# Patient Record
Sex: Male | Born: 1945
Health system: Southern US, Community
[De-identification: ages and names within clinical notes are randomized; demographics above are authoritative.]

## PROBLEM LIST (undated history)

## (undated) DIAGNOSIS — I998 Other disorder of circulatory system: Secondary | ICD-10-CM

## (undated) DIAGNOSIS — F419 Anxiety disorder, unspecified: Secondary | ICD-10-CM

## (undated) DIAGNOSIS — D649 Anemia, unspecified: Secondary | ICD-10-CM

## (undated) DIAGNOSIS — G629 Polyneuropathy, unspecified: Secondary | ICD-10-CM

## (undated) DIAGNOSIS — E119 Type 2 diabetes mellitus without complications: Secondary | ICD-10-CM

## (undated) DIAGNOSIS — I96 Gangrene, not elsewhere classified: Secondary | ICD-10-CM

## (undated) DIAGNOSIS — I10 Essential (primary) hypertension: Secondary | ICD-10-CM

## (undated) DIAGNOSIS — E785 Hyperlipidemia, unspecified: Secondary | ICD-10-CM

## (undated) DIAGNOSIS — I70229 Atherosclerosis of native arteries of extremities with rest pain, unspecified extremity: Secondary | ICD-10-CM

## (undated) DIAGNOSIS — C801 Malignant (primary) neoplasm, unspecified: Secondary | ICD-10-CM

## (undated) HISTORY — DX: Other disorder of circulatory system: I99.8

## (undated) HISTORY — DX: Essential (primary) hypertension: I10

## (undated) HISTORY — PX: ORIF HUMERUS FRACTURE: SHX2126

## (undated) HISTORY — PX: CARDIAC ELECTROPHYSIOLOGY STUDY AND ABLATION: SHX1294

## (undated) HISTORY — PX: CHOLECYSTECTOMY: SHX55

## (undated) HISTORY — DX: Gangrene, not elsewhere classified: I96

## (undated) HISTORY — DX: Type 2 diabetes mellitus without complications: E11.9

## (undated) HISTORY — DX: Malignant (primary) neoplasm, unspecified: C80.1

## (undated) HISTORY — DX: Hyperlipidemia, unspecified: E78.5

## (undated) HISTORY — DX: Atherosclerosis of native arteries of extremities with rest pain, unspecified extremity: I70.229

---

## 2011-11-04 DIAGNOSIS — C61 Malignant neoplasm of prostate: Secondary | ICD-10-CM | POA: Insufficient documentation

## 2011-11-04 DIAGNOSIS — N401 Enlarged prostate with lower urinary tract symptoms: Secondary | ICD-10-CM | POA: Insufficient documentation

## 2011-11-04 DIAGNOSIS — R310 Gross hematuria: Secondary | ICD-10-CM | POA: Insufficient documentation

## 2011-11-04 DIAGNOSIS — N281 Cyst of kidney, acquired: Secondary | ICD-10-CM | POA: Insufficient documentation

## 2013-11-09 DIAGNOSIS — I4892 Unspecified atrial flutter: Secondary | ICD-10-CM | POA: Insufficient documentation

## 2013-11-09 DIAGNOSIS — R0602 Shortness of breath: Secondary | ICD-10-CM | POA: Insufficient documentation

## 2014-01-04 ENCOUNTER — Ambulatory Visit: Payer: Self-pay | Admitting: Cardiovascular Disease

## 2014-01-31 ENCOUNTER — Ambulatory Visit: Payer: Self-pay | Admitting: Cardiovascular Disease

## 2014-12-19 DIAGNOSIS — I96 Gangrene, not elsewhere classified: Secondary | ICD-10-CM

## 2014-12-19 HISTORY — DX: Gangrene, not elsewhere classified: I96

## 2015-01-02 ENCOUNTER — Telehealth: Payer: Self-pay | Admitting: Cardiovascular Disease

## 2015-01-03 ENCOUNTER — Ambulatory Visit: Payer: Self-pay | Admitting: Cardiovascular Disease

## 2015-01-08 NOTE — Telephone Encounter (Signed)
Closed encounter °

## 2015-01-09 ENCOUNTER — Encounter: Payer: Self-pay | Admitting: Cardiovascular Disease

## 2015-01-09 ENCOUNTER — Ambulatory Visit (INDEPENDENT_AMBULATORY_CARE_PROVIDER_SITE_OTHER): Payer: Medicare Other | Admitting: Cardiovascular Disease

## 2015-01-09 VITALS — BP 116/60 | HR 92 | Ht 67.0 in | Wt 221.0 lb

## 2015-01-09 DIAGNOSIS — I70229 Atherosclerosis of native arteries of extremities with rest pain, unspecified extremity: Secondary | ICD-10-CM

## 2015-01-09 DIAGNOSIS — Z7982 Long term (current) use of aspirin: Secondary | ICD-10-CM

## 2015-01-09 DIAGNOSIS — I998 Other disorder of circulatory system: Secondary | ICD-10-CM

## 2015-01-09 DIAGNOSIS — Z79899 Other long term (current) drug therapy: Secondary | ICD-10-CM

## 2015-01-09 DIAGNOSIS — I10 Essential (primary) hypertension: Secondary | ICD-10-CM

## 2015-01-09 DIAGNOSIS — E785 Hyperlipidemia, unspecified: Secondary | ICD-10-CM

## 2015-01-09 DIAGNOSIS — D689 Coagulation defect, unspecified: Secondary | ICD-10-CM

## 2015-01-09 NOTE — Patient Instructions (Signed)
Dr. Gwenlyn Found has ordered a peripheral angiogram to be done at [facility] (thursday, 3/24).  This procedure is going to look at the bloodflow in your lower extremities.  If Dr. Gwenlyn Found is able to open up the arteries, you will have to spend one night in the hospital.  If he is not able to open the arteries, you will be able to go home that same day.    After the procedure, you will not be allowed to drive for 3 days or push, pull, or lift anything greater than 10 lbs for one week.    You will be required to have the following tests prior to the procedure:  1. Blood work-the blood work can be done no more than 7 days prior to the procedure.  It can be done at any [facility] lab.  There is one downstairs on the first floor of this building and one in the [place] ([address])  Reps: TBA  lower extremity arterial doppler (to be done tomorrow, March 23)- During this test, ultrasound is used to evaluate arterial blood flow in the legs. Allow approximately one hour for this exam.

## 2015-01-09 NOTE — Assessment & Plan Note (Signed)
History of hyperlipidemia on some statin 40 mg a day followed by his PCP 

## 2015-01-09 NOTE — Assessment & Plan Note (Signed)
History of hypertension blood pressure measured at 116/60. He is on chlorthalidone, losartan and metoprolol. Continue current meds at current dosing

## 2015-01-09 NOTE — Assessment & Plan Note (Signed)
History of critical limb ischemia with ulcer beginning on his left great toe possibly 2 months ago and his right fifth toe. He was referred by Dr. Barkley Bruns from [facility] for peripheral vascular evaluation. I'm going to get arterial Dopplers today and arrange for him to undergo angiography and potential intervention for limb salvage on Thursday

## 2015-01-09 NOTE — Progress Notes (Signed)
01/09/2015 [name] [name]   Sep 15, 1946  [id number]  Primary Physician No primary care provider on file. Primary Cardiologist: Lorretta Harp MD Renae Gloss   HPI:  [name] [name] is an unfortunate 69 year old moderately overweight single Caucasian male with no children who lives alone and is retired Optometrist for [employer]. He was referred by Dr. Barkley Bruns, podiatrist at [facility], for evaluation of critical limb ischemia. He has a history of treated hypertension, type diabetes and hyperlipidemia. He has never had a heart attack or stroke. He developed wounds on his left great toe positive months ago and on his right fifth toe dorsal surface. His toe appears to be gangrenous and infected. It has a foul stench. And potential intervention for limb salvage the following day.   Current Outpatient Prescriptions  Medication Sig Dispense Refill  . alprazolam (XANAX) 2 MG tablet Take 2 mg by mouth at bedtime.    Marland Kitchen aspirin EC 81 MG tablet Take 81 mg by mouth daily.    . cephALEXin (KEFLEX) 500 MG capsule 1,000 mg 2 (two) times daily.    . chlorthalidone (HYGROTON) 25 MG tablet Take 25 mg by mouth daily.    . citalopram (CELEXA) 40 MG tablet Take 40 mg by mouth daily.    . finasteride (PROSCAR) 5 MG tablet Take 5 mg by mouth daily.    . folic acid (FOLVITE) A999333 MCG tablet Take 400 mcg by mouth daily.    Marland Kitchen gabapentin (NEURONTIN) 100 MG capsule Take 100 mg by mouth 3 (three) times daily.    Marland Kitchen HYDROcodone-acetaminophen (NORCO/VICODIN) 5-325 MG per tablet as needed.    Marland Kitchen losartan (COZAAR) 100 MG tablet Take 100 mg by mouth daily.    . metFORMIN (GLUCOPHAGE) 500 MG tablet Take 500 mg by mouth 2 (two) times daily.    . metoprolol (LOPRESSOR) 100 MG tablet Take 100 mg by mouth 2 (two) times daily.    . Multiple Vitamin (MULTIVITAMIN) capsule Take 1 capsule by mouth daily.    . Omega-3 Fatty Acids (FISH OIL) 1000 MG CAPS Take 1 capsule by mouth daily.    . simvastatin (ZOCOR)  40 MG tablet Take 40 mg by mouth daily.    . tamsulosin (FLOMAX) 0.4 MG CAPS capsule Take 0.4 mg by mouth daily.    . traZODone (DESYREL) 50 MG tablet Take 50 mg by mouth at bedtime.    . vitamin E 400 UNIT capsule Take 400 Units by mouth daily.     No current facility-administered medications for this visit.    No Known Allergies  History   Social History  . Marital Status: Single    Spouse Name: N/A  . Number of Children: N/A  . Years of Education: N/A   Occupational History  . Not on file.   Social History Main Topics  . Smoking status: Never Smoker   . Smokeless tobacco: Not on file  . Alcohol Use: Not on file  . Drug Use: Not on file  . Sexual Activity: Not on file   Other Topics Concern  . Not on file   Social History Narrative  . No narrative on file     Review of Systems: General: negative for chills, fever, night sweats or weight changes.  Cardiovascular: negative for chest pain, dyspnea on exertion, edema, orthopnea, palpitations, paroxysmal nocturnal dyspnea or shortness of breath Dermatological: negative for rash Respiratory: negative for cough or wheezing Urologic: negative for hematuria Abdominal: negative for nausea, vomiting,  diarrhea, bright red blood per rectum, melena, or hematemesis Neurologic: negative for visual changes, syncope, or dizziness All other systems reviewed and are otherwise negative except as noted above.    Blood pressure 116/60, pulse 92, height 5\' 7"  (1.702 m), weight 221 lb (100.245 kg).  General appearance: alert and no distress Neck: no adenopathy, no carotid bruit, no JVD, supple, symmetrical, trachea midline and thyroid not enlarged, symmetric, no tenderness/mass/nodules Lungs: clear to auscultation bilaterally Heart: regular rate and rhythm, S1, S2 normal, no murmur, click, rub or gallop Extremities: extremities normal, atraumatic, no cyanosis or edema  EKG not performed today  ASSESSMENT AND PLAN:    Hyperlipidemia History of hyperlipidemia on some statin 40 mg a day followed by his PCP   Essential hypertension History of hypertension blood pressure measured at 116/60. He is on chlorthalidone, losartan and metoprolol. Continue current meds at current dosing   Critical lower limb ischemia History of critical limb ischemia with ulcer beginning on his left great toe possibly 2 months ago and his right fifth toe. He was referred by Dr. Barkley Bruns from [facility] for peripheral vascular evaluation. I'm going to get arterial Dopplers today and arrange for him to undergo angiography and potential intervention for limb salvage on Thursday       Lorretta Harp MD [facility] 01/09/2015 11:59 AM

## 2015-01-10 ENCOUNTER — Ambulatory Visit (HOSPITAL_BASED_OUTPATIENT_CLINIC_OR_DEPARTMENT_OTHER)
Admission: RE | Admit: 2015-01-10 | Discharge: 2015-01-10 | Disposition: A | Discharge status: Home / Self Care | Payer: Medicare Other | Source: Ambulatory Visit | Attending: Cardiovascular Disease | Admitting: Cardiovascular Disease

## 2015-01-10 DIAGNOSIS — A419 Sepsis, unspecified organism: Principal | ICD-10-CM | POA: Diagnosis present

## 2015-01-10 DIAGNOSIS — I998 Other disorder of circulatory system: Secondary | ICD-10-CM | POA: Diagnosis not present

## 2015-01-10 DIAGNOSIS — E1165 Type 2 diabetes mellitus with hyperglycemia: Secondary | ICD-10-CM | POA: Diagnosis present

## 2015-01-10 DIAGNOSIS — E1152 Type 2 diabetes mellitus with diabetic peripheral angiopathy with gangrene: Secondary | ICD-10-CM | POA: Insufficient documentation

## 2015-01-10 DIAGNOSIS — I10 Essential (primary) hypertension: Secondary | ICD-10-CM | POA: Insufficient documentation

## 2015-01-10 DIAGNOSIS — A480 Gas gangrene: Secondary | ICD-10-CM | POA: Diagnosis present

## 2015-01-10 DIAGNOSIS — N183 Chronic kidney disease, stage 3 (moderate): Secondary | ICD-10-CM | POA: Diagnosis present

## 2015-01-10 DIAGNOSIS — I96 Gangrene, not elsewhere classified: Secondary | ICD-10-CM | POA: Diagnosis not present

## 2015-01-10 DIAGNOSIS — N179 Acute kidney failure, unspecified: Secondary | ICD-10-CM | POA: Diagnosis present

## 2015-01-10 DIAGNOSIS — Z79899 Other long term (current) drug therapy: Secondary | ICD-10-CM

## 2015-01-10 DIAGNOSIS — L97529 Non-pressure chronic ulcer of other part of left foot with unspecified severity: Secondary | ICD-10-CM | POA: Insufficient documentation

## 2015-01-10 DIAGNOSIS — E1169 Type 2 diabetes mellitus with other specified complication: Secondary | ICD-10-CM | POA: Diagnosis present

## 2015-01-10 DIAGNOSIS — I129 Hypertensive chronic kidney disease with stage 1 through stage 4 chronic kidney disease, or unspecified chronic kidney disease: Secondary | ICD-10-CM | POA: Diagnosis present

## 2015-01-10 DIAGNOSIS — E785 Hyperlipidemia, unspecified: Secondary | ICD-10-CM | POA: Diagnosis present

## 2015-01-10 DIAGNOSIS — I70229 Atherosclerosis of native arteries of extremities with rest pain, unspecified extremity: Secondary | ICD-10-CM

## 2015-01-10 DIAGNOSIS — Z22322 Carrier or suspected carrier of Methicillin resistant Staphylococcus aureus: Secondary | ICD-10-CM

## 2015-01-10 DIAGNOSIS — M869 Osteomyelitis, unspecified: Secondary | ICD-10-CM | POA: Diagnosis present

## 2015-01-10 DIAGNOSIS — C61 Malignant neoplasm of prostate: Secondary | ICD-10-CM | POA: Diagnosis present

## 2015-01-10 DIAGNOSIS — E876 Hypokalemia: Secondary | ICD-10-CM | POA: Diagnosis present

## 2015-01-10 DIAGNOSIS — E11621 Type 2 diabetes mellitus with foot ulcer: Secondary | ICD-10-CM | POA: Insufficient documentation

## 2015-01-10 DIAGNOSIS — E860 Dehydration: Secondary | ICD-10-CM | POA: Diagnosis present

## 2015-01-10 DIAGNOSIS — I480 Paroxysmal atrial fibrillation: Secondary | ICD-10-CM | POA: Diagnosis present

## 2015-01-10 DIAGNOSIS — L03115 Cellulitis of right lower limb: Secondary | ICD-10-CM | POA: Diagnosis present

## 2015-01-10 DIAGNOSIS — Z7982 Long term (current) use of aspirin: Secondary | ICD-10-CM

## 2015-01-10 DIAGNOSIS — I739 Peripheral vascular disease, unspecified: Secondary | ICD-10-CM | POA: Diagnosis present

## 2015-01-10 LAB — APTT: aPTT: 47 seconds — ABNORMAL HIGH (ref 24–37)

## 2015-01-10 LAB — CBC
HCT: 35.7 % — ABNORMAL LOW (ref 39.0–52.0)
Hemoglobin: 12.1 g/dL — ABNORMAL LOW (ref 13.0–17.0)
MCH: 29.6 pg (ref 26.0–34.0)
MCHC: 33.9 g/dL (ref 30.0–36.0)
MCV: 87.3 fL (ref 78.0–100.0)
MPV: 9.6 fL (ref 8.6–12.4)
Platelets: 292 10*3/uL (ref 150–400)
RBC: 4.09 MIL/uL — ABNORMAL LOW (ref 4.22–5.81)
RDW: 13.4 % (ref 11.5–15.5)
WBC: 19 10*3/uL — ABNORMAL HIGH (ref 4.0–10.5)

## 2015-01-10 LAB — BASIC METABOLIC PANEL
BUN: 29 mg/dL — ABNORMAL HIGH (ref 6–23)
CALCIUM: 8.9 mg/dL (ref 8.4–10.5)
CO2: 24 meq/L (ref 19–32)
Chloride: 93 mEq/L — ABNORMAL LOW (ref 96–112)
Creat: 1.54 mg/dL — ABNORMAL HIGH (ref 0.50–1.35)
GLUCOSE: 263 mg/dL — AB (ref 70–99)
POTASSIUM: 4.1 meq/L (ref 3.5–5.3)
SODIUM: 134 meq/L — AB (ref 135–145)

## 2015-01-10 LAB — TSH: TSH: 4.138 u[IU]/mL (ref 0.350–4.500)

## 2015-01-10 LAB — PROTIME-INR
INR: 1.27 (ref <1.50)
Prothrombin Time: 15.9 seconds — ABNORMAL HIGH (ref 11.6–15.2)

## 2015-01-10 NOTE — Progress Notes (Signed)
Arterial Duplex Lower Ext. Completed. Normal ABIs with no evidence of stenosis noted. Oda Cogan, BS, RDMS, RVT

## 2015-01-11 ENCOUNTER — Encounter: Payer: Self-pay | Admitting: Vascular Surgery

## 2015-01-11 ENCOUNTER — Inpatient Hospital Stay (HOSPITAL_COMMUNITY)
Admission: EM | Admit: 2015-01-11 | Discharge: 2015-01-15 | DRG: 853 | Disposition: A | Discharge status: Skilled Nursing Facility | Payer: Medicare Other | Attending: Internal Medicine | Admitting: Internal Medicine

## 2015-01-11 ENCOUNTER — Other Ambulatory Visit (HOSPITAL_COMMUNITY): Payer: Self-pay

## 2015-01-11 ENCOUNTER — Ambulatory Visit (INDEPENDENT_AMBULATORY_CARE_PROVIDER_SITE_OTHER): Payer: Medicare Other | Admitting: Vascular Surgery

## 2015-01-11 ENCOUNTER — Ambulatory Visit (HOSPITAL_COMMUNITY): Admission: RE | Admit: 2015-01-11 | Payer: Medicare Other | Source: Ambulatory Visit | Admitting: Cardiovascular Disease

## 2015-01-11 ENCOUNTER — Encounter (HOSPITAL_COMMUNITY): Admission: RE | Payer: Self-pay | Source: Ambulatory Visit

## 2015-01-11 ENCOUNTER — Encounter (HOSPITAL_COMMUNITY): Payer: Self-pay | Admitting: Neurology

## 2015-01-11 ENCOUNTER — Emergency Department (HOSPITAL_COMMUNITY): Payer: Medicare Other

## 2015-01-11 VITALS — BP 100/71 | HR 123 | Temp 98.3°F | Resp 18 | Ht 67.0 in | Wt 216.0 lb

## 2015-01-11 DIAGNOSIS — M869 Osteomyelitis, unspecified: Secondary | ICD-10-CM | POA: Diagnosis present

## 2015-01-11 DIAGNOSIS — E1165 Type 2 diabetes mellitus with hyperglycemia: Secondary | ICD-10-CM

## 2015-01-11 DIAGNOSIS — A480 Gas gangrene: Secondary | ICD-10-CM | POA: Diagnosis present

## 2015-01-11 DIAGNOSIS — L97529 Non-pressure chronic ulcer of other part of left foot with unspecified severity: Secondary | ICD-10-CM | POA: Diagnosis present

## 2015-01-11 DIAGNOSIS — I998 Other disorder of circulatory system: Secondary | ICD-10-CM

## 2015-01-11 DIAGNOSIS — Z7982 Long term (current) use of aspirin: Secondary | ICD-10-CM | POA: Diagnosis not present

## 2015-01-11 DIAGNOSIS — I129 Hypertensive chronic kidney disease with stage 1 through stage 4 chronic kidney disease, or unspecified chronic kidney disease: Secondary | ICD-10-CM | POA: Diagnosis present

## 2015-01-11 DIAGNOSIS — E785 Hyperlipidemia, unspecified: Secondary | ICD-10-CM | POA: Diagnosis present

## 2015-01-11 DIAGNOSIS — N179 Acute kidney failure, unspecified: Secondary | ICD-10-CM | POA: Diagnosis present

## 2015-01-11 DIAGNOSIS — I4891 Unspecified atrial fibrillation: Secondary | ICD-10-CM | POA: Diagnosis not present

## 2015-01-11 DIAGNOSIS — I96 Gangrene, not elsewhere classified: Secondary | ICD-10-CM

## 2015-01-11 DIAGNOSIS — N183 Chronic kidney disease, stage 3 (moderate): Secondary | ICD-10-CM | POA: Diagnosis present

## 2015-01-11 DIAGNOSIS — A419 Sepsis, unspecified organism: Secondary | ICD-10-CM | POA: Diagnosis present

## 2015-01-11 DIAGNOSIS — T148XXA Other injury of unspecified body region, initial encounter: Secondary | ICD-10-CM

## 2015-01-11 DIAGNOSIS — E1140 Type 2 diabetes mellitus with diabetic neuropathy, unspecified: Secondary | ICD-10-CM | POA: Diagnosis not present

## 2015-01-11 DIAGNOSIS — E860 Dehydration: Secondary | ICD-10-CM | POA: Diagnosis present

## 2015-01-11 DIAGNOSIS — E1152 Type 2 diabetes mellitus with diabetic peripheral angiopathy with gangrene: Secondary | ICD-10-CM | POA: Diagnosis present

## 2015-01-11 DIAGNOSIS — E1169 Type 2 diabetes mellitus with other specified complication: Secondary | ICD-10-CM | POA: Diagnosis present

## 2015-01-11 DIAGNOSIS — L089 Local infection of the skin and subcutaneous tissue, unspecified: Secondary | ICD-10-CM

## 2015-01-11 DIAGNOSIS — E876 Hypokalemia: Secondary | ICD-10-CM | POA: Diagnosis present

## 2015-01-11 DIAGNOSIS — I70229 Atherosclerosis of native arteries of extremities with rest pain, unspecified extremity: Secondary | ICD-10-CM | POA: Diagnosis present

## 2015-01-11 DIAGNOSIS — Z22322 Carrier or suspected carrier of Methicillin resistant Staphylococcus aureus: Secondary | ICD-10-CM | POA: Diagnosis not present

## 2015-01-11 DIAGNOSIS — I480 Paroxysmal atrial fibrillation: Secondary | ICD-10-CM | POA: Diagnosis present

## 2015-01-11 DIAGNOSIS — L03119 Cellulitis of unspecified part of limb: Secondary | ICD-10-CM

## 2015-01-11 DIAGNOSIS — C61 Malignant neoplasm of prostate: Secondary | ICD-10-CM | POA: Diagnosis present

## 2015-01-11 DIAGNOSIS — L03115 Cellulitis of right lower limb: Secondary | ICD-10-CM | POA: Diagnosis present

## 2015-01-11 DIAGNOSIS — T148 Other injury of unspecified body region: Secondary | ICD-10-CM

## 2015-01-11 DIAGNOSIS — I739 Peripheral vascular disease, unspecified: Secondary | ICD-10-CM | POA: Diagnosis present

## 2015-01-11 DIAGNOSIS — L24A9 Irritant contact dermatitis due friction or contact with other specified body fluids: Secondary | ICD-10-CM

## 2015-01-11 DIAGNOSIS — N189 Chronic kidney disease, unspecified: Secondary | ICD-10-CM

## 2015-01-11 DIAGNOSIS — Z79899 Other long term (current) drug therapy: Secondary | ICD-10-CM | POA: Diagnosis not present

## 2015-01-11 DIAGNOSIS — IMO0002 Reserved for concepts with insufficient information to code with codable children: Secondary | ICD-10-CM | POA: Diagnosis present

## 2015-01-11 DIAGNOSIS — E11628 Type 2 diabetes mellitus with other skin complications: Secondary | ICD-10-CM | POA: Diagnosis present

## 2015-01-11 DIAGNOSIS — I10 Essential (primary) hypertension: Secondary | ICD-10-CM | POA: Diagnosis present

## 2015-01-11 LAB — BASIC METABOLIC PANEL
ANION GAP: 16 — AB (ref 5–15)
BUN: 39 mg/dL — ABNORMAL HIGH (ref 6–23)
CALCIUM: 8.5 mg/dL (ref 8.4–10.5)
CO2: 24 mmol/L (ref 19–32)
Chloride: 93 mmol/L — ABNORMAL LOW (ref 96–112)
Creatinine, Ser: 1.74 mg/dL — ABNORMAL HIGH (ref 0.50–1.35)
GFR calc Af Amer: 44 mL/min — ABNORMAL LOW (ref >90)
GFR calc non Af Amer: 38 mL/min — ABNORMAL LOW (ref >90)
Glucose, Bld: 241 mg/dL — ABNORMAL HIGH (ref 70–99)
POTASSIUM: 3.5 mmol/L (ref 3.5–5.1)
SODIUM: 133 mmol/L — AB (ref 135–145)

## 2015-01-11 LAB — I-STAT TROPONIN, ED: TROPONIN I, POC: 0.02 ng/mL (ref 0.00–0.08)

## 2015-01-11 LAB — CBC
HCT: 35.4 % — ABNORMAL LOW (ref 39.0–52.0)
HEMOGLOBIN: 12.3 g/dL — AB (ref 13.0–17.0)
MCH: 29.7 pg (ref 26.0–34.0)
MCHC: 34.7 g/dL (ref 30.0–36.0)
MCV: 85.5 fL (ref 78.0–100.0)
PLATELETS: 295 10*3/uL (ref 150–400)
RBC: 4.14 MIL/uL — AB (ref 4.22–5.81)
RDW: 13.6 % (ref 11.5–15.5)
WBC: 20.6 10*3/uL — AB (ref 4.0–10.5)

## 2015-01-11 LAB — I-STAT CG4 LACTIC ACID, ED
Lactic Acid, Venous: 3.11 mmol/L (ref 0.5–2.0)
Lactic Acid, Venous: 1.81 mmol/L (ref 0.5–2.0)

## 2015-01-11 LAB — GLUCOSE, CAPILLARY: Glucose-Capillary: 310 mg/dL — ABNORMAL HIGH (ref 70–99)

## 2015-01-11 SURGERY — ANGIOGRAM, LOWER EXTREMITY
Anesthesia: LOCAL

## 2015-01-11 MED ORDER — INSULIN ASPART 100 UNIT/ML ~~LOC~~ SOLN
0.0000 [IU] | Freq: Three times a day (TID) | SUBCUTANEOUS | Status: DC
  Administered 2015-01-12: 1 [IU] via SUBCUTANEOUS
  Administered 2015-01-13: 2 [IU] via SUBCUTANEOUS
  Administered 2015-01-13: 5 [IU] via SUBCUTANEOUS
  Administered 2015-01-13: 1 [IU] via SUBCUTANEOUS
  Administered 2015-01-14: 2 [IU] via SUBCUTANEOUS
  Administered 2015-01-14 – 2015-01-15 (×2): 3 [IU] via SUBCUTANEOUS
  Administered 2015-01-15: 1 [IU] via SUBCUTANEOUS

## 2015-01-11 MED ORDER — HEPARIN BOLUS VIA INFUSION
4000.0000 [IU] | Freq: Once | INTRAVENOUS | Status: AC
Start: 2015-01-11 — End: 2015-01-12
  Administered 2015-01-12: 4000 [IU] via INTRAVENOUS
  Filled 2015-01-11: qty 4000

## 2015-01-11 MED ORDER — ONDANSETRON HCL 4 MG PO TABS
4.0000 mg | ORAL_TABLET | Freq: Four times a day (QID) | ORAL | Status: DC | PRN
  Administered 2015-01-15: 4 mg via ORAL
  Filled 2015-01-11: qty 1

## 2015-01-11 MED ORDER — VANCOMYCIN HCL 10 G IV SOLR
1250.0000 mg | INTRAVENOUS | Status: DC
  Administered 2015-01-12 – 2015-01-14 (×3): 1250 mg via INTRAVENOUS
  Filled 2015-01-11 (×4): qty 1250

## 2015-01-11 MED ORDER — FOLIC ACID 0.5 MG HALF TAB
500.0000 ug | ORAL_TABLET | Freq: Every day | ORAL | Status: DC
  Administered 2015-01-11 – 2015-01-15 (×5): 0.5 mg via ORAL
  Filled 2015-01-11 (×5): qty 1

## 2015-01-11 MED ORDER — ONDANSETRON HCL 4 MG/2ML IJ SOLN: 4.0000 mg | Freq: Four times a day (QID) | INTRAMUSCULAR | Status: DC | PRN

## 2015-01-11 MED ORDER — SODIUM CHLORIDE 0.9 % IV SOLN
INTRAVENOUS | Status: AC
  Administered 2015-01-11: 23:00:00 via INTRAVENOUS

## 2015-01-11 MED ORDER — VANCOMYCIN HCL 10 G IV SOLR
1750.0000 mg | Freq: Once | INTRAVENOUS | Status: AC
  Administered 2015-01-11: 1750 mg via INTRAVENOUS
  Filled 2015-01-11: qty 1750

## 2015-01-11 MED ORDER — HEPARIN (PORCINE) IN NACL 100-0.45 UNIT/ML-% IJ SOLN
1300.0000 [IU]/h | INTRAMUSCULAR | Status: DC
  Administered 2015-01-12: 1300 [IU]/h via INTRAVENOUS
  Filled 2015-01-11 (×4): qty 250

## 2015-01-11 MED ORDER — SODIUM CHLORIDE 0.9 % IV BOLUS (SEPSIS)
1000.0000 mL | INTRAVENOUS | Status: AC
  Administered 2015-01-11: 1000 mL via INTRAVENOUS

## 2015-01-11 MED ORDER — INSULIN ASPART 100 UNIT/ML ~~LOC~~ SOLN
0.0000 [IU] | Freq: Every day | SUBCUTANEOUS | Status: DC
  Administered 2015-01-11: 4 [IU] via SUBCUTANEOUS
  Administered 2015-01-13: 2 [IU] via SUBCUTANEOUS

## 2015-01-11 MED ORDER — CITALOPRAM HYDROBROMIDE 40 MG PO TABS
40.0000 mg | ORAL_TABLET | Freq: Every day | ORAL | Status: DC
  Administered 2015-01-11 – 2015-01-15 (×5): 40 mg via ORAL
  Filled 2015-01-11 (×5): qty 1

## 2015-01-11 MED ORDER — SIMVASTATIN 40 MG PO TABS
40.0000 mg | ORAL_TABLET | Freq: Every day | ORAL | Status: DC
  Administered 2015-01-12 – 2015-01-14 (×3): 40 mg via ORAL
  Filled 2015-01-11 (×5): qty 1

## 2015-01-11 MED ORDER — HEPARIN SODIUM (PORCINE) 5000 UNIT/ML IJ SOLN
5000.0000 [IU] | Freq: Three times a day (TID) | INTRAMUSCULAR | Status: DC
Start: 2015-01-11 — End: 2015-01-11

## 2015-01-11 MED ORDER — PIPERACILLIN-TAZOBACTAM 3.375 G IVPB 30 MIN
3.3750 g | Freq: Once | INTRAVENOUS | Status: AC
  Administered 2015-01-11: 3.375 g via INTRAVENOUS
  Filled 2015-01-11: qty 50

## 2015-01-11 MED ORDER — FOLIC ACID 400 MCG PO TABS: 400.0000 ug | ORAL_TABLET | Freq: Every day | ORAL | Status: DC

## 2015-01-11 MED ORDER — SODIUM CHLORIDE 0.9 % IJ SOLN
3.0000 mL | Freq: Two times a day (BID) | INTRAMUSCULAR | Status: DC
  Administered 2015-01-14: 3 mL via INTRAVENOUS

## 2015-01-11 MED ORDER — TAMSULOSIN HCL 0.4 MG PO CAPS
0.4000 mg | ORAL_CAPSULE | Freq: Every day | ORAL | Status: DC
  Administered 2015-01-11 – 2015-01-15 (×5): 0.4 mg via ORAL
  Filled 2015-01-11 (×5): qty 1

## 2015-01-11 MED ORDER — ALBUTEROL SULFATE (2.5 MG/3ML) 0.083% IN NEBU: 2.5000 mg | INHALATION_SOLUTION | RESPIRATORY_TRACT | Status: DC | PRN

## 2015-01-11 MED ORDER — ACETAMINOPHEN 325 MG PO TABS: 650.0000 mg | ORAL_TABLET | Freq: Four times a day (QID) | ORAL | Status: DC | PRN

## 2015-01-11 MED ORDER — ASPIRIN EC 81 MG PO TBEC
81.0000 mg | DELAYED_RELEASE_TABLET | Freq: Every day | ORAL | Status: DC
  Administered 2015-01-11 – 2015-01-15 (×5): 81 mg via ORAL
  Filled 2015-01-11 (×5): qty 1

## 2015-01-11 MED ORDER — ALPRAZOLAM 0.5 MG PO TABS
2.0000 mg | ORAL_TABLET | Freq: Every day | ORAL | Status: DC
  Administered 2015-01-11 – 2015-01-14 (×4): 2 mg via ORAL
  Filled 2015-01-11 (×4): qty 4

## 2015-01-11 MED ORDER — MULTIVITAMINS PO CAPS: 1.0000 | ORAL_CAPSULE | Freq: Every day | ORAL | Status: DC

## 2015-01-11 MED ORDER — ADULT MULTIVITAMIN W/MINERALS CH
1.0000 | ORAL_TABLET | Freq: Every day | ORAL | Status: DC
  Administered 2015-01-11 – 2015-01-15 (×5): 1 via ORAL
  Filled 2015-01-11 (×5): qty 1

## 2015-01-11 MED ORDER — ACETAMINOPHEN 650 MG RE SUPP: 650.0000 mg | Freq: Four times a day (QID) | RECTAL | Status: DC | PRN

## 2015-01-11 MED ORDER — TRAZODONE HCL 50 MG PO TABS
50.0000 mg | ORAL_TABLET | Freq: Every day | ORAL | Status: DC
  Administered 2015-01-11 – 2015-01-13 (×3): 50 mg via ORAL
  Filled 2015-01-11 (×6): qty 1

## 2015-01-11 MED ORDER — INSULIN GLARGINE 100 UNIT/ML ~~LOC~~ SOLN
15.0000 [IU] | Freq: Every day | SUBCUTANEOUS | Status: DC
  Administered 2015-01-11 – 2015-01-14 (×4): 15 [IU] via SUBCUTANEOUS
  Filled 2015-01-11 (×5): qty 0.15

## 2015-01-11 MED ORDER — METOPROLOL TARTRATE 100 MG PO TABS
100.0000 mg | ORAL_TABLET | Freq: Two times a day (BID) | ORAL | Status: DC
  Administered 2015-01-13 – 2015-01-15 (×5): 100 mg via ORAL
  Filled 2015-01-11 (×8): qty 1

## 2015-01-11 MED ORDER — GABAPENTIN 100 MG PO CAPS
100.0000 mg | ORAL_CAPSULE | Freq: Three times a day (TID) | ORAL | Status: DC
  Administered 2015-01-11 – 2015-01-15 (×10): 100 mg via ORAL
  Filled 2015-01-11 (×13): qty 1

## 2015-01-11 NOTE — H&P (Addendum)
History and Physical  [name] [name] [id number] DOB: 05-May-1946 DOA: 01/11/2015  Referring physician: Montine Circle, ED PA-C PCP: Monico Blitz, MD  Outpatient Specialists:  1. Podiatry  Chief Complaint: Foul-smelling left foot wound & fever.  HPI: [name] [name] is a 69 y.o. male  with history of type II DM with peripheral neuropathy, essential hypertension, hyperlipidemia, stage I prostate cancer, atrial flutter status post ablation a year ago for which she was briefly on Xarelto, sent to the ED from vascular surgery/Dr. Chen's office for further evaluation of left foot osteomyelitis and cellulitis. Patient states that he lacks sensation in both feet for the last 5 years. Sometime a month ago, he sustained an injury to the left foot which she did not realize until he started noticing foul smell, purulent drainage admixed with blood. This progressively got worse over the next few weeks. He denies pain. He eventually saw a podiatrist Dr. Barkley Bruns who referred him to see Dr. Quay Burow whom he saw on 3/22 and plans were to get arterial Dopplers and arrange for him to undergo angiography and potential intervention for limb salvage today. He was then referred to see Dr. Bridgett Larsson home he did today and was sent to the ED for evaluation. Patient requested a second opinion and EDP has consulted orthopedics/Dr. Ninfa Linden. Per EDP, patient was transiently in A. fib with RVR and spontaneously reverted to sinus rhythm. Patient denies chest pain, dyspnea, palpitations, dizziness. He states that he had a fever of 102F 2 days ago. He denies any other complaints. In the ED, blood glucose was 241, creatinine 1.74 WBC 20.6, hemoglobin 12.3, x-ray of left foot shows osteomyelitis of the proximal and distal phalanges of the great toe and proximal and middle phalanges of the second toe and gas gangrene of the forefoot. X-rays of the right foot show no radiographic evidence of osteomyelitis. Hospitalist admission  requested.  Review of Systems: All systems reviewed and apart from history of presenting illness, are negative.  Past Medical History  Diagnosis Date  . Essential hypertension   . Hyperlipidemia   . Type 2 diabetes mellitus   . Critical lower limb ischemia   . Gangrene of foot March 2016    Left foot  . Cancer     Perostate- Stage I   History reviewed. No pertinent past surgical history. Social History:  reports that he has never smoked. He has never used smokeless tobacco. He reports that he does not drink alcohol or use illicit drugs. Single. Lives alone. Independent of activities of daily living.  No Known Allergies  Family History  Problem Relation Age of Onset  . Hypertension Father     and kidney failure  . Heart disease Father   . Heart attack Father   . Kidney disease Father   . Heart attack Maternal Grandfather   . Heart attack Paternal Grandfather     Prior to Admission medications   Medication Sig Start Date End Date Taking? Authorizing Provider  alprazolam Duanne Moron) 2 MG tablet Take 2 mg by mouth at bedtime.   Yes Historical Provider, MD  cephALEXin (KEFLEX) 500 MG capsule 1,000 mg 2 (two) times daily. 12/28/14  Yes Historical Provider, MD  HYDROcodone-acetaminophen (NORCO/VICODIN) 5-325 MG per tablet Take 1 tablet by mouth every 4 (four) hours as needed for moderate pain.  01/02/15  Yes Historical Provider, MD  tamsulosin (FLOMAX) 0.4 MG CAPS capsule Take 0.4 mg by mouth daily.   Yes Historical Provider, MD  traZODone (DESYREL) 50 MG tablet Take  50 mg by mouth at bedtime.   Yes Historical Provider, MD  aspirin EC 81 MG tablet Take 81 mg by mouth daily.    Historical Provider, MD  chlorthalidone (HYGROTON) 25 MG tablet Take 25 mg by mouth daily.    Historical Provider, MD  citalopram (CELEXA) 40 MG tablet Take 40 mg by mouth daily.    Historical Provider, MD  folic acid (FOLVITE) A999333 MCG tablet Take 400 mcg by mouth daily.    Historical Provider, MD  gabapentin  (NEURONTIN) 100 MG capsule Take 100 mg by mouth 3 (three) times daily.    Historical Provider, MD  losartan (COZAAR) 100 MG tablet Take 100 mg by mouth daily.    Historical Provider, MD  metFORMIN (GLUCOPHAGE) 500 MG tablet Take 500 mg by mouth 2 (two) times daily.    Historical Provider, MD  metoprolol (LOPRESSOR) 100 MG tablet Take 100 mg by mouth 2 (two) times daily.    Historical Provider, MD  Multiple Vitamin (MULTIVITAMIN) capsule Take 1 capsule by mouth daily.    Historical Provider, MD  Omega-3 Fatty Acids (FISH OIL) 1000 MG CAPS Take 1 capsule by mouth daily.    Historical Provider, MD  simvastatin (ZOCOR) 40 MG tablet Take 40 mg by mouth daily.    Historical Provider, MD  vitamin E 400 UNIT capsule Take 400 Units by mouth daily.    Historical Provider, MD   Physical Exam: Filed Vitals:   01/11/15 1538 01/11/15 1545 01/11/15 1600 01/11/15 1615  BP: 105/79 116/85 115/75 113/73  Pulse: 112 109 103 107  Temp:      TempSrc:      Resp: 11 15 14 21   SpO2: 99% 99% 98% 98%   temperature: 98.71F.   General exam: Moderately built and nourished pleasant middle-aged male patient, lying comfortably supine on the gurney in no obvious distress.  Head, eyes and ENT: Nontraumatic and normocephalic. Pupils equally reacting to light and accommodation. Oral mucosa moist.  Neck: Supple. No JVD, carotid bruit or thyromegaly.  Lymphatics: No lymphadenopathy.  Respiratory system: Clear to auscultation. No increased work of breathing.  Cardiovascular system: S1 and S2 heard, RRR. No JVD, murmurs, gallops, clicks or pedal edema.  Gastrointestinal system: Abdomen is obese/nondistended, soft and nontender. Normal bowel sounds heard. No organomegaly or masses appreciated.  Central nervous system: Alert and oriented. No focal neurological deficits.  Extremities: Symmetric 5 x 5 power. Peripheral pulses symmetrically felt except difficulty to appreciate left dorsalis pedis pulsations through  significant swelling. Large necrotic wound covering most of left great toe dorsal and plantar aspect with significant edema and erythema of the dorsum of left foot extending up 2/3rd of the foot. No tenderness. Mild warmth. No fluctuation or crepitus appreciated. Amputation of distal phalanx of right great toe. Dry gangrene of the tip of right second and fourth toe.   Left foot   Left foot   Right foot   Right foot   Left foot   Left foot   Left foot   Right foot   Skin: No otherrashes or acute findings.  Musculoskeletal system: Negative exam.  Psychiatry: Pleasant and cooperative.   Labs on Admission:  Basic Metabolic Panel:  Recent Labs Lab 01/09/15 1332 01/11/15 1402  NA 134* 133*  K 4.1 3.5  CL 93* 93*  CO2 24 24  GLUCOSE 263* 241*  BUN 29* 39*  CREATININE 1.54* 1.74*  CALCIUM 8.9 8.5   Liver Function Tests: No results for input(s): AST, ALT, ALKPHOS, BILITOT, PROT,  ALBUMIN in the last 168 hours. No results for input(s): LIPASE, AMYLASE in the last 168 hours. No results for input(s): AMMONIA in the last 168 hours. CBC:  Recent Labs Lab 01/09/15 1332 01/11/15 1402  WBC 19.0* 20.6*  HGB 12.1* 12.3*  HCT 35.7* 35.4*  MCV 87.3 85.5  PLT 292 295   Cardiac Enzymes: No results for input(s): CKTOTAL, CKMB, CKMBINDEX, TROPONINI in the last 168 hours.  BNP (last 3 results) No results for input(s): PROBNP in the last 8760 hours. CBG: No results for input(s): GLUCAP in the last 168 hours.  Radiological Exams on Admission: Dg Foot Complete Left  01/11/2015   CLINICAL DATA:  Cellulitis and open wound on the great toe.  EXAM: LEFT FOOT - COMPLETE 3+ VIEW  COMPARISON:  None.  FINDINGS: There is destruction of the distal aspect of the proximal phalanx of the great toe in the distal phalanx is completely destroyed.  There is also osseous destruction at the PIP joint of the second toe.  There is severe arthritis at the first metatarsal phalangeal joint and  there is gas in the soft tissues overlying the entire first metatarsal suggesting gas gangrene. The soft tissue gas extends onto the plantar aspect of the foot underlying the second and third metatarsals.  There is a prominent Haglund deformity at the Achilles insertion on the calcaneus. There is dorsal spurring at the talonavicular joint.  Prominent soft tissue swelling of forefoot.  IMPRESSION: Osteomyelitis of the proximal and distal phalanges of the great toe. Osteomyelitis of the proximal and middle phalanges of the second toe.  Gas gangrene of the forefoot.   Electronically Signed   By: Lorriane Shire M.D.   On: 01/11/2015 16:48   Dg Foot Complete Right  01/11/2015   CLINICAL DATA:  Cellulitis with open wounds on the first through third toes of the right foot.  EXAM: RIGHT FOOT COMPLETE - 3+ VIEW  COMPARISON:  MRI dated 03/06/2011  FINDINGS: The distal phalanx of the great toe has been amputated. There is severe arthritis of the first metatarsal phalangeal joint. There are no findings of osteomyelitis. There is a Haglund deformity at the insertion of the Achilles tendon on the posterior calcaneus. Small plantar calcaneal spur. Slight dorsal spurring in the midfoot.  IMPRESSION: No radiographic evidence of osteomyelitis.   Electronically Signed   By: Lorriane Shire M.D.   On: 01/11/2015 16:46    EKG: Independently reviewed. A. fib with RVR at 135 bpm and nonspecific ST-T wave changes.  Assessment/Plan Principal Problem:   Osteomyelitis of left foot - EDP has consulted orthopedics-await their input regarding surgical intervention area and will possibly need I&D vs forefoot amputation vs BKA. - Continue IV Zosyn and vancomycin per pharmacy protocol. - Based on available data, patient is at low risk for perioperative CV events and may proceed with indicated orthopedic surgery with close perioperative monitoring. Same was discussed with consulting Orthopedics MD on 3/24.  Active Problems:   Critical  lower limb ischemia - Unable to appreciate clear pulsations and left foot. However as per vascular surgery input, left foot is not salvageable. - Vascular surgeon offered left BKA but patient wanted to have a second opinion and will be seen by orthopedics tonight.  Sepsis - secondary to OM left foot infection - Follow sepsis protocol.  Paroxysmal A. fib with RVR - Patient states that he underwent ablation for atrial flutter in early 2015 by Dr. Georg Ruddle in [place]. Was on Xarelto briefly at that time. -  Monitor on telemetry. - Check TSH and 2-D echo. - Continue metoprolol. - CHA2DS2-VASc Score: 4. Will start IV heparin which can be held prior to surgery. - Eventually will need to go on NOAC    Essential hypertension - Controlled. - Continue metoprolol but hold ARB and diuretic secondary to acute on chronic kidney disease.    Hyperlipidemia - Continue statins.     Diabetic foot infection - Left foot infection management as per above.  - Right foot has some dry gangrene of the tip of second and fourth toe but no overt infection and x-ray negative for osteomyelitis.     DM type 2, uncontrolled, with neuropathy - Chek hemoglobin A1c.  - Place on Lantus and NovoLog sliding scale insulin.     Renal failure, acute on chronic - Secondary to dehydration, ARB complicating underlying chronic kidney disease.  - Baseline creatinine not known but may be in the 1.5 range.     Code Status: Full  Family Communication: None at bedside  Disposition Plan: Home when medically stable   Time spent: 70 minutes.  Vernell Leep, MD, FACP, FHM. [facility] Pager [phone]  If 7PM-7AM, please contact night-coverage [email or web address] Password [facility] 01/11/2015, 6:00 PM

## 2015-01-11 NOTE — Discharge Summary (Deleted)
History and Physical  [name] [name] [id number] DOB: [birth year] DOA: 01/11/2015  Referring physician: Montine Circle, ED PA-C PCP: Monico Blitz, MD  Outpatient Specialists:  1. Podiatry  Chief Complaint: Foul-smelling left foot wound & fever.  HPI: [name] [name] is a 69 y.o. male  with history of type II DM with peripheral neuropathy, essential hypertension, hyperlipidemia, stage I prostate cancer, atrial flutter status post ablation a year ago for which she was briefly on Xarelto, sent to the ED from vascular surgery/Dr. Chen's office for further evaluation of left foot osteomyelitis and cellulitis. Patient states that he lacks sensation in both feet for the last 5 years. Sometime a month ago, he sustained an injury to the left foot which she did not realize until he started noticing foul smell, purulent drainage admixed with blood. This progressively got worse over the next few weeks. He denies pain. He eventually saw a podiatrist Dr. Barkley Bruns who referred him to see Dr. Quay Burow whom he saw on 3/22 and plans were to get arterial Dopplers and arrange for him to undergo angiography and potential intervention for limb salvage today. He was then referred to see Dr. Bridgett Larsson home he did today and was sent to the ED for evaluation. Patient requested a second opinion and EDP has consulted orthopedics/Dr. Ninfa Linden. Per EDP, patient was transiently in A. fib with RVR and spontaneously reverted to sinus rhythm. Patient denies chest pain, dyspnea, palpitations, dizziness. He states that he had a fever of 102F 2 days ago. He denies any other complaints. In the ED, blood glucose was 241, creatinine 1.74 WBC 20.6, hemoglobin 12.3, x-ray of left foot shows osteomyelitis of the proximal and distal phalanges of the great toe and proximal and middle phalanges of the second toe and gas gangrene of the forefoot. X-rays of the right foot show no radiographic evidence of osteomyelitis. Hospitalist admission  requested.  Review of Systems: All systems reviewed and apart from history of presenting illness, are negative.  Past Medical History  Diagnosis Date  . Essential hypertension   . Hyperlipidemia   . Type 2 diabetes mellitus   . Critical lower limb ischemia   . Gangrene of foot March 2016    Left foot  . Cancer     Perostate- Stage I   History reviewed. No pertinent past surgical history. Social History:  reports that he has never smoked. He has never used smokeless tobacco. He reports that he does not drink alcohol or use illicit drugs. Single. Lives alone. Independent of activities of daily living.  No Known Allergies  Family History  Problem Relation Age of Onset  . Hypertension Father     and kidney failure  . Heart disease Father   . Heart attack Father   . Kidney disease Father   . Heart attack Maternal Grandfather   . Heart attack Paternal Grandfather     Prior to Admission medications   Medication Sig Start Date End Date Taking? Authorizing Provider  alprazolam Duanne Moron) 2 MG tablet Take 2 mg by mouth at bedtime.   Yes Historical Provider, MD  cephALEXin (KEFLEX) 500 MG capsule 1,000 mg 2 (two) times daily. 12/28/14  Yes Historical Provider, MD  HYDROcodone-acetaminophen (NORCO/VICODIN) 5-325 MG per tablet Take 1 tablet by mouth every 4 (four) hours as needed for moderate pain.  01/02/15  Yes Historical Provider, MD  tamsulosin (FLOMAX) 0.4 MG CAPS capsule Take 0.4 mg by mouth daily.   Yes Historical Provider, MD  traZODone (DESYREL) 50 MG tablet Take  50 mg by mouth at bedtime.   Yes Historical Provider, MD  aspirin EC 81 MG tablet Take 81 mg by mouth daily.    Historical Provider, MD  chlorthalidone (HYGROTON) 25 MG tablet Take 25 mg by mouth daily.    Historical Provider, MD  citalopram (CELEXA) 40 MG tablet Take 40 mg by mouth daily.    Historical Provider, MD  folic acid (FOLVITE) [id number] MCG tablet Take 400 mcg by mouth daily.    Historical Provider, MD  gabapentin  (NEURONTIN) 100 MG capsule Take 100 mg by mouth 3 (three) times daily.    Historical Provider, MD  losartan (COZAAR) 100 MG tablet Take 100 mg by mouth daily.    Historical Provider, MD  metFORMIN (GLUCOPHAGE) 500 MG tablet Take 500 mg by mouth 2 (two) times daily.    Historical Provider, MD  metoprolol (LOPRESSOR) 100 MG tablet Take 100 mg by mouth 2 (two) times daily.    Historical Provider, MD  Multiple Vitamin (MULTIVITAMIN) capsule Take 1 capsule by mouth daily.    Historical Provider, MD  Omega-3 Fatty Acids (FISH OIL) 1000 MG CAPS Take 1 capsule by mouth daily.    Historical Provider, MD  simvastatin (ZOCOR) 40 MG tablet Take 40 mg by mouth daily.    Historical Provider, MD  vitamin E 400 UNIT capsule Take 400 Units by mouth daily.    Historical Provider, MD   Physical Exam: Filed Vitals:   01/11/15 1538 01/11/15 1545 01/11/15 1600 01/11/15 1615  BP: 105/79 116/85 115/75 113/73  Pulse: 112 109 103 107  Temp:      TempSrc:      Resp: 11 15 14 21   SpO2: 99% 99% 98% 98%   temperature: 98.80F.   General exam: Moderately built and nourished pleasant middle-aged male patient, lying comfortably supine on the gurney in no obvious distress.  Head, eyes and ENT: Nontraumatic and normocephalic. Pupils equally reacting to light and accommodation. Oral mucosa moist.  Neck: Supple. No JVD, carotid bruit or thyromegaly.  Lymphatics: No lymphadenopathy.  Respiratory system: Clear to auscultation. No increased work of breathing.  Cardiovascular system: S1 and S2 heard, RRR. No JVD, murmurs, gallops, clicks or pedal edema.  Gastrointestinal system: Abdomen is obese/nondistended, soft and nontender. Normal bowel sounds heard. No organomegaly or masses appreciated.  Central nervous system: Alert and oriented. No focal neurological deficits.  Extremities: Symmetric 5 x 5 power. Peripheral pulses symmetrically felt except difficulty to appreciate left dorsalis pedis pulsations through  significant swelling. Large necrotic wound covering most of left great toe dorsal and plantar aspect with significant edema and erythema of the dorsum of left foot extending up 2/3rd of the foot. No tenderness. Mild warmth. No fluctuation or crepitus appreciated. Amputation of distal phalanx of right great toe. Dry gangrene of the tip of right second and fourth toe.       Right foot   Right foot   Left foot   Left foot   Left foot   Right foot   Skin: No otherrashes or acute findings.  Musculoskeletal system: Negative exam.  Psychiatry: Pleasant and cooperative.   Labs on Admission:  Basic Metabolic Panel:  Recent Labs Lab 01/09/15 1332 01/11/15 1402  NA 134* 133*  K 4.1 3.5  CL 93* 93*  CO2 24 24  GLUCOSE 263* 241*  BUN 29* 39*  CREATININE 1.54* 1.74*  CALCIUM 8.9 8.5   Liver Function Tests: No results for input(s): AST, ALT, ALKPHOS, BILITOT, PROT, ALBUMIN in the last  168 hours. No results for input(s): LIPASE, AMYLASE in the last 168 hours. No results for input(s): AMMONIA in the last 168 hours. CBC:  Recent Labs Lab 01/09/15 1332 01/11/15 1402  WBC 19.0* 20.6*  HGB 12.1* 12.3*  HCT 35.7* 35.4*  MCV 87.3 85.5  PLT 292 295   Cardiac Enzymes: No results for input(s): CKTOTAL, CKMB, CKMBINDEX, TROPONINI in the last 168 hours.  BNP (last 3 results) No results for input(s): PROBNP in the last 8760 hours. CBG: No results for input(s): GLUCAP in the last 168 hours.  Radiological Exams on Admission: Dg Foot Complete Left  01/11/2015   CLINICAL DATA:  Cellulitis and open wound on the great toe.  EXAM: LEFT FOOT - COMPLETE 3+ VIEW  COMPARISON:  None.  FINDINGS: There is destruction of the distal aspect of the proximal phalanx of the great toe in the distal phalanx is completely destroyed.  There is also osseous destruction at the PIP joint of the second toe.  There is severe arthritis at the first metatarsal phalangeal joint and there is gas in the  soft tissues overlying the entire first metatarsal suggesting gas gangrene. The soft tissue gas extends onto the plantar aspect of the foot underlying the second and third metatarsals.  There is a prominent Haglund deformity at the Achilles insertion on the calcaneus. There is dorsal spurring at the talonavicular joint.  Prominent soft tissue swelling of forefoot.  IMPRESSION: Osteomyelitis of the proximal and distal phalanges of the great toe. Osteomyelitis of the proximal and middle phalanges of the second toe.  Gas gangrene of the forefoot.   Electronically Signed   By: Lorriane Shire M.D.   On: 01/11/2015 16:48   Dg Foot Complete Right  01/11/2015   CLINICAL DATA:  Cellulitis with open wounds on the first through third toes of the right foot.  EXAM: RIGHT FOOT COMPLETE - 3+ VIEW  COMPARISON:  MRI dated 03/06/2011  FINDINGS: The distal phalanx of the great toe has been amputated. There is severe arthritis of the first metatarsal phalangeal joint. There are no findings of osteomyelitis. There is a Haglund deformity at the insertion of the Achilles tendon on the posterior calcaneus. Small plantar calcaneal spur. Slight dorsal spurring in the midfoot.  IMPRESSION: No radiographic evidence of osteomyelitis.   Electronically Signed   By: Lorriane Shire M.D.   On: 01/11/2015 16:46    EKG: Independently reviewed. A. fib with RVR at 135 bpm and nonspecific ST-T wave changes.  Assessment/Plan Principal Problem:   Osteomyelitis of left foot - EDP has consulted orthopedics-await their input regarding surgical intervention area and will possibly need left BKA. - Continue IV Zosyn and vancomycin per pharmacy protocol.  Active Problems:   Critical lower limb ischemia - Unable to appreciate clear pulsations and left foot. However as per vascular surgery input, left foot is not salvageable. - Vascular surgeon offered left BKA but patient wanted to have a second opinion and will be seen by orthopedics  tonight.  Paroxysmal A. fib with RVR - Patient states that he underwent ablation for atrial flutter in early 2015 by Dr. Georg Ruddle in [place]. Was on Xarelto briefly at that time. - Monitor on telemetry. - Check TSH and 2-D echo. - Continue metoprolol. - CHA2DS2-VASc Score: 4. Will start IV heparin which can be held prior to surgery. - Eventually will need to go on NOAC    Essential hypertension - Controlled. - Continue metoprolol but hold ARB and diuretic secondary to acute on chronic  kidney disease.    Hyperlipidemia - Continue statins.     Diabetic foot infection - Left foot infection management as per above.  - Right foot has some dry gangrene of the tip of second and fourth toe but no overt infection and x-ray negative for osteomyelitis.     DM type 2, uncontrolled, with neuropathy - Chek hemoglobin A1c.  - Place on Lantus and NovoLog sliding scale insulin.     Renal failure, acute on chronic - Secondary to dehydration, ARB complicating underlying chronic kidney disease.  - Baseline creatinine not known but may be in the 1.5 range.     Code Status: Full  Family Communication: None at bedside  Disposition Plan: Home when medically stable   Time spent: 70 minutes.  Vernell Leep, MD, FACP, FHM. [facility] Pager [phone]  If 7PM-7AM, please contact night-coverage [email or web address] Password [facility] 01/11/2015, 5:30 PM

## 2015-01-11 NOTE — Progress Notes (Signed)
ANTIBIOTIC CONSULT NOTE - INITIAL  Pharmacy Consult for Vancomycin and Zosyn Indication: rule out sepsis  No Known Allergies  Patient Measurements:   Actual Body Weight: 98 kg  Vital Signs: Temp: 98.6 F (37 C) (03/24 1339) Temp Source: Oral (03/24 1339) BP: 105/79 mmHg (03/24 1538) Pulse Rate: 112 (03/24 1538) Intake/Output from previous day:   Intake/Output from this shift:    Labs:  Recent Labs  01/09/15 1332 01/11/15 1402  WBC 19.0* 20.6*  HGB 12.1* 12.3*  PLT 292 295  CREATININE 1.54* 1.74*   Estimated Creatinine Clearance: 44.7 mL/min (by C-G formula based on Cr of 1.74). No results for input(s): VANCOTROUGH, VANCOPEAK, VANCORANDOM, GENTTROUGH, GENTPEAK, GENTRANDOM, TOBRATROUGH, TOBRAPEAK, TOBRARND, AMIKACINPEAK, AMIKACINTROU, AMIKACIN in the last 72 hours.   Microbiology: No results found for this or any previous visit (from the past 720 hour(s)).  Medical History: Past Medical History  Diagnosis Date  . Essential hypertension   . Hyperlipidemia   . Type 2 diabetes mellitus   . Critical lower limb ischemia   . Gangrene of foot March 2016    Left foot  . Cancer     Perostate- Stage I    Medications:   (Not in a hospital admission) Scheduled:   Infusions:  . piperacillin-tazobactam    . sodium chloride     Assessment: 69yo male with history of DM2, gangrenous L foot, HTN and HLD presents with foot pain. Pharmacy is consulted to dose vancomycin and zosyn for rule out sepsis. Pt is afebrile, WBC 20.6, sCr 1.7, LA 3.1.  Goal of Therapy:  Vancomycin trough level 15-20 mcg/ml  Plan:  Vancomycin 1750mg  IV once followed by 1250mg  IV q24h Zosyn 3.375g IV q8h Measure antibiotic drug levels at steady state Follow up culture results, renal function, and clinical course  Andrey Cota. Diona Foley, PharmD Clinical Pharmacist Pager [phone] 01/11/2015,3:49 PM

## 2015-01-11 NOTE — ED Provider Notes (Signed)
Medical screening examination/treatment/procedure(s) were conducted as a shared visit with non-physician practitioner(s) and myself.  I personally evaluated the patient during the encounter.   EKG Interpretation   Date/Time:  Thursday January 11 2015 13:41:27 EDT Ventricular Rate:  135 PR Interval:    QRS Duration: 78 QT Interval:  334 QTC Calculation: 501 R Axis:   46 Text Interpretation:  Atrial fibrillation with rapid ventricular response  with premature ventricular or aberrantly conducted complexes Nonspecific  ST abnormality Abnormal ECG Confirmed by Liberti Appleton  MD, Claudia Greenley ([id number]) on  01/11/2015 4:12:37 PM     Patient here with left foot gangrene and likely early sepsis. Started on antibiotics. Patient does have it fibrillation with rapid ventricular response which is been treated with IV fluids and his heart rate has decreased. Will consult orthopedics as well as medicine service for admission to stepdown  Lacretia Leigh, MD 01/11/15 [phone]

## 2015-01-11 NOTE — ED Notes (Addendum)
Pt sent here from Dr. Bridgett Larsson for admission for foot ulcers/infection needing amputation. Pt reports told he has gangrene. Pt denies pain. Is ambulatory. Pt reports last night he did have some CP.

## 2015-01-11 NOTE — Consult Note (Signed)
Reason for Consult:  Left foot infection Referring Physician: EDP  [name] [name] is an 69 y.o. male.  HPI:   69 yo diabetic male who was sent to the ED today from the Vascular Surgeon's office due to a severe infection involving his left foot.  He is being admitted to the [facility] due to his diabetes, afib, and due to the fact that he is borderline septic.  Dr. Bridgett Larsson had recommended to the patient that he needs a left below knee amputation and the patient wants a second opinion.  Past Medical History  Diagnosis Date  . Essential hypertension   . Hyperlipidemia   . Type 2 diabetes mellitus   . Critical lower limb ischemia   . Gangrene of foot March 2016    Left foot  . Cancer     Perostate- Stage I    History reviewed. No pertinent past surgical history.  Family History  Problem Relation Age of Onset  . Hypertension Father     and kidney failure  . Heart disease Father   . Heart attack Father   . Kidney disease Father   . Heart attack Maternal Grandfather   . Heart attack Paternal Grandfather     Social History:  reports that he has never smoked. He has never used smokeless tobacco. He reports that he does not drink alcohol or use illicit drugs.  Allergies: No Known Allergies  Medications: I have reviewed the patient's current medications.  Results for orders placed or performed during the hospital encounter of 01/11/15 (from the past 48 hour(s))  CBC     Status: Abnormal   Collection Time: 01/11/15  2:02 PM  Result Value Ref Range   WBC 20.6 (H) 4.0 - 10.5 K/uL   RBC 4.14 (L) 4.22 - 5.81 MIL/uL   Hemoglobin 12.3 (L) 13.0 - 17.0 g/dL   HCT 35.4 (L) 39.0 - 52.0 %   MCV 85.5 78.0 - 100.0 fL   MCH 29.7 26.0 - 34.0 pg   MCHC 34.7 30.0 - 36.0 g/dL   RDW 13.6 11.5 - 15.5 %   Platelets 295 150 - 400 K/uL  Basic metabolic panel     Status: Abnormal   Collection Time: 01/11/15  2:02 PM  Result Value Ref Range   Sodium 133 (L) 135 - 145 mmol/L   Potassium  3.5 3.5 - 5.1 mmol/L   Chloride 93 (L) 96 - 112 mmol/L   CO2 24 19 - 32 mmol/L   Glucose, Bld 241 (H) 70 - 99 mg/dL   BUN 39 (H) 6 - 23 mg/dL   Creatinine, Ser 1.74 (H) 0.50 - 1.35 mg/dL   Calcium 8.5 8.4 - 10.5 mg/dL   GFR calc non Af Amer 38 (L) >90 mL/min   GFR calc Af Amer 44 (L) >90 mL/min    Comment: (NOTE) The eGFR has been calculated using the CKD EPI equation. This calculation has not been validated in all clinical situations. eGFR's persistently <90 mL/min signify possible Chronic Kidney Disease.    Anion gap 16 (H) 5 - 15  I-stat troponin, ED (not at [facility])     Status: None   Collection Time: 01/11/15  2:13 PM  Result Value Ref Range   Troponin i, poc 0.02 0.00 - 0.08 ng/mL   Comment 3            Comment: Due to the release kinetics of cTnI, a negative result within the first hours of the onset of symptoms does  not rule out myocardial infarction with certainty. If myocardial infarction is still suspected, repeat the test at appropriate intervals.   I-Stat CG4 Lactic Acid, ED     Status: Abnormal   Collection Time: 01/11/15  2:15 PM  Result Value Ref Range   Lactic Acid, Venous 3.11 (HH) 0.5 - 2.0 mmol/L   Comment NOTIFIED PHYSICIAN   I-Stat CG4 Lactic Acid, ED     Status: None   Collection Time: 01/11/15  5:09 PM  Result Value Ref Range   Lactic Acid, Venous 1.81 0.5 - 2.0 mmol/L    Dg Foot Complete Left  01/11/2015   CLINICAL DATA:  Cellulitis and open wound on the great toe.  EXAM: LEFT FOOT - COMPLETE 3+ VIEW  COMPARISON:  None.  FINDINGS: There is destruction of the distal aspect of the proximal phalanx of the great toe in the distal phalanx is completely destroyed.  There is also osseous destruction at the PIP joint of the second toe.  There is severe arthritis at the first metatarsal phalangeal joint and there is gas in the soft tissues overlying the entire first metatarsal suggesting gas gangrene. The soft tissue gas extends onto the plantar aspect of the foot  underlying the second and third metatarsals.  There is a prominent Haglund deformity at the Achilles insertion on the calcaneus. There is dorsal spurring at the talonavicular joint.  Prominent soft tissue swelling of forefoot.  IMPRESSION: Osteomyelitis of the proximal and distal phalanges of the great toe. Osteomyelitis of the proximal and middle phalanges of the second toe.  Gas gangrene of the forefoot.   Electronically Signed   By: Lorriane Shire M.D.   On: 01/11/2015 16:48   Dg Foot Complete Right  01/11/2015   CLINICAL DATA:  Cellulitis with open wounds on the first through third toes of the right foot.  EXAM: RIGHT FOOT COMPLETE - 3+ VIEW  COMPARISON:  MRI dated 03/06/2011  FINDINGS: The distal phalanx of the great toe has been amputated. There is severe arthritis of the first metatarsal phalangeal joint. There are no findings of osteomyelitis. There is a Haglund deformity at the insertion of the Achilles tendon on the posterior calcaneus. Small plantar calcaneal spur. Slight dorsal spurring in the midfoot.  IMPRESSION: No radiographic evidence of osteomyelitis.   Electronically Signed   By: Lorriane Shire M.D.   On: 01/11/2015 16:46    ROS Blood pressure 113/73, pulse 107, temperature 98.6 F (37 C), temperature source Oral, resp. rate 21, height _0  (1.702 m), weight 98 kg (216 lb 0.8 oz), SpO2 98 %. Physical Exam  Musculoskeletal:       Feet:  No palpable pulses  Assessment/Plan: Overwhelming necrosis, cellulitis, and deep infection with abscess and osteomyelitis left foot with severe peripheral vascular disease; PVD right foot with small wounds 1)  I spoke to him in length and he understands that he needs a left below knee amputation.  This has also been recommended by Dr. Bridgett Larsson with Vascular Surgery.  His infection is deep and quite severe and he would not heal any surgery at a more distal aspect.  His WBC is quite high and his is on the verge of getting significantly sick/septic.  He  is being admitted by [facility] and being started on IV antibiotics.  I have spoke to the primary team about my plan for surgery tomorrow.  The patient understands this fully as well as well as the urgency of surgery.  He does need minimal debridement  of wounds on his right foot as well, but the left BKA is a more pressing matter.  Mcarthur Rossetti 01/11/2015, 7:18 PM

## 2015-01-11 NOTE — ED Notes (Signed)
Antibiotics started before cultures drawn.  MD aware.

## 2015-01-11 NOTE — ED Notes (Signed)
Lab results reported to Santiago Glad at [facility].

## 2015-01-11 NOTE — Progress Notes (Signed)
ANTICOAGULATION CONSULT NOTE - Initial Consult  Pharmacy Consult for Heparin Indication: atrial fibrillation  No Known Allergies  Patient Measurements: Height: 5\' 7"  (170.2 cm) Weight: 216 lb 0.8 oz (98 kg) IBW/kg (Calculated) : 66.1 Heparin Dosing Weight: 87 kg  Vital Signs: Temp: 98.6 F (37 C) (03/24 1339) Temp Source: Oral (03/24 1339) BP: 113/73 mmHg (03/24 1615) Pulse Rate: 107 (03/24 1615)  Labs:  Recent Labs  01/09/15 1332 01/11/15 1402  HGB 12.1* 12.3*  HCT 35.7* 35.4*  PLT 292 295  APTT 47*  --   LABPROT 15.9*  --   INR 1.27  --   CREATININE 1.54* 1.74*    Estimated Creatinine Clearance: 44.7 mL/min (by C-G formula based on Cr of 1.74).   Medical History: Past Medical History  Diagnosis Date  . Essential hypertension   . Hyperlipidemia   . Type 2 diabetes mellitus   . Critical lower limb ischemia   . Gangrene of foot March 2016    Left foot  . Cancer     Perostate- Stage I    Medications:   (Not in a hospital admission) Scheduled:  . insulin aspart  0-5 Units Subcutaneous QHS  . [START ON 01/12/2015] insulin aspart  0-9 Units Subcutaneous TID WC  . insulin glargine  15 Units Subcutaneous QHS  . metoprolol  100 mg Oral BID   Infusions:  . sodium chloride 1,000 mL (01/11/15 1615)  . vancomycin 1,750 mg (01/11/15 1617)   Followed by  . [START ON 01/12/2015] vancomycin      Assessment: 69yo male with history of Aflutter s/p ablation last year presents with L foot wound and fever. Pharmacy is consulted to dose heparin for atrial fibrillation. Pt was previously on Xarelto following ablation, but completed course last year. Hgb 12.3, Plt 295, Trop neg x1, sCr 1.7.  Goal of Therapy:  Heparin level 0.3-0.7 units/ml Monitor platelets by anticoagulation protocol: Yes   Plan:  Give 4000 units bolus x 1 Start heparin infusion at 1300 units/hr Check anti-Xa level in 6 hours and daily while on heparin Continue to monitor H&H and  platelets  Andrey Cota. Diona Foley, PharmD Clinical Pharmacist Pager [phone] 01/11/2015,6:02 PM

## 2015-01-11 NOTE — Progress Notes (Signed)
Referred by:  Inocencio Homes, DPM [address] [place], [place] [address]  Reason for referral: need for L BKA  History of Present Illness  [name] [name] is a 69 y.o. (1946-06-21) male who presents with chief complaint: painful left foot with foul odor.  Onset of symptom occurred ~one month ago, wounds in right and left foot.  Pain is described as sharp, severity 6-10/10, and associated with rest and manipulation.  Patient has attempted to treat this pain with podiatric care.  The patient was seen by Podiatry and referred to [place] for L BKA.  The podiatrist's foot X-ray were consistent with B osteomyelitis.  The patient is also having sx of pain in his R foot.  He denies any ambulation of extended length.  The patient has no rest pain symptoms.  Atherosclerotic risk factors include: DM, HTN, HLD.  Reportedly this patient was scheduled for an aortogram with bilateral leg runoff with Dr. Gwenlyn Found today.  Past Medical History  Diagnosis Date  . Essential hypertension   . Hyperlipidemia   . Type 2 diabetes mellitus   . Critical lower limb ischemia   . Gangrene of foot March 2016    Left foot  . Cancer     Perostate- Stage I    Past Surgical History: no prior surgery noted  History   Social History  . Marital Status: Single    Spouse Name: N/A  . Number of Children: N/A  . Years of Education: N/A   Occupational History  . Not on file.   Social History Main Topics  . Smoking status: Never Smoker   . Smokeless tobacco: Never Used  . Alcohol Use: No  . Drug Use: No  . Sexual Activity: Not on file   Other Topics Concern  . Not on file   Social History Narrative    Family History  Problem Relation Age of Onset  . Hypertension Father     and kidney failure  . Heart disease Father   . Heart attack Father   . Kidney disease Father   . Heart attack Maternal Grandfather   . Heart attack Paternal Grandfather     Current Outpatient Prescriptions on  File Prior to Visit  Medication Sig Dispense Refill  . alprazolam (XANAX) 2 MG tablet Take 2 mg by mouth at bedtime.    Marland Kitchen aspirin EC 81 MG tablet Take 81 mg by mouth daily.    . cephALEXin (KEFLEX) 500 MG capsule 1,000 mg 2 (two) times daily.    . chlorthalidone (HYGROTON) 25 MG tablet Take 25 mg by mouth daily.    . citalopram (CELEXA) 40 MG tablet Take 40 mg by mouth daily.    . folic acid (FOLVITE) A999333 MCG tablet Take 400 mcg by mouth daily.    Marland Kitchen gabapentin (NEURONTIN) 100 MG capsule Take 100 mg by mouth 3 (three) times daily.    Marland Kitchen HYDROcodone-acetaminophen (NORCO/VICODIN) 5-325 MG per tablet as needed.    Marland Kitchen losartan (COZAAR) 100 MG tablet Take 100 mg by mouth daily.    . metFORMIN (GLUCOPHAGE) 500 MG tablet Take 500 mg by mouth 2 (two) times daily.    . metoprolol (LOPRESSOR) 100 MG tablet Take 100 mg by mouth 2 (two) times daily.    . Multiple Vitamin (MULTIVITAMIN) capsule Take 1 capsule by mouth daily.    . Omega-3 Fatty Acids (FISH OIL) 1000 MG CAPS Take 1 capsule by mouth daily.    Marland Kitchen  simvastatin (ZOCOR) 40 MG tablet Take 40 mg by mouth daily.    . tamsulosin (FLOMAX) 0.4 MG CAPS capsule Take 0.4 mg by mouth daily.    . traZODone (DESYREL) 50 MG tablet Take 50 mg by mouth at bedtime.    . vitamin E 400 UNIT capsule Take 400 Units by mouth daily.     No current facility-administered medications on file prior to visit.    No Known Allergies   REVIEW OF SYSTEMS:  (Positives checked otherwise negative)  CARDIOVASCULAR:  []  chest pain, []  chest pressure, []  palpitations, []  shortness of breath when laying flat, [x]  shortness of breath with exertion,  []  pain in feet when walking, []  pain in feet when laying flat, []  history of blood clot in veins (DVT), []  history of phlebitis, []  swelling in legs, []  varicose veins  PULMONARY:  []  productive cough, []  asthma, []  wheezing  NEUROLOGIC:  [x]  weakness in arms or legs, []  numbness in arms or legs, []  difficulty speaking or slurred  speech, []  temporary loss of vision in one eye, []  dizziness  HEMATOLOGIC:  []  bleeding problems, []  problems with blood clotting too easily  MUSCULOSKEL:  []  joint pain, []  joint swelling  GASTROINTEST:  []  vomiting blood, []  blood in stool     GENITOURINARY:  []  burning with urination, []  blood in urine  PSYCHIATRIC:  []  history of major depression  INTEGUMENTARY:  []  rashes, []  ulcers  CONSTITUTIONAL:  []  fever, []  chills   For VQI Use Only  PRE-ADM LIVING: Home  AMB STATUS: Ambulatory  CAD Sx: None  PRIOR CHF: None  STRESS TEST: [x]  No, [ ]  Normal, [ ]  + ischemia, [ ]  + MI, [ ]  Both   Physical Examination  Filed Vitals:   01/11/15 1206  BP: 100/71  Pulse: 123  Temp: 98.3 F (36.8 C)  TempSrc: Oral  Resp: 18  Height: 5\' 7"  (1.702 m)  Weight: 216 lb (97.977 kg)  SpO2: 99%   Body mass index is 33.82 kg/(m^2).  General: A&O x 3, WD, Obese,   Head: Delta/AT  Ear/Nose/Throat: Hearing grossly intact, nares w/o erythema or drainage, oropharynx w/o Erythema/Exudate, Mallampati score: 3  Eyes: PERRLA, EOMI  Neck: Supple, no nuchal rigidity, no palpable LAD  Pulmonary: Sym exp, good air movt, CTAB, no rales, rhonchi, & wheezing  Cardiac: RRR, Nl S1, S2, no Murmurs, rubs or gallops  Vascular: Vessel Right Left  Radial Palpable Palpable  Ulnar Not Palpable Not Palpable  Brachial Palpable Palpable  Carotid Palpable, without bruit Palpable, without bruit  Aorta Not palpable N/A  Femoral Palpable Palpable  Popliteal Not palpable Not palpable  PT Faintly Palpable Not Palpable  DP Palpable Palpable   Gastrointestinal: soft, NTND, -G/R, - HSM, - masses, - CVAT B  Musculoskeletal: M/S 5/5 throughout , Extremities without ischemic changes , R: tip of 2nd toe dry gangrene, tip of 4 toe dry gangrene, ulcer overlying lateral 5th MT; L foot:  Ulcer on great toe with extensive blistering of entire plantar surface, +odor, edematous L mid-foot with blanch  erythema  Neurologic: CN 2-12 intact , Pain and light touch intact in extremities except both feet, Motor exam as listed above  Psychiatric: Judgment intact, Mood & affect appropriate for pt's clinical situation  Dermatologic: See M/S exam for extremity exam, no rashes otherwise noted  Lymph : No Cervical, Axillary, or Inguinal lymphadenopathy   Outside Studies/Documentation 5 pages of outside documents were reviewed including: outpatient podiatry chart.  Medical Decision Making  [name] [name] is a 69 y.o. male who presents with: BLE critical limb ischemia, B foot osteomyelitis, ascending L foot cellulitis   The left foot is not salvageable despite a palpable pulse in the L foot.    I offered the patient a L BKA.  The patient would like a second opinion.  I suggested Dr. Sharol Given.  I have sent the patient to the hospital for evaluation for admission to hospitalist service for: IV abx, IV pain control, medical optimization including mgmt of any DM related issues.  He should get his second opinion at the hospital, preferrably with Dr. Sharol Given if available.  We are available to provide the L BKA if he elects to proceed.  Thank you for allowing [place] to participate in this patient's care.  Adele Barthel, MD [facility] Office: [phone] Pager: [phone]  01/11/2015, 12:58 PM

## 2015-01-11 NOTE — ED Provider Notes (Signed)
CSN: [id number]     Arrival date & time 01/11/15  1318 History   First MD Initiated Contact with Patient 01/11/15 1513     Chief Complaint  Patient presents with  . Foot Pain     (Consider location/radiation/quality/duration/timing/severity/associated sxs/prior Treatment) HPI Comments: Patient with PMH of DM, HTN, HL, sent to ED by Dr. Bridgett Larsson from vascular surgery for evaluation and treatment of necrotic left foot.  Patient states that he has been ignoring his foot for the past month.  Patient states that he has 6/10 pain with movement and palpation, but no pain at rest.  Patient was advised to have a L BKA by Dr. Bridgett Larsson, but patient wanted a second opinion.  Dr. Bridgett Larsson sent the patient to the ED for evaluation and hopefully to obtain consultation by Dr. Sharol Given.  The history is provided by the patient. No language interpreter was used.    Past Medical History  Diagnosis Date  . Essential hypertension   . Hyperlipidemia   . Type 2 diabetes mellitus   . Critical lower limb ischemia   . Gangrene of foot March 2016    Left foot  . Cancer     Perostate- Stage I   History reviewed. No pertinent past surgical history. Family History  Problem Relation Age of Onset  . Hypertension Father     and kidney failure  . Heart disease Father   . Heart attack Father   . Kidney disease Father   . Heart attack Maternal Grandfather   . Heart attack Paternal Grandfather    History  Substance Use Topics  . Smoking status: Never Smoker   . Smokeless tobacco: Never Used  . Alcohol Use: No    Review of Systems  Constitutional: Negative for fever and chills.  Respiratory: Negative for shortness of breath.   Cardiovascular: Negative for chest pain.  Gastrointestinal: Negative for nausea, vomiting, diarrhea and constipation.  Genitourinary: Negative for dysuria.  Skin: Positive for wound.  All other systems reviewed and are negative.     Allergies  Review of patient's allergies indicates no known  allergies.  Home Medications   Prior to Admission medications   Medication Sig Start Date End Date Taking? Authorizing Provider  ALPRAZolam Duanne Moron) 1 MG tablet  12/21/14   Historical Provider, MD  alprazolam Duanne Moron) 2 MG tablet Take 2 mg by mouth at bedtime.    Historical Provider, MD  aspirin EC 81 MG tablet Take 81 mg by mouth daily.    Historical Provider, MD  cephALEXin (KEFLEX) 500 MG capsule 1,000 mg 2 (two) times daily. 12/28/14   Historical Provider, MD  chlorthalidone (HYGROTON) 25 MG tablet Take 25 mg by mouth daily.    Historical Provider, MD  citalopram (CELEXA) 40 MG tablet Take 40 mg by mouth daily.    Historical Provider, MD  folic acid (FOLVITE) A999333 MCG tablet Take 400 mcg by mouth daily.    Historical Provider, MD  gabapentin (NEURONTIN) 100 MG capsule Take 100 mg by mouth 3 (three) times daily.    Historical Provider, MD  HYDROcodone-acetaminophen (NORCO/VICODIN) 5-325 MG per tablet as needed. 01/02/15   Historical Provider, MD  losartan (COZAAR) 100 MG tablet Take 100 mg by mouth daily.    Historical Provider, MD  metFORMIN (GLUCOPHAGE) 500 MG tablet Take 500 mg by mouth 2 (two) times daily.    Historical Provider, MD  metoprolol (LOPRESSOR) 100 MG tablet Take 100 mg by mouth 2 (two) times daily.    Historical Provider, MD  Multiple Vitamin (MULTIVITAMIN) capsule Take 1 capsule by mouth daily.    Historical Provider, MD  Omega-3 Fatty Acids (FISH OIL) 1000 MG CAPS Take 1 capsule by mouth daily.    Historical Provider, MD  simvastatin (ZOCOR) 40 MG tablet Take 40 mg by mouth daily.    Historical Provider, MD  tamsulosin (FLOMAX) 0.4 MG CAPS capsule Take 0.4 mg by mouth daily.    Historical Provider, MD  traZODone (DESYREL) 50 MG tablet Take 50 mg by mouth at bedtime.    Historical Provider, MD  vitamin E 400 UNIT capsule Take 400 Units by mouth daily.    Historical Provider, MD   BP 113/65 mmHg  Pulse 144  Temp(Src) 98.6 F (37 C) (Oral)  Resp 20  SpO2 96% Physical Exam    Constitutional: He is oriented to person, place, and time. He appears well-developed and well-nourished.  HENT:  Head: Normocephalic and atraumatic.  Eyes: Conjunctivae and EOM are normal. Pupils are equal, round, and reactive to light. Right eye exhibits no discharge. Left eye exhibits no discharge. No scleral icterus.  Neck: Normal range of motion. Neck supple. No JVD present.  Cardiovascular: Normal heart sounds.  Exam reveals no gallop and no friction rub.   No murmur heard. tachycardic  Pulmonary/Chest: Effort normal and breath sounds normal. No respiratory distress. He has no wheezes. He has no rales. He exhibits no tenderness.  Abdominal: Soft. He exhibits no distension and no mass. There is no tenderness. There is no rebound and no guarding.  Musculoskeletal: Normal range of motion. He exhibits no edema or tenderness.  Neurological: He is alert and oriented to person, place, and time.  Skin: Skin is warm and dry.  Left and right foot as pictured below  Psychiatric: He has a normal mood and affect. His behavior is normal. Judgment and thought content normal.  Nursing note and vitals reviewed.   ED Course  Procedures (including critical care time) Labs Review Labs Reviewed  CBC - Abnormal; Notable for the following:    WBC 20.6 (*)    RBC 4.14 (*)    Hemoglobin 12.3 (*)    HCT 35.4 (*)    All other components within normal limits  BASIC METABOLIC PANEL - Abnormal; Notable for the following:    Sodium 133 (*)    Chloride 93 (*)    Glucose, Bld 241 (*)    BUN 39 (*)    Creatinine, Ser 1.74 (*)    GFR calc non Af Amer 38 (*)    GFR calc Af Amer 44 (*)    Anion gap 16 (*)    All other components within normal limits  I-STAT CG4 LACTIC ACID, ED - Abnormal; Notable for the following:    Lactic Acid, Venous 3.11 (*)    All other components within normal limits  I-STAT TROPOININ, ED    Imaging Review No results found.   EKG Interpretation None                 MDM   Final diagnoses:  Critical lower limb ischemia  Gangrene  Cellulitis of foot  Sepsis, due to unspecified organism    Patient with gangrene of left foot.  Sent to ED for second opinion regarding L BKA.  Dr. Bridgett Larsson is available to perform the BKA should the patient decide to proceed.  Patient will be started on broad spectrum abx.  Patient is also found to initially be in afib with RVR.  I discussed this with  Dr. Zenia Resides, who recommends giving fluids given that the patient's BP is ~100.  Hopefully patient with convert and HR will slow with fluids.  Will reassess after 1 L and then add Cardizem if still in afib.  Patient is not anticoagulated.   HR is now 100.  Will hold cardizem.  Patient seen by and discussed with Dr. Zenia Resides.   Patient discussed with Dr. Ninfa Linden of orthopedics who is taking call for Dr. Sharol Given.  Recommends admission to medicine and will consult tonight.    Appreciate Dr. Ninfa Linden for second opinion.  Appreciate Dr. Algis Liming for admission.  Medications  sodium chloride 0.9 % bolus 1,000 mL (1,000 mLs Intravenous New Bag/Given 01/11/15 1615)  piperacillin-tazobactam (ZOSYN) IVPB 3.375 g (3.375 g Intravenous New Bag/Given 01/11/15 1616)  vancomycin (VANCOCIN) 1,750 mg in sodium chloride 0.9 % 500 mL IVPB (1,750 mg Intravenous New Bag/Given 01/11/15 1617)    Followed by  vancomycin (VANCOCIN) 1,250 mg in sodium chloride 0.9 % 250 mL IVPB (not administered)   Critical time warranted for sepsis with critical limb ischemia and initial afib with RVR which ultimately resolved spontaneously.  CRITICAL CARE Performed by: Montine Circle   Total critical care time: 45  Critical care time was exclusive of separately billable procedures and treating other patients.  Critical care was necessary to treat or prevent imminent or life-threatening deterioration.  Critical care was time spent personally by me on the following activities: development of  treatment plan with patient and/or surrogate as well as nursing, discussions with consultants, evaluation of patient's response to treatment, examination of patient, obtaining history from patient or surrogate, ordering and performing treatments and interventions, ordering and review of laboratory studies, ordering and review of radiographic studies, pulse oximetry and re-evaluation of patient's condition.      Montine Circle, PA-C 01/11/15 [phone]

## 2015-01-11 NOTE — ED Notes (Signed)
MD at bedside. 

## 2015-01-12 ENCOUNTER — Inpatient Hospital Stay (HOSPITAL_COMMUNITY): Payer: Medicare Other | Admitting: Anesthesiology

## 2015-01-12 ENCOUNTER — Encounter (HOSPITAL_COMMUNITY)
Admission: EM | Disposition: A | Discharge status: Skilled Nursing Facility | Payer: Self-pay | Source: Home / Self Care | Attending: Internal Medicine

## 2015-01-12 ENCOUNTER — Encounter (HOSPITAL_COMMUNITY): Payer: Self-pay | Admitting: Anesthesiology

## 2015-01-12 DIAGNOSIS — I998 Other disorder of circulatory system: Secondary | ICD-10-CM

## 2015-01-12 DIAGNOSIS — I10 Essential (primary) hypertension: Secondary | ICD-10-CM

## 2015-01-12 DIAGNOSIS — E785 Hyperlipidemia, unspecified: Secondary | ICD-10-CM

## 2015-01-12 DIAGNOSIS — A419 Sepsis, unspecified organism: Principal | ICD-10-CM

## 2015-01-12 DIAGNOSIS — E1169 Type 2 diabetes mellitus with other specified complication: Secondary | ICD-10-CM

## 2015-01-12 DIAGNOSIS — L089 Local infection of the skin and subcutaneous tissue, unspecified: Secondary | ICD-10-CM

## 2015-01-12 HISTORY — PX: APPLICATION OF WOUND VAC: SHX5189

## 2015-01-12 HISTORY — PX: AMPUTATION: SHX166

## 2015-01-12 LAB — CBC
HCT: 32.3 % — ABNORMAL LOW (ref 39.0–52.0)
Hemoglobin: 10.9 g/dL — ABNORMAL LOW (ref 13.0–17.0)
MCH: 28.6 pg (ref 26.0–34.0)
MCHC: 33.7 g/dL (ref 30.0–36.0)
MCV: 84.8 fL (ref 78.0–100.0)
Platelets: 284 10*3/uL (ref 150–400)
RBC: 3.81 MIL/uL — ABNORMAL LOW (ref 4.22–5.81)
RDW: 13.7 % (ref 11.5–15.5)
WBC: 13.8 10*3/uL — ABNORMAL HIGH (ref 4.0–10.5)

## 2015-01-12 LAB — HEPARIN LEVEL (UNFRACTIONATED): Heparin Unfractionated: <0.1 IU/mL — ABNORMAL LOW (ref 0.30–0.70)

## 2015-01-12 LAB — GLUCOSE, CAPILLARY
GLUCOSE-CAPILLARY: 136 mg/dL — AB (ref 70–99)
GLUCOSE-CAPILLARY: 125 mg/dL — AB (ref 70–99)
Glucose-Capillary: 126 mg/dL — ABNORMAL HIGH (ref 70–99)
Glucose-Capillary: 150 mg/dL — ABNORMAL HIGH (ref 70–99)

## 2015-01-12 LAB — BASIC METABOLIC PANEL
ANION GAP: 8 (ref 5–15)
BUN: 37 mg/dL — ABNORMAL HIGH (ref 6–23)
CHLORIDE: 100 mmol/L (ref 96–112)
CO2: 26 mmol/L (ref 19–32)
CREATININE: 1.38 mg/dL — AB (ref 0.50–1.35)
Calcium: 7.8 mg/dL — ABNORMAL LOW (ref 8.4–10.5)
GFR, EST AFRICAN AMERICAN: 59 mL/min — AB (ref >90)
GFR, EST NON AFRICAN AMERICAN: 51 mL/min — AB (ref >90)
Glucose, Bld: 128 mg/dL — ABNORMAL HIGH (ref 70–99)
Potassium: 3 mmol/L — ABNORMAL LOW (ref 3.5–5.1)
Sodium: 134 mmol/L — ABNORMAL LOW (ref 135–145)

## 2015-01-12 LAB — PROTIME-INR
INR: 1.26 (ref 0.00–1.49)
PROTHROMBIN TIME: 15.9 s — AB (ref 11.6–15.2)

## 2015-01-12 LAB — SURGICAL PCR SCREEN
MRSA, PCR: NEGATIVE
Staphylococcus aureus: POSITIVE — AB

## 2015-01-12 LAB — ABO/RH: ABO/RH(D): A POS

## 2015-01-12 LAB — TYPE AND SCREEN
ABO/RH(D): A POS
Antibody Screen: NEGATIVE

## 2015-01-12 LAB — TSH: TSH: 2.469 u[IU]/mL (ref 0.350–4.500)

## 2015-01-12 SURGERY — AMPUTATION BELOW KNEE
Anesthesia: General | Laterality: Left

## 2015-01-12 SURGERY — AMPUTATION BELOW KNEE
Anesthesia: General | Site: Leg Lower | Laterality: Right

## 2015-01-12 MED ORDER — PHENYLEPHRINE 40 MCG/ML (10ML) SYRINGE FOR IV PUSH (FOR BLOOD PRESSURE SUPPORT)
PREFILLED_SYRINGE | INTRAVENOUS | Status: AC
  Filled 2015-01-12: qty 10

## 2015-01-12 MED ORDER — METOPROLOL TARTRATE 50 MG PO TABS
ORAL_TABLET | ORAL | Status: AC
  Filled 2015-01-12: qty 2

## 2015-01-12 MED ORDER — MUPIROCIN 2 % EX OINT
1.0000 "application " | TOPICAL_OINTMENT | Freq: Two times a day (BID) | CUTANEOUS | Status: DC
  Administered 2015-01-12 – 2015-01-15 (×5): 1 via NASAL
  Filled 2015-01-12: qty 22

## 2015-01-12 MED ORDER — ROCURONIUM BROMIDE 50 MG/5ML IV SOLN
INTRAVENOUS | Status: AC
  Filled 2015-01-12: qty 1

## 2015-01-12 MED ORDER — OXYCODONE HCL 5 MG PO TABS: 5.0000 mg | ORAL_TABLET | Freq: Once | ORAL | Status: AC | PRN

## 2015-01-12 MED ORDER — LACTATED RINGERS IV SOLN
INTRAVENOUS | Status: DC | PRN
  Administered 2015-01-12: 11:00:00 via INTRAVENOUS

## 2015-01-12 MED ORDER — HYDROMORPHONE HCL 1 MG/ML IJ SOLN
INTRAMUSCULAR | Status: AC
  Filled 2015-01-12: qty 1

## 2015-01-12 MED ORDER — LIDOCAINE HCL (CARDIAC) 20 MG/ML IV SOLN
INTRAVENOUS | Status: DC | PRN
  Administered 2015-01-12: 80 mg via INTRAVENOUS

## 2015-01-12 MED ORDER — OXYCODONE HCL 5 MG/5ML PO SOLN: 5.0000 mg | Freq: Once | ORAL | Status: AC | PRN

## 2015-01-12 MED ORDER — SUCCINYLCHOLINE CHLORIDE 20 MG/ML IJ SOLN
INTRAMUSCULAR | Status: AC
  Filled 2015-01-12: qty 1

## 2015-01-12 MED ORDER — FENTANYL CITRATE 0.05 MG/ML IJ SOLN
INTRAMUSCULAR | Status: DC | PRN
  Administered 2015-01-12: 50 ug via INTRAVENOUS
  Administered 2015-01-12: 100 ug via INTRAVENOUS
  Administered 2015-01-12 (×2): 50 ug via INTRAVENOUS

## 2015-01-12 MED ORDER — ONDANSETRON HCL 4 MG/2ML IJ SOLN: 4.0000 mg | Freq: Once | INTRAMUSCULAR | Status: AC | PRN

## 2015-01-12 MED ORDER — ARTIFICIAL TEARS OP OINT
TOPICAL_OINTMENT | OPHTHALMIC | Status: DC | PRN
  Administered 2015-01-12: 1 via OPHTHALMIC

## 2015-01-12 MED ORDER — LIDOCAINE HCL (CARDIAC) 20 MG/ML IV SOLN
INTRAVENOUS | Status: AC
  Filled 2015-01-12: qty 5

## 2015-01-12 MED ORDER — PROPOFOL 10 MG/ML IV BOLUS
INTRAVENOUS | Status: AC
  Filled 2015-01-12: qty 20

## 2015-01-12 MED ORDER — ARTIFICIAL TEARS OP OINT
TOPICAL_OINTMENT | OPHTHALMIC | Status: AC
  Filled 2015-01-12: qty 3.5

## 2015-01-12 MED ORDER — FENTANYL CITRATE 0.05 MG/ML IJ SOLN
INTRAMUSCULAR | Status: AC
  Filled 2015-01-12: qty 5

## 2015-01-12 MED ORDER — GLYCOPYRROLATE 0.2 MG/ML IJ SOLN
INTRAMUSCULAR | Status: DC | PRN
  Administered 2015-01-12: 0.2 mg via INTRAVENOUS

## 2015-01-12 MED ORDER — HYDROMORPHONE HCL 1 MG/ML IJ SOLN
0.2500 mg | INTRAMUSCULAR | Status: AC | PRN
  Administered 2015-01-12 – 2015-01-13 (×8): 0.5 mg via INTRAVENOUS
  Filled 2015-01-12 (×4): qty 1

## 2015-01-12 MED ORDER — PIPERACILLIN-TAZOBACTAM 3.375 G IVPB
3.3750 g | Freq: Three times a day (TID) | INTRAVENOUS | Status: DC
  Administered 2015-01-12 – 2015-01-15 (×10): 3.375 g via INTRAVENOUS
  Filled 2015-01-12 (×11): qty 50

## 2015-01-12 MED ORDER — GLYCOPYRROLATE 0.2 MG/ML IJ SOLN
INTRAMUSCULAR | Status: AC
  Filled 2015-01-12: qty 2

## 2015-01-12 MED ORDER — PROPOFOL 10 MG/ML IV BOLUS
INTRAVENOUS | Status: DC | PRN
  Administered 2015-01-12: 180 mg via INTRAVENOUS
  Administered 2015-01-12: 20 mg via INTRAVENOUS

## 2015-01-12 MED ORDER — CHLORHEXIDINE GLUCONATE CLOTH 2 % EX PADS
6.0000 | MEDICATED_PAD | Freq: Every day | CUTANEOUS | Status: DC
  Administered 2015-01-12 – 2015-01-15 (×2): 6 via TOPICAL

## 2015-01-12 MED ORDER — PHENYLEPHRINE HCL 10 MG/ML IJ SOLN
INTRAMUSCULAR | Status: DC | PRN
  Administered 2015-01-12 (×3): 80 ug via INTRAVENOUS

## 2015-01-12 MED ORDER — ROCURONIUM BROMIDE 100 MG/10ML IV SOLN
INTRAVENOUS | Status: DC | PRN
  Administered 2015-01-12: 25 mg via INTRAVENOUS

## 2015-01-12 MED ORDER — 0.9 % SODIUM CHLORIDE (POUR BTL) OPTIME
TOPICAL | Status: DC | PRN
  Administered 2015-01-12: 1000 mL

## 2015-01-12 MED ORDER — ONDANSETRON HCL 4 MG/2ML IJ SOLN
INTRAMUSCULAR | Status: AC
  Filled 2015-01-12: qty 2

## 2015-01-12 MED ORDER — LACTATED RINGERS IV SOLN
INTRAVENOUS | Status: DC
  Administered 2015-01-12: 10:00:00 via INTRAVENOUS

## 2015-01-12 MED ORDER — MIDAZOLAM HCL 2 MG/2ML IJ SOLN
INTRAMUSCULAR | Status: AC
  Filled 2015-01-12: qty 2

## 2015-01-12 MED ORDER — POTASSIUM CHLORIDE 10 MEQ/100ML IV SOLN
10.0000 meq | INTRAVENOUS | Status: AC
  Administered 2015-01-12 – 2015-01-13 (×5): 10 meq via INTRAVENOUS
  Filled 2015-01-12 (×5): qty 100

## 2015-01-12 MED ORDER — NEOSTIGMINE METHYLSULFATE 10 MG/10ML IV SOLN
INTRAVENOUS | Status: DC | PRN
  Administered 2015-01-12: 2 mg via INTRAVENOUS

## 2015-01-12 MED ORDER — SODIUM CHLORIDE 0.9 % IR SOLN
Status: DC | PRN
  Administered 2015-01-12: 3000 mL

## 2015-01-12 MED ORDER — ONDANSETRON HCL 4 MG/2ML IJ SOLN
INTRAMUSCULAR | Status: DC | PRN
  Administered 2015-01-12: 4 mg via INTRAVENOUS

## 2015-01-12 MED ORDER — SUCCINYLCHOLINE CHLORIDE 20 MG/ML IJ SOLN
INTRAMUSCULAR | Status: DC | PRN
  Administered 2015-01-12: 80 mg via INTRAVENOUS

## 2015-01-12 SURGICAL SUPPLY — 41 items
BANDAGE ESMARK 6X9 LF (GAUZE/BANDAGES/DRESSINGS) ×1 IMPLANT
BLADE SAW RECIP 87.9 MT (BLADE) ×2 IMPLANT
BNDG COHESIVE 4X5 TAN STRL (GAUZE/BANDAGES/DRESSINGS) ×2 IMPLANT
BNDG COHESIVE 6X5 TAN STRL LF (GAUZE/BANDAGES/DRESSINGS) ×4 IMPLANT
BNDG ESMARK 6X9 LF (GAUZE/BANDAGES/DRESSINGS) ×2
BNDG GAUZE ELAST 4 BULKY (GAUZE/BANDAGES/DRESSINGS) ×8 IMPLANT
COVER SURGICAL LIGHT HANDLE (MISCELLANEOUS) ×2 IMPLANT
CUFF TOURNIQUET SINGLE 34IN LL (TOURNIQUET CUFF) ×2 IMPLANT
DRAPE EXTREMITY T 121X128X90 (DRAPE) ×2 IMPLANT
DRAPE ORTHO SPLIT 77X108 STRL (DRAPES)
DRAPE PROXIMA HALF (DRAPES) ×2 IMPLANT
DRAPE SURG ORHT 6 SPLT 77X108 (DRAPES) IMPLANT
DRAPE U-SHAPE 47X51 STRL (DRAPES) ×2 IMPLANT
DRSG PAD ABDOMINAL 8X10 ST (GAUZE/BANDAGES/DRESSINGS) ×2 IMPLANT
ELECT CAUTERY BLADE 6.4 (BLADE) ×2 IMPLANT
ELECT REM PT RETURN 9FT ADLT (ELECTROSURGICAL) ×2
ELECTRODE REM PT RTRN 9FT ADLT (ELECTROSURGICAL) ×1 IMPLANT
EVACUATOR 1/8 PVC DRAIN (DRAIN) IMPLANT
FACESHIELD WRAPAROUND (MASK) IMPLANT
GAUZE SPONGE 4X4 12PLY STRL (GAUZE/BANDAGES/DRESSINGS) ×2 IMPLANT
GAUZE XEROFORM 5X9 LF (GAUZE/BANDAGES/DRESSINGS) ×2 IMPLANT
GLOVE NEODERM STRL 7.5 LF PF (GLOVE) ×2 IMPLANT
GLOVE SURG NEODERM 7.5  LF PF (GLOVE) ×2
GOWN STRL REIN XL XLG (GOWN DISPOSABLE) ×2 IMPLANT
KIT BASIN OR (CUSTOM PROCEDURE TRAY) ×2 IMPLANT
NS IRRIG 1000ML POUR BTL (IV SOLUTION) ×2 IMPLANT
PACK GENERAL/GYN (CUSTOM PROCEDURE TRAY) ×2 IMPLANT
PAD ARMBOARD 7.5X6 YLW CONV (MISCELLANEOUS) ×2 IMPLANT
PAD CAST 4YDX4 CTTN HI CHSV (CAST SUPPLIES) ×4 IMPLANT
PADDING CAST COTTON 4X4 STRL (CAST SUPPLIES) ×4
SPONGE LAP 18X18 X RAY DECT (DISPOSABLE) IMPLANT
STAPLER VISISTAT 35W (STAPLE) IMPLANT
STOCKINETTE IMPERVIOUS LG (DRAPES) ×2 IMPLANT
SUT ETHILON 2 0 PSLX (SUTURE) ×4 IMPLANT
SUT MON AB 2-0 CT1 36 (SUTURE) ×4 IMPLANT
SUT PDS AB 0 CT 36 (SUTURE) IMPLANT
SUT SILK 0 TIES 10X30 (SUTURE) ×2 IMPLANT
SUT SILK 2 0 SH CR/8 (SUTURE) IMPLANT
SUT VIC AB 2-0 CT1 27 (SUTURE) ×2
SUT VIC AB 2-0 CT1 TAPERPNT 27 (SUTURE) ×2 IMPLANT
TOWEL OR 17X26 10 PK STRL BLUE (TOWEL DISPOSABLE) ×6 IMPLANT

## 2015-01-12 SURGICAL SUPPLY — 53 items
BANDAGE ESMARK 6X9 LF (GAUZE/BANDAGES/DRESSINGS) ×2 IMPLANT
BLADE SAW RECIP 87.9 MT (BLADE) ×3 IMPLANT
BNDG COHESIVE 4X5 TAN STRL (GAUZE/BANDAGES/DRESSINGS) ×3 IMPLANT
BNDG COHESIVE 6X5 TAN STRL LF (GAUZE/BANDAGES/DRESSINGS) ×3 IMPLANT
BNDG ESMARK 6X9 LF (GAUZE/BANDAGES/DRESSINGS) ×3
BNDG GAUZE ELAST 4 BULKY (GAUZE/BANDAGES/DRESSINGS) ×12 IMPLANT
CANISTER WOUND CARE 500ML ATS (WOUND CARE) ×3 IMPLANT
COVER SURGICAL LIGHT HANDLE (MISCELLANEOUS) ×3 IMPLANT
CUFF TOURNIQUET SINGLE 34IN LL (TOURNIQUET CUFF) ×3 IMPLANT
DRAPE EXTREMITY BILATERAL (DRAPE) ×3 IMPLANT
DRAPE EXTREMITY T 121X128X90 (DRAPE) ×3 IMPLANT
DRAPE ORTHO SPLIT 77X108 STRL (DRAPES)
DRAPE PROXIMA HALF (DRAPES) ×3 IMPLANT
DRAPE SURG 17X23 STRL (DRAPES) ×12 IMPLANT
DRAPE SURG ORHT 6 SPLT 77X108 (DRAPES) IMPLANT
DRAPE U-SHAPE 47X51 STRL (DRAPES) ×3 IMPLANT
DRSG PAD ABDOMINAL 8X10 ST (GAUZE/BANDAGES/DRESSINGS) ×6 IMPLANT
DRSG VAC ATS SM SENSATRAC (GAUZE/BANDAGES/DRESSINGS) ×3 IMPLANT
ELECT CAUTERY BLADE 6.4 (BLADE) ×3 IMPLANT
ELECT REM PT RETURN 9FT ADLT (ELECTROSURGICAL) ×3
ELECTRODE REM PT RTRN 9FT ADLT (ELECTROSURGICAL) ×2 IMPLANT
EVACUATOR 1/8 PVC DRAIN (DRAIN) IMPLANT
FACESHIELD WRAPAROUND (MASK) IMPLANT
GAUZE SPONGE 4X4 12PLY STRL (GAUZE/BANDAGES/DRESSINGS) ×3 IMPLANT
GAUZE XEROFORM 5X9 LF (GAUZE/BANDAGES/DRESSINGS) ×3 IMPLANT
GLOVE BIOGEL PI IND STRL 8 (GLOVE) ×2 IMPLANT
GLOVE BIOGEL PI INDICATOR 8 (GLOVE) ×1
GLOVE NEODERM STRL 7.5 LF PF (GLOVE) ×4 IMPLANT
GLOVE SURG NEODERM 7.5  LF PF (GLOVE) ×2
GLOVE SURG SYN 7.5  E (GLOVE) ×1
GLOVE SURG SYN 7.5 E (GLOVE) ×2 IMPLANT
GOWN STRL REIN XL XLG (GOWN DISPOSABLE) ×3 IMPLANT
KIT BASIN OR (CUSTOM PROCEDURE TRAY) ×3 IMPLANT
NS IRRIG 1000ML POUR BTL (IV SOLUTION) ×3 IMPLANT
PACK GENERAL/GYN (CUSTOM PROCEDURE TRAY) ×3 IMPLANT
PAD ARMBOARD 7.5X6 YLW CONV (MISCELLANEOUS) ×3 IMPLANT
PAD CAST 4YDX4 CTTN HI CHSV (CAST SUPPLIES) ×2 IMPLANT
PADDING CAST COTTON 4X4 STRL (CAST SUPPLIES) ×1
SET IRRIG Y TYPE TUR BLADDER L (SET/KITS/TRAYS/PACK) ×3 IMPLANT
SPONGE GAUZE 4X4 12PLY STER LF (GAUZE/BANDAGES/DRESSINGS) ×3 IMPLANT
SPONGE LAP 18X18 X RAY DECT (DISPOSABLE) IMPLANT
STAPLER VISISTAT 35W (STAPLE) IMPLANT
STOCKINETTE IMPERVIOUS 9X36 MD (GAUZE/BANDAGES/DRESSINGS) ×3 IMPLANT
STOCKINETTE IMPERVIOUS LG (DRAPES) ×3 IMPLANT
SUT ETHILON 2 0 PSLX (SUTURE) ×6 IMPLANT
SUT MON AB 2-0 CT1 36 (SUTURE) ×3 IMPLANT
SUT PDS AB 0 CT 36 (SUTURE) ×6 IMPLANT
SUT SILK 0 TIES 10X30 (SUTURE) ×3 IMPLANT
SUT SILK 2 0 SH CR/8 (SUTURE) ×3 IMPLANT
SUT VIC AB 2-0 CT1 27 (SUTURE) ×2
SUT VIC AB 2-0 CT1 TAPERPNT 27 (SUTURE) ×4 IMPLANT
TOWEL OR 17X26 10 PK STRL BLUE (TOWEL DISPOSABLE) ×9 IMPLANT
TUBE CONNECTING 12X1/4 (SUCTIONS) ×3 IMPLANT

## 2015-01-12 NOTE — Clinical Documentation Improvement (Signed)
A cause and effect relationship may not be assumed and must be documented by a provider. Please clarify the relationship, if any, between Diabetes Mellitus and Osteomyletis.  Are the conditions: ? Due to or associated with each other ? Other (please specify) ? Unrelated to each other ? Unable to determine ? Unknown    Supporting Information: (As per notes) "Overwhelming necrosis, cellulitis, and deep infection with abscess and osteomyelitis left foot with severe peripheral vascular disease; PVD right foot with small wounds "  Thank You, Alessandra Grout, RN, BSN, CCDS,Clinical Documentation Specialist:  [phone]  [phone]=[facility] [place]- Health Information Management

## 2015-01-12 NOTE — Progress Notes (Signed)
Due to low BP, Dr. Al Corpus, Anesthesiologist, ordered to hold PO metoprolol.

## 2015-01-12 NOTE — Anesthesia Preprocedure Evaluation (Addendum)
Anesthesia Evaluation  Patient identified by MRN, date of birth, ID band Patient awake    Reviewed: Allergy & Precautions, NPO status , Patient's Chart, lab work & pertinent test results, reviewed documented beta blocker date and time   Airway Mallampati: I  TM Distance: >3 FB Neck ROM: Full    Dental  (+) Teeth Intact, Dental Advisory Given   Pulmonary  breath sounds clear to auscultation        Cardiovascular hypertension, Pt. on medications and Pt. on home beta blockers Dysrhythmias: Hx of A FIB with RVR. Rhythm:Regular Rate:Normal     Neuro/Psych    GI/Hepatic   Endo/Other  diabetes, Poorly Controlled, Type 2, Oral Hypoglycemic AgentsMorbid obesity  Renal/GU      Musculoskeletal   Abdominal   Peds  Hematology   Anesthesia Other Findings   Reproductive/Obstetrics                            Anesthesia Physical Anesthesia Plan  ASA: III  Anesthesia Plan: General   Post-op Pain Management:    Induction: Intravenous  Airway Management Planned: LMA  Additional Equipment:   Intra-op Plan:   Post-operative Plan: Extubation in OR  Informed Consent: I have reviewed the patients History and Physical, chart, labs and discussed the procedure including the risks, benefits and alternatives for the proposed anesthesia with the patient or authorized representative who has indicated his/her understanding and acceptance.   Dental advisory given  Plan Discussed with: CRNA, Surgeon and Anesthesiologist  Anesthesia Plan Comments:         Anesthesia Quick Evaluation

## 2015-01-12 NOTE — Progress Notes (Signed)
PT Cancellation Note  Patient Details Name: [name] [name] MRN: [id number] DOB: [birth year]   Cancelled Treatment:    Reason Eval/Treat Not Completed: Patient not medically ready Patient to OR for Lt transtibial amputation. Please re-order physical therapy post-op when you feel patient is medically ready.  Ellouise Newer 01/12/2015, 8:19 AM Elayne Snare, [place]

## 2015-01-12 NOTE — Consult Note (Signed)
Pt to go to surgery today for foot wound that WOC was consulted for, therefore we will not see this patient.  Please re-consult [facility] team if needed postoperatively  Lorretta Kerce Liane Comber RN,CWOCN [id number]

## 2015-01-12 NOTE — Progress Notes (Signed)
[facility]                                                                              Patient Demographics  [name] [name], is a 69 y.o. male, DOB - 02/15/1946, [id number]  Admit date - 01/11/2015   Admitting Physician Modena Jansky, MD  Outpatient Primary MD for the patient is [facility], MD  LOS - 1   Chief Complaint  Patient presents with  . Foot Pain      HPI 01/11/2015 by Dr. Vernell Leep [name] [name] is a 69 y.o. male with history of type II DM with peripheral neuropathy, essential hypertension, hyperlipidemia, stage I prostate cancer, atrial flutter status post ablation a year ago for which she was briefly on Xarelto, sent to the ED from vascular surgery/Dr. Chen's office for further evaluation of left foot osteomyelitis and cellulitis. Patient states that he lacks sensation in both feet for the last 5 years. Sometime a month ago, he sustained an injury to the left foot which she did not realize until he started noticing foul smell, purulent drainage admixed with blood. This progressively got worse over the next few weeks. He denies pain. He eventually saw a podiatrist Dr. Barkley Bruns who referred him to see Dr. Quay Burow whom he saw on 3/22 and plans were to get arterial Dopplers and arrange for him to undergo angiography and potential intervention for limb salvage today. He was then referred to see Dr. Bridgett Larsson home he did today and was sent to the ED for evaluation. Patient requested a second opinion and EDP has consulted orthopedics/Dr. Ninfa Linden. Per EDP, patient was transiently in A. fib with RVR and spontaneously reverted to sinus rhythm. Patient denies chest pain, dyspnea, palpitations, dizziness. He states that he had a fever of 102F 2 days ago. He denies any other complaints. In the ED, blood glucose was 241, creatinine 1.74 WBC 20.6, hemoglobin 12.3, x-ray of left foot shows osteomyelitis of the proximal and distal phalanges of the great toe and proximal and middle  phalanges of the second toe and gas gangrene of the forefoot. X-rays of the right foot show no radiographic evidence of osteomyelitis. Hospitalist admission requested.  Assessment & Plan   Sepsis secondary to Osteomyelitis of the left foot/critical limb ischemia -Patient had leukocytosis with tachycardia upon admission, leukocytosis trending downward, tachycardia resolved -Orthopedic surgery consulted and appreciated, patient scheduled for surgery later today -Continue IV vancomycin and Zosyn -Vascular surgery consulted, left foot is not salvageable, vascular surgery did recommend repair however patient wanted second opinion -Given patient's comorbidities, age, he is moderate risk for surgery  Paroxysmal atrial fibrillation with RVR -Status post ablation in 2015 by Dr. Georg Ruddle in [place] -Was on Xarelto at that time -TSH 2.469 -Echocardiogram pending ChadsVASC 4 -Continue heparin (held for surgery) -Continue metoprolol (currently rate controlled)  Essential hypertension  -Continue metoprolol -ARB and diuretic held due to acute on chronic kidney disease  Diabetes mellitus, type II, with neuropathy/diabetic foot infection -Treatment plan as above for infection -Hemoglobin A1c pending -Continue Lantus, and complaints go CBG monitoring -Wound care for right foot infection  Acute on chronic renal failure, ?Stage 3 -Unknown baseline creatinine, last known creatinine of 3/22 1.5 -  Continue to monitor BMP  Hyperlipidemia -Continue statin  Hypokalemia  -Will replace and continue monitor BMP  Code Status: Full  Family Communication: None at bedside  Disposition Plan: Admitted, pending surgery today  Time Spent in minutes   30 minutes  Procedures  LBKA  Consults   Orthopedic Surgery   DVT Prophylaxis  Heparin held for surgery  Lab Results  Component Value Date   PLT 284 01/12/2015    Medications  Scheduled Meds: . alprazolam  2 mg Oral QHS  . aspirin EC  81  mg Oral Daily  . Chlorhexidine Gluconate Cloth  6 each Topical Daily  . citalopram  40 mg Oral Daily  . folic acid  [id number] mcg Oral Daily  . gabapentin  100 mg Oral TID  . insulin aspart  0-5 Units Subcutaneous QHS  . insulin aspart  0-9 Units Subcutaneous TID WC  . insulin glargine  15 Units Subcutaneous QHS  . metoprolol  100 mg Oral BID  . multivitamin with minerals  1 tablet Oral Daily  . mupirocin ointment  1 application Nasal BID  . piperacillin-tazobactam (ZOSYN)  IV  3.375 g Intravenous Q8H  . simvastatin  40 mg Oral q1800  . sodium chloride  3 mL Intravenous Q12H  . tamsulosin  0.4 mg Oral Daily  . traZODone  50 mg Oral QHS  . vancomycin  1,250 mg Intravenous Q24H   Continuous Infusions: . sodium chloride 125 mL/hr at 01/11/15 2315  . heparin Stopped (01/12/15 0750)  . lactated ringers 10 mL/hr at 01/12/15 1010   PRN Meds:.0.9 % irrigation (POUR BTL), acetaminophen **OR** acetaminophen, albuterol, ondansetron **OR** ondansetron (ZOFRAN) IV, sodium chloride irrigation  Antibiotics   Anti-infectives    Start     Dose/Rate Route Frequency Ordered Stop   01/12/15 1630  vancomycin (VANCOCIN) 1,250 mg in sodium chloride 0.9 % 250 mL IVPB     1,250 mg 166.7 mL/hr over 90 Minutes Intravenous Every 24 hours 01/11/15 1556     01/12/15 1200  piperacillin-tazobactam (ZOSYN) IVPB 3.375 g     3.375 g 12.5 mL/hr over 240 Minutes Intravenous Every 8 hours 01/12/15 1101     01/11/15 1630  vancomycin (VANCOCIN) 1,750 mg in sodium chloride 0.9 % 500 mL IVPB     1,750 mg 250 mL/hr over 120 Minutes Intravenous  Once 01/11/15 1556 01/11/15 1817   01/11/15 1600  piperacillin-tazobactam (ZOSYN) IVPB 3.375 g     3.375 g 100 mL/hr over 30 Minutes Intravenous  Once 01/11/15 1548 01/11/15 1725      Subjective:   [name] [name] seen and examined today. Patient states that he is tired. Does not wish to speak at this time. Denies chest pain, shortness of breath.  Objective:   Filed Vitals:    01/11/15 1716 01/11/15 1936 01/12/15 0508 01/12/15 1012  BP:  114/76 97/63 101/72  Pulse:  99 93 85  Temp:  97.6 F (36.4 C) 98.1 F (36.7 C)   TempSrc:  Oral Oral   Resp:  16 18 18   Height: 5\' 7"  (1.702 m) 5\' 7"  (1.702 m)    Weight: 98 kg (216 lb 0.8 oz) 100.5 kg (221 lb 9 oz) 100.5 kg (221 lb 9 oz)   SpO2:  97% 97%     Wt Readings from Last 3 Encounters:  01/12/15 100.5 kg (221 lb 9 oz)  01/11/15 97.977 kg (216 lb)  01/09/15 100.245 kg (221 lb)     Intake/Output Summary (Last 24 hours) at 01/12/15 1202  Last data filed at 01/12/15 [id number]  Gross per 24 hour  Intake      0 ml  Output    875 ml  Net   -875 ml    Exam  General: Well developed, well nourished, NAD  HEENT: NCAT,mucous membranes moist.   Cardiovascular: S1 S2 auscultated, irregular  Respiratory: Clear to auscultation bilaterally with equal chest rise  Abdomen: Soft, nontender, nondistended, + bowel sounds  Extremities: warm dry without cyanosis clubbing or edema. Left foot currently wrapped, right foot:  amputation of right great toe distal phalanx, dry gangrene on the second and fourth toes.  Neuro: AAOx3, nonfocal  Data Review   Micro Results Recent Results (from the past 240 hour(s))  Surgical pcr screen     Status: Abnormal   Collection Time: 01/11/15 11:10 PM  Result Value Ref Range Status   MRSA, PCR NEGATIVE NEGATIVE Final   Staphylococcus aureus POSITIVE (A) NEGATIVE Final    Comment:        The Xpert SA Assay (FDA approved for NASAL specimens in patients over 73 years of age), is one component of a comprehensive surveillance program.  Test performance has been validated by [facility] for patients greater than or equal to 21 year old. It is not intended to diagnose infection nor to guide or monitor treatment.     Radiology Reports Dg Foot Complete Left  01/11/2015   CLINICAL DATA:  Cellulitis and open wound on the great toe.  EXAM: LEFT FOOT - COMPLETE 3+ VIEW  COMPARISON:  None.   FINDINGS: There is destruction of the distal aspect of the proximal phalanx of the great toe in the distal phalanx is completely destroyed.  There is also osseous destruction at the PIP joint of the second toe.  There is severe arthritis at the first metatarsal phalangeal joint and there is gas in the soft tissues overlying the entire first metatarsal suggesting gas gangrene. The soft tissue gas extends onto the plantar aspect of the foot underlying the second and third metatarsals.  There is a prominent Haglund deformity at the Achilles insertion on the calcaneus. There is dorsal spurring at the talonavicular joint.  Prominent soft tissue swelling of forefoot.  IMPRESSION: Osteomyelitis of the proximal and distal phalanges of the great toe. Osteomyelitis of the proximal and middle phalanges of the second toe.  Gas gangrene of the forefoot.   Electronically Signed   By: Lorriane Shire M.D.   On: 01/11/2015 16:48   Dg Foot Complete Right  01/11/2015   CLINICAL DATA:  Cellulitis with open wounds on the first through third toes of the right foot.  EXAM: RIGHT FOOT COMPLETE - 3+ VIEW  COMPARISON:  MRI dated 03/06/2011  FINDINGS: The distal phalanx of the great toe has been amputated. There is severe arthritis of the first metatarsal phalangeal joint. There are no findings of osteomyelitis. There is a Haglund deformity at the insertion of the Achilles tendon on the posterior calcaneus. Small plantar calcaneal spur. Slight dorsal spurring in the midfoot.  IMPRESSION: No radiographic evidence of osteomyelitis.   Electronically Signed   By: Lorriane Shire M.D.   On: 01/11/2015 16:46    CBC  Recent Labs Lab 01/09/15 1332 01/11/15 1402 01/12/15 0335  WBC 19.0* 20.6* 13.8*  HGB 12.1* 12.3* 10.9*  HCT 35.7* 35.4* 32.3*  PLT 292 295 284  MCV 87.3 85.5 84.8  MCH 29.6 29.7 28.6  MCHC 33.9 34.7 33.7  RDW 13.4 13.6 13.7    Chemistries  Recent Labs Lab 01/09/15 1332 01/11/15 1402 01/12/15 0335  NA 134*  133* 134*  K 4.1 3.5 3.0*  CL 93* 93* 100  CO2 24 24 26   GLUCOSE 263* 241* 128*  BUN 29* 39* 37*  CREATININE 1.54* 1.74* 1.38*  CALCIUM 8.9 8.5 7.8*   ------------------------------------------------------------------------------------------------------------------ estimated creatinine clearance is 57.1 mL/min (by C-G formula based on Cr of 1.38). ------------------------------------------------------------------------------------------------------------------ No results for input(s): HGBA1C in the last 72 hours. ------------------------------------------------------------------------------------------------------------------ No results for input(s): CHOL, HDL, LDLCALC, TRIG, CHOLHDL, LDLDIRECT in the last 72 hours. ------------------------------------------------------------------------------------------------------------------  Recent Labs  01/11/15 2036  TSH 2.469   ------------------------------------------------------------------------------------------------------------------ No results for input(s): VITAMINB12, FOLATE, FERRITIN, TIBC, IRON, RETICCTPCT in the last 72 hours.  Coagulation profile  Recent Labs Lab 01/09/15 1332 01/12/15 0010  INR 1.27 1.26    No results for input(s): DDIMER in the last 72 hours.  Cardiac Enzymes No results for input(s): CKMB, TROPONINI, MYOGLOBIN in the last 168 hours.  Invalid input(s): CK ------------------------------------------------------------------------------------------------------------------ Invalid input(s): POCBNP    Faelyn Sigler D.O. on 01/12/2015 at 12:02 PM  Between 7am to 7pm - Pager - [phone]  After 7pm go to [email or web address] - password [email or web address]  And look for the night coverage person covering for me after hours  [facility] Office  [phone]

## 2015-01-12 NOTE — Progress Notes (Signed)
Patient ID: [name] [name], male   DOB: October 05, 1946, 69 y.o.   MRN: [id number] I have spoken to Mr. Starleen Blue and he understands fully the need to proceed to surgery today.  He does need a left below knee amputation due to overwhelming infection and necrosis of his left foot with severe PVD.  His right foot needs minimal debridement for now of small soft tissue areas of skin breakdown.  I have also spoken to my partner Dr. Eduard Roux, who will try to get Mr. Ben to the OR late this am today.

## 2015-01-12 NOTE — Progress Notes (Signed)
OT Cancellation Note  Patient Details Name: [name] [name] MRN: [id number] DOB: 1946/10/13   Cancelled Treatment:    Reason Eval/Treat Not Completed: Medical issues which prohibited therapy - Pt to OR today for transmetarsal amputation.  Please re-order OT/PT when pt medically cleared for therapies.   Darlina Rumpf [place], OTR/L [id number]  01/12/2015, 10:24 AM

## 2015-01-12 NOTE — Progress Notes (Signed)
Pt Heparin drip stopped at 0750 per order of Dr. Ree Kida. Pt made aware of medical treatment changes.   Fulton Mole, [place] 01/12/2015

## 2015-01-12 NOTE — Transfer of Care (Signed)
Immediate Anesthesia Transfer of Care Note  Patient: [name] [name]  Procedure(s) Performed: Procedure(s): AMPUTATION BELOW KNEE (Left) IRRIGATION AND DEBRIDEMENT FOOT (Right) APPLICATION OF WOUND VAC (Right)  Patient Location: PACU  Anesthesia Type:General  Level of Consciousness: awake, alert  and oriented  Airway & Oxygen Therapy: Patient Spontanous Breathing and Patient connected to nasal cannula oxygen  Post-op Assessment: Report given to RN and Post -op Vital signs reviewed and stable  Post vital signs: Reviewed and stable  Last Vitals:  Filed Vitals:   01/12/15 1012  BP: 101/72  Pulse: 85  Temp:   Resp: 18    Complications: No apparent anesthesia complications

## 2015-01-12 NOTE — Consult Note (Signed)
ORTHOPAEDIC CONSULTATION  REQUESTING PHYSICIAN: Cristal Ford, DO  Chief Complaint: Left foot infection, Right foot wounds  HPI: [name] [name] is a 69 y.o. male with wet gangrene of left foot and wagner stage 3 wound of right foot admitted to the hospital for these conditions.  Dr. Bridgett Larsson of vascular recommended left BKA.  Patient wanted second opinion.  I was asked to take over care for patient.  Past Medical History  Diagnosis Date  . Essential hypertension   . Hyperlipidemia   . Type 2 diabetes mellitus   . Critical lower limb ischemia   . Gangrene of foot March 2016    Left foot  . Cancer     Perostate- Stage I   History reviewed. No pertinent past surgical history. History   Social History  . Marital Status: Single    Spouse Name: N/A  . Number of Children: N/A  . Years of Education: N/A   Social History Main Topics  . Smoking status: Never Smoker   . Smokeless tobacco: Never Used  . Alcohol Use: No  . Drug Use: No  . Sexual Activity: Not on file   Other Topics Concern  . None   Social History Narrative   Family History  Problem Relation Age of Onset  . Hypertension Father     and kidney failure  . Heart disease Father   . Heart attack Father   . Kidney disease Father   . Heart attack Maternal Grandfather   . Heart attack Paternal Grandfather    No Known Allergies Prior to Admission medications   Medication Sig Start Date End Date Taking? Authorizing Provider  alprazolam Duanne Moron) 2 MG tablet Take 2 mg by mouth at bedtime.   Yes Historical Provider, MD  cephALEXin (KEFLEX) 500 MG capsule 1,000 mg 2 (two) times daily. 12/28/14  Yes Historical Provider, MD  HYDROcodone-acetaminophen (NORCO/VICODIN) 5-325 MG per tablet Take 1 tablet by mouth every 4 (four) hours as needed for moderate pain.  01/02/15  Yes Historical Provider, MD  tamsulosin (FLOMAX) 0.4 MG CAPS capsule Take 0.4 mg by mouth daily.   Yes Historical Provider, MD  traZODone (DESYREL) 50 MG  tablet Take 50 mg by mouth at bedtime.   Yes Historical Provider, MD  aspirin EC 81 MG tablet Take 81 mg by mouth daily.    Historical Provider, MD  chlorthalidone (HYGROTON) 25 MG tablet Take 25 mg by mouth daily.    Historical Provider, MD  citalopram (CELEXA) 40 MG tablet Take 40 mg by mouth daily.    Historical Provider, MD  folic acid (FOLVITE) [id number] MCG tablet Take 400 mcg by mouth daily.    Historical Provider, MD  gabapentin (NEURONTIN) 100 MG capsule Take 100 mg by mouth 3 (three) times daily.    Historical Provider, MD  losartan (COZAAR) 100 MG tablet Take 100 mg by mouth daily.    Historical Provider, MD  metFORMIN (GLUCOPHAGE) 500 MG tablet Take 500 mg by mouth 2 (two) times daily.    Historical Provider, MD  metoprolol (LOPRESSOR) 100 MG tablet Take 100 mg by mouth 2 (two) times daily.    Historical Provider, MD  Multiple Vitamin (MULTIVITAMIN) capsule Take 1 capsule by mouth daily.    Historical Provider, MD  Omega-3 Fatty Acids (FISH OIL) 1000 MG CAPS Take 1 capsule by mouth daily.    Historical Provider, MD  simvastatin (ZOCOR) 40 MG tablet Take 40 mg by mouth daily.    Historical Provider, MD  vitamin E 400  UNIT capsule Take 400 Units by mouth daily.    Historical Provider, MD   Dg Foot Complete Left  01/11/2015   CLINICAL DATA:  Cellulitis and open wound on the great toe.  EXAM: LEFT FOOT - COMPLETE 3+ VIEW  COMPARISON:  None.  FINDINGS: There is destruction of the distal aspect of the proximal phalanx of the great toe in the distal phalanx is completely destroyed.  There is also osseous destruction at the PIP joint of the second toe.  There is severe arthritis at the first metatarsal phalangeal joint and there is gas in the soft tissues overlying the entire first metatarsal suggesting gas gangrene. The soft tissue gas extends onto the plantar aspect of the foot underlying the second and third metatarsals.  There is a prominent Haglund deformity at the Achilles insertion on the  calcaneus. There is dorsal spurring at the talonavicular joint.  Prominent soft tissue swelling of forefoot.  IMPRESSION: Osteomyelitis of the proximal and distal phalanges of the great toe. Osteomyelitis of the proximal and middle phalanges of the second toe.  Gas gangrene of the forefoot.   Electronically Signed   By: Lorriane Shire M.D.   On: 01/11/2015 16:48   Dg Foot Complete Right  01/11/2015   CLINICAL DATA:  Cellulitis with open wounds on the first through third toes of the right foot.  EXAM: RIGHT FOOT COMPLETE - 3+ VIEW  COMPARISON:  MRI dated 03/06/2011  FINDINGS: The distal phalanx of the great toe has been amputated. There is severe arthritis of the first metatarsal phalangeal joint. There are no findings of osteomyelitis. There is a Haglund deformity at the insertion of the Achilles tendon on the posterior calcaneus. Small plantar calcaneal spur. Slight dorsal spurring in the midfoot.  IMPRESSION: No radiographic evidence of osteomyelitis.   Electronically Signed   By: Lorriane Shire M.D.   On: 01/11/2015 16:46    Positive ROS: All other systems have been reviewed and were otherwise negative with the exception of those mentioned in the HPI and as above.  Physical Exam: General: Alert, no acute distress Cardiovascular: No pedal edema Respiratory: No cyanosis, no use of accessory musculature GI: No organomegaly, abdomen is soft and non-tender Skin: No lesions in the area of chief complaint Neurologic: Sensation intact distally Psychiatric: Patient is competent for consent with normal mood and affect Lymphatic: No axillary or cervical lymphadenopathy  MUSCULOSKELETAL:  - wet gangrene of left foot with cellulitis, foul odor - wagner stage 3 ulcer of right foot 2 x 2 cm with drainage, minimal cellulitis  Assessment: 1. Left foot gangrene 2. Right foot wound, stage 3  Plan: - needs Left BKA and I&D right foot - patient agrees - consent obtained - patient aware of r/b/a  Thank  you for the consult and the opportunity to see [name] [name] Eduard Roux, MD [place] 1:56 PM

## 2015-01-12 NOTE — Progress Notes (Signed)
Utilization review completed.  

## 2015-01-12 NOTE — Anesthesia Procedure Notes (Signed)
Procedure Name: Intubation Date/Time: 01/12/2015 11:15 AM Performed by: Susa Loffler Pre-anesthesia Checklist: Patient identified, Emergency Drugs available, Suction available, Patient being monitored and Timeout performed Patient Re-evaluated:Patient Re-evaluated prior to inductionOxygen Delivery Method: Circle system utilized Preoxygenation: Pre-oxygenation with 100% oxygen Intubation Type: IV induction Ventilation: Mask ventilation without difficulty and Oral airway inserted - appropriate to patient size Laryngoscope Size: Mac and 4 Grade View: Grade III Tube type: Oral Tube size: 7.5 mm Number of attempts: 1 Airway Equipment and Method: Bougie stylet,  Stylet and Oral airway Placement Confirmation: ETT inserted through vocal cords under direct vision,  positive ETCO2 and breath sounds checked- equal and bilateral Secured at: 22 cm Tube secured with: Tape Dental Injury: Teeth and Oropharynx as per pre-operative assessment  Comments: DL x 1 with mac 4, grade III view. MDA DLx1 with mac 4, 7.5 ETT placed via bougie stylet. +ETCO2, +BBSE. VSS throughout

## 2015-01-12 NOTE — Op Note (Signed)
Date of surgery: 01/12/2015  Preoperative diagnosis:  1. Left foot severe infection with cellulitis, nonsalvageable 2. Right foot Wagner 3 ulcer, 2 x 2 centimeters  Postoperative diagnosis: Same  Procedure:  1. Amputation of extremity through the tibia and fibula with primary closure. 2. Irrigation and debridement of right foot ulcer 2 x 2 centimeters 3. Application of negative wound therapy less than 50 cm  4. Secondary closure of complex right foot wound  Drains: Wound vac  Surgeon: Eduard Roux, M.D.  Anesthesia: General  Estimated blood loss: [id number] cc  Complications: None  Indication for procedure: The patient presents today for the above-mentioned procedure for the above mentioned condition.  The risks, benefits, and alternatives to surgery were discussed with the patient and/or family and they wish to proceed.  Description of procedure: The patient was identified in the preoperative holding area. The patient was brought back to the operating room. The patient was placed supine on the table. General anesthesia was induced. Nonsterile tourniquet placed on the upper thigh. Timeout was performed. Preoperative antibiotics given. A fishmouth incision with a large posterior flap was used. Sharp dissection was taken down through the muscle and the fascia. The neurovascular bundles were identified and ligated with 2-0 silk suture. The tibia was osteotomized approximately 15 cm distal to the joint line. The fibula was osteotomized approximately 1-2 cm proximal to the level of the tibial osteotomy. The tourniquet was deflated. Hemostasis was obtained. The wound was thoroughly irrigated with normal saline.  The wounds closed in layer fashion using 0 PDS for the fascia, 2-0 Monocryl for the deep skin layer, 2-0 nylon for the skin. A sterile dressing was applied. We then turned our attention to the right foot. Full-thickness debridement of the right foot ulcer on the lateral aspect of the fifth  metatarsal head was performed. This was done with a 15 blade. Sharp excisional debridement of the bone, skin, subcutaneous tissue was carried out using a rongeur. The wound was then thoroughly irrigated with normal saline. We then placed a wound VAC on the right foot and turned the suction to -125 mmHg. Patient was extubated and transferred to the PACU in stable condition.  Disposition:  Patient will be nonweightbearing to bilateral lower extremity. The patient will undergo prosthetic fitting once the wound is healed and the swelling has subsided. A wound VAC will be changed every Monday Wednesday and Friday. The patient needs to go home with a home VAC. We will allow the right foot wound to heal by secondary intention.  Azucena Cecil, MD [place] 1:51 PM

## 2015-01-12 NOTE — Progress Notes (Signed)
Pharmacy notified RN that Heparin level was less than therapeutic level, RN told pharmacy that pt did not have Heparin drip running when arrived to the floor or running currently, RN made aware that Heparin was supposed to start at 1816 prior to shift change and arrival to unit, RN notified MD, MD advised RN to start Heparin and stop 3 hours before scheduled surgery this morning, RN started Heparin at 0319, pt made aware and is understanding, pt call bell within reach, bed in lowest position, RN will continue to monitor.  [place]on Mole, [place] 01/12/2015

## 2015-01-13 DIAGNOSIS — I4891 Unspecified atrial fibrillation: Secondary | ICD-10-CM

## 2015-01-13 LAB — BASIC METABOLIC PANEL
Anion gap: 8 (ref 5–15)
BUN: 23 mg/dL (ref 6–23)
CO2: 29 mmol/L (ref 19–32)
CREATININE: 1.28 mg/dL (ref 0.50–1.35)
Calcium: 7.7 mg/dL — ABNORMAL LOW (ref 8.4–10.5)
Chloride: 99 mmol/L (ref 96–112)
GFR calc Af Amer: 64 mL/min — ABNORMAL LOW (ref >90)
GFR, EST NON AFRICAN AMERICAN: 55 mL/min — AB (ref >90)
GLUCOSE: 134 mg/dL — AB (ref 70–99)
Potassium: 3.7 mmol/L (ref 3.5–5.1)
Sodium: 136 mmol/L (ref 135–145)

## 2015-01-13 LAB — HEMOGLOBIN A1C
HEMOGLOBIN A1C: 8.2 % — AB (ref 4.8–5.6)
MEAN PLASMA GLUCOSE: 189 mg/dL

## 2015-01-13 LAB — CBC
HEMATOCRIT: 30.5 % — AB (ref 39.0–52.0)
HEMOGLOBIN: 10 g/dL — AB (ref 13.0–17.0)
MCH: 28.6 pg (ref 26.0–34.0)
MCHC: 32.8 g/dL (ref 30.0–36.0)
MCV: 87.1 fL (ref 78.0–100.0)
Platelets: 317 10*3/uL (ref 150–400)
RBC: 3.5 MIL/uL — AB (ref 4.22–5.81)
RDW: 13.9 % (ref 11.5–15.5)
WBC: 12.4 10*3/uL — ABNORMAL HIGH (ref 4.0–10.5)

## 2015-01-13 LAB — GLUCOSE, CAPILLARY
GLUCOSE-CAPILLARY: 170 mg/dL — AB (ref 70–99)
GLUCOSE-CAPILLARY: 255 mg/dL — AB (ref 70–99)
Glucose-Capillary: 143 mg/dL — ABNORMAL HIGH (ref 70–99)
Glucose-Capillary: 189 mg/dL — ABNORMAL HIGH (ref 70–99)
Glucose-Capillary: 210 mg/dL — ABNORMAL HIGH (ref 70–99)

## 2015-01-13 LAB — HEPARIN LEVEL (UNFRACTIONATED): Heparin Unfractionated: <0.1 IU/mL — ABNORMAL LOW (ref 0.30–0.70)

## 2015-01-13 MED ORDER — RIVAROXABAN 20 MG PO TABS
20.0000 mg | ORAL_TABLET | Freq: Every day | ORAL | Status: DC
Start: 2015-01-13 — End: 2015-01-15
  Administered 2015-01-13 – 2015-01-14 (×2): 20 mg via ORAL
  Filled 2015-01-13 (×3): qty 1

## 2015-01-13 MED ORDER — OXYCODONE HCL 5 MG PO TABS
5.0000 mg | ORAL_TABLET | ORAL | Status: DC | PRN
  Administered 2015-01-15: 10 mg via ORAL
  Filled 2015-01-13 (×2): qty 2

## 2015-01-13 MED ORDER — HEPARIN (PORCINE) IN NACL 100-0.45 UNIT/ML-% IJ SOLN
1250.0000 [IU]/h | INTRAMUSCULAR | Status: DC
  Administered 2015-01-13: 1250 [IU]/h via INTRAVENOUS
  Filled 2015-01-13: qty 250
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
## 2015-01-13 NOTE — Progress Notes (Signed)
  Echocardiogram 2D Echocardiogram has been performed.  [name] [name] 01/13/2015, 11:02 AM

## 2015-01-13 NOTE — Patient Instructions (Signed)
Hip / Knee AROM: Flexion / Extension   Roll to right side. Bring knee to chest while bending knee. Reach limb back behind you as far as possible while straightening knee. Repeat _10___ times. Do _3___ sessions per day.  Copyright  VHI. All rights reserved.  Strengthening: Quadriceps Set   Tighten muscles on top of thighs by pushing knees down into surface. Hold _5___ seconds. Repeat __10__ times per set. Do ___1_ sets per session. Do _3___ sessions per day.  Do both legs at the same time.   http://orth.exer.us/602   Copyright  VHI. All rights reserved.  Strengthening: Straight Leg Raise (Phase 1)   Tighten muscles on front of right thigh, then lift leg __4__ inches from surface, keeping knee locked.  Repeat _10___ times per set. Do __1__ sets per session. Do _3___ sessions per day. Repeat with left leg.   http://orth.exer.us/614   Copyright  VHI. All rights reserved.  Hip Abduction / Adduction: with Extended Knee (Supine)   Bring left leg out to side and return. Keep knee straight. Repeat _10___ times per set. Do __1__ sets per session. Do __3__ sessions per day.  Repeat with right leg.  http://orth.exer.us/680   Copyright  VHI. All rights reserved.  Knee Extension (Sitting)   Place _0___ pound weight on right ankle and straighten knee fully, lower slowly. Repeat __10__ times per set. Do ___1_ sets per session. Do _3___ sessions per day.  Repeat with left leg.   http://orth.exer.us/732   Copyright  VHI. All rights reserved.   ROM: Plantar / Dorsiflexion   With right leg relaxed, gently flex and extend ankle. Move through full range of motion. Avoid pain. Repeat __10__ times per set. Do ___1_ sets per session. Do __3__ sessions per day.  http://orth.exer.us/34   Copyright  VHI. All rights reserved.

## 2015-01-13 NOTE — Progress Notes (Signed)
[facility]                                                                              Patient Demographics  [name] [name], is a 69 y.o. male, DOB - 1946/03/31, [id number]  Admit date - 01/11/2015   Admitting Physician Modena Jansky, MD  Outpatient Primary MD for the patient is [facility], MD  LOS - 2   Chief Complaint  Patient presents with  . Foot Pain      HPI 01/11/2015 by Dr. Vernell Leep [name] [name] is a 69 y.o. male with history of type II DM with peripheral neuropathy, essential hypertension, hyperlipidemia, stage I prostate cancer, atrial flutter status post ablation a year ago for which she was briefly on Xarelto, sent to the ED from vascular surgery/Dr. Chen's office for further evaluation of left foot osteomyelitis and cellulitis. Patient states that he lacks sensation in both feet for the last 5 years. Sometime a month ago, he sustained an injury to the left foot which she did not realize until he started noticing foul smell, purulent drainage admixed with blood. This progressively got worse over the next few weeks. He denies pain. He eventually saw a podiatrist Dr. Barkley Bruns who referred him to see Dr. Quay Burow whom he saw on 3/22 and plans were to get arterial Dopplers and arrange for him to undergo angiography and potential intervention for limb salvage today. He was then referred to see Dr. Bridgett Larsson home he did today and was sent to the ED for evaluation. Patient requested a second opinion and EDP has consulted orthopedics/Dr. Ninfa Linden. Per EDP, patient was transiently in A. fib with RVR and spontaneously reverted to sinus rhythm. Patient denies chest pain, dyspnea, palpitations, dizziness. He states that he had a fever of 102F 2 days ago. He denies any other complaints. In the ED, blood glucose was 241, creatinine 1.74 WBC 20.6, hemoglobin 12.3, x-ray of left foot shows osteomyelitis of the proximal and distal phalanges of the great toe and proximal and middle  phalanges of the second toe and gas gangrene of the forefoot. X-rays of the right foot show no radiographic evidence of osteomyelitis. Hospitalist admission requested.  Assessment & Plan   Sepsis secondary to Osteomyelitis of the left foot/critical limb ischemia -Patient had leukocytosis with tachycardia upon admission, leukocytosis trending downward, tachycardia resolved -Vascular surgery consulted, left foot is not salvageable, vascular surgery did recommend repair however patient wanted second opinion -Orthopedic surgery consulted and appreciated, s/p Left BKA -Continue IV vancomycin and Zosyn -Will consult PT/OT   Paroxysmal atrial fibrillation with RVR -Status post ablation in 2015 by Dr. Georg Ruddle in [place] -Was on Xarelto at that time -TSH 2.469 -Echocardiogram pending -ChadsVASC 4 -Continue heparin  -Continue metoprolol (currently rate controlled)  Essential hypertension  -Continue metoprolol -ARB and diuretic held due to acute on chronic kidney disease  Diabetes mellitus, type II, with neuropathy/diabetic foot infection -Treatment plan as above for infection -Hemoglobin A1c 8.2 -Continue Lantus, and complaints go CBG monitoring -Wound care for right foot infection  Acute on chronic renal failure, ?Stage 3 -Resolved, Unknown baseline creatinine, last known creatinine of 3/22 1.5 -Currently Cr 1.28 -Continue to monitor BMP  Hyperlipidemia -Continue  statin  Hypokalemia  -Resolved, continue to monitor BMP and replace as needed  Code Status: Full  Family Communication: None at bedside  Disposition Plan: Admitted, pending surgery today  Time Spent in minutes   30 minutes  Procedures  LBKA  Consults   Orthopedic Surgery   DVT Prophylaxis  Heparin held for surgery  Lab Results  Component Value Date   PLT 317 01/13/2015    Medications  Scheduled Meds: . alprazolam  2 mg Oral QHS  . aspirin EC  81 mg Oral Daily  . Chlorhexidine Gluconate Cloth  6  each Topical Daily  . citalopram  40 mg Oral Daily  . folic acid  [id number] mcg Oral Daily  . gabapentin  100 mg Oral TID  . insulin aspart  0-5 Units Subcutaneous QHS  . insulin aspart  0-9 Units Subcutaneous TID WC  . insulin glargine  15 Units Subcutaneous QHS  . metoprolol  100 mg Oral BID  . multivitamin with minerals  1 tablet Oral Daily  . mupirocin ointment  1 application Nasal BID  . piperacillin-tazobactam (ZOSYN)  IV  3.375 g Intravenous Q8H  . simvastatin  40 mg Oral q1800  . sodium chloride  3 mL Intravenous Q12H  . tamsulosin  0.4 mg Oral Daily  . traZODone  50 mg Oral QHS  . vancomycin  1,250 mg Intravenous Q24H   Continuous Infusions: . heparin Stopped (01/12/15 0750)  . lactated ringers 10 mL/hr at 01/12/15 1010   PRN Meds:.acetaminophen **OR** acetaminophen, albuterol, HYDROmorphone (DILAUDID) injection, ondansetron **OR** ondansetron (ZOFRAN) IV  Antibiotics   Anti-infectives    Start     Dose/Rate Route Frequency Ordered Stop   01/12/15 1630  vancomycin (VANCOCIN) 1,250 mg in sodium chloride 0.9 % 250 mL IVPB     1,250 mg 166.7 mL/hr over 90 Minutes Intravenous Every 24 hours 01/11/15 1556     01/12/15 1200  piperacillin-tazobactam (ZOSYN) IVPB 3.375 g     3.375 g 12.5 mL/hr over 240 Minutes Intravenous Every 8 hours 01/12/15 1101     01/11/15 1630  vancomycin (VANCOCIN) 1,750 mg in sodium chloride 0.9 % 500 mL IVPB     1,750 mg 250 mL/hr over 120 Minutes Intravenous  Once 01/11/15 1556 01/11/15 1817   01/11/15 1600  piperacillin-tazobactam (ZOSYN) IVPB 3.375 g     3.375 g 100 mL/hr over 30 Minutes Intravenous  Once 01/11/15 1548 01/11/15 1725      Subjective:   [name] [name] seen and examined today. Patient states that he is tired and had some pain overnight.  He denies any at this time.  Feels he did not sleep well due to his pain.  Denies chest pain, shortness of breath.  Objective:   Filed Vitals:   01/12/15 1417 01/12/15 1437 01/12/15 2100 01/13/15  0500  BP: 108/65 103/69 93/61 134/78  Pulse: 89 82 97 103  Temp: 97.9 F (36.6 C) 98 F (36.7 C) 98.1 F (36.7 C) 97.6 F (36.4 C)  TempSrc: Oral Oral Oral Oral  Resp: 16 16 16 17   Height:      Weight:    100.6 kg (221 lb 12.5 oz)  SpO2: 98% 98% 95% 95%    Wt Readings from Last 3 Encounters:  01/13/15 100.6 kg (221 lb 12.5 oz)  01/11/15 97.977 kg (216 lb)  01/09/15 100.245 kg (221 lb)     Intake/Output Summary (Last 24 hours) at 01/13/15 1027 Last data filed at 01/13/15 0700  Gross per 24 hour  Intake    800 ml  Output    535 ml  Net    265 ml    Exam  General: Well developed, well nourished, NAD  HEENT: NCAT,mucous membranes moist.   Cardiovascular: S1 S2 auscultated, irregular  Respiratory: Clear to auscultation  Abdomen: Soft, obese, nontender, nondistended, + bowel sounds  Extremities: LBKA, right great toe distal phalanx, dry gangrene on the second and fourth toes.  Neuro: AAOx3, nonfocal  Data Review   Micro Results Recent Results (from the past 240 hour(s))  Blood Culture (routine x 2)     Status: None (Preliminary result)   Collection Time: 01/11/15  2:02 PM  Result Value Ref Range Status   Specimen Description BLOOD RIGHT ARM  Final   Special Requests BOTTLES DRAWN AEROBIC ONLY 4CCS  Final   Culture   Final           BLOOD CULTURE RECEIVED NO GROWTH TO DATE CULTURE WILL BE HELD FOR 5 DAYS BEFORE ISSUING A FINAL NEGATIVE REPORT Performed at [facility]    Report Status PENDING  Incomplete  Blood Culture (routine x 2)     Status: None (Preliminary result)   Collection Time: 01/11/15  4:15 PM  Result Value Ref Range Status   Specimen Description BLOOD RIGHT WRIST  Final   Special Requests BOTTLES DRAWN AEROBIC AND ANAEROBIC 5CCS  Final   Culture   Final           BLOOD CULTURE RECEIVED NO GROWTH TO DATE CULTURE WILL BE HELD FOR 5 DAYS BEFORE ISSUING A FINAL NEGATIVE REPORT Performed at [facility]    Report Status PENDING   Incomplete  Surgical pcr screen     Status: Abnormal   Collection Time: 01/11/15 11:10 PM  Result Value Ref Range Status   MRSA, PCR NEGATIVE NEGATIVE Final   Staphylococcus aureus POSITIVE (A) NEGATIVE Final    Comment:        The Xpert SA Assay (FDA approved for NASAL specimens in patients over 69 years of age), is one component of a comprehensive surveillance program.  Test performance has been validated by [facility] for patients greater than or equal to 21 year old. It is not intended to diagnose infection nor to guide or monitor treatment.     Radiology Reports Dg Foot Complete Left  01/11/2015   CLINICAL DATA:  Cellulitis and open wound on the great toe.  EXAM: LEFT FOOT - COMPLETE 3+ VIEW  COMPARISON:  None.  FINDINGS: There is destruction of the distal aspect of the proximal phalanx of the great toe in the distal phalanx is completely destroyed.  There is also osseous destruction at the PIP joint of the second toe.  There is severe arthritis at the first metatarsal phalangeal joint and there is gas in the soft tissues overlying the entire first metatarsal suggesting gas gangrene. The soft tissue gas extends onto the plantar aspect of the foot underlying the second and third metatarsals.  There is a prominent Haglund deformity at the Achilles insertion on the calcaneus. There is dorsal spurring at the talonavicular joint.  Prominent soft tissue swelling of forefoot.  IMPRESSION: Osteomyelitis of the proximal and distal phalanges of the great toe. Osteomyelitis of the proximal and middle phalanges of the second toe.  Gas gangrene of the forefoot.   Electronically Signed   By: Lorriane Shire M.D.   On: 01/11/2015 16:48   Dg Foot Complete Right  01/11/2015   CLINICAL DATA:  Cellulitis with open wounds on the first through third toes of the right foot.  EXAM: RIGHT FOOT COMPLETE - 3+ VIEW  COMPARISON:  MRI dated 03/06/2011  FINDINGS: The distal phalanx of the great toe has been  amputated. There is severe arthritis of the first metatarsal phalangeal joint. There are no findings of osteomyelitis. There is a Haglund deformity at the insertion of the Achilles tendon on the posterior calcaneus. Small plantar calcaneal spur. Slight dorsal spurring in the midfoot.  IMPRESSION: No radiographic evidence of osteomyelitis.   Electronically Signed   By: Lorriane Shire M.D.   On: 01/11/2015 16:46    CBC  Recent Labs Lab 01/09/15 1332 01/11/15 1402 01/12/15 0335 01/13/15 0353  WBC 19.0* 20.6* 13.8* 12.4*  HGB 12.1* 12.3* 10.9* 10.0*  HCT 35.7* 35.4* 32.3* 30.5*  PLT 292 295 284 317  MCV 87.3 85.5 84.8 87.1  MCH 29.6 29.7 28.6 28.6  MCHC 33.9 34.7 33.7 32.8  RDW 13.4 13.6 13.7 13.9    Chemistries   Recent Labs Lab 01/09/15 1332 01/11/15 1402 01/12/15 0335 01/13/15 0353  NA 134* 133* 134* 136  K 4.1 3.5 3.0* 3.7  CL 93* 93* 100 99  CO2 24 24 26 29   GLUCOSE 263* 241* 128* 134*  BUN 29* 39* 37* 23  CREATININE 1.54* 1.74* 1.38* 1.28  CALCIUM 8.9 8.5 7.8* 7.7*   ------------------------------------------------------------------------------------------------------------------ estimated creatinine clearance is 61.6 mL/min (by C-G formula based on Cr of 1.28). ------------------------------------------------------------------------------------------------------------------  Recent Labs  01/11/15 2036  HGBA1C 8.2*   ------------------------------------------------------------------------------------------------------------------ No results for input(s): CHOL, HDL, LDLCALC, TRIG, CHOLHDL, LDLDIRECT in the last 72 hours. ------------------------------------------------------------------------------------------------------------------  Recent Labs  01/11/15 2036  TSH 2.469   ------------------------------------------------------------------------------------------------------------------ No results for input(s): VITAMINB12, FOLATE, FERRITIN, TIBC, IRON,  RETICCTPCT in the last 72 hours.  Coagulation profile  Recent Labs Lab 01/09/15 1332 01/12/15 0010  INR 1.27 1.26    No results for input(s): DDIMER in the last 72 hours.  Cardiac Enzymes No results for input(s): CKMB, TROPONINI, MYOGLOBIN in the last 168 hours.  Invalid input(s): CK ------------------------------------------------------------------------------------------------------------------ Invalid input(s): POCBNP    Ginna Schuur D.O. on 01/13/2015 at 10:27 AM  Between 7am to 7pm - Pager - [phone]  After 7pm go to [email or web address] - password [email or web address]  And look for the night coverage person covering for me after hours  [facility] Office  [phone]

## 2015-01-13 NOTE — Progress Notes (Addendum)
ANTICOAGULATION CONSULT NOTE - Initial Consult  Pharmacy Consult for heparin Indication: atrial fibrillation  No Known Allergies  Patient Measurements: Height: 5\' 7"  (170.2 cm) Weight: 221 lb 12.5 oz (100.6 kg) IBW/kg (Calculated) : 66.1 Heparin Dosing Weight: 87 kg  Vital Signs: Temp: 97.6 F (36.4 C) (03/26 0500) Temp Source: Oral (03/26 0500) BP: 134/78 mmHg (03/26 0500) Pulse Rate: 103 (03/26 0500)  Labs:  Recent Labs  01/11/15 1402 01/12/15 0010 01/12/15 0335 01/12/15 0942 01/13/15 0353  HGB 12.3*  --  10.9*  --  10.0*  HCT 35.4*  --  32.3*  --  30.5*  PLT 295  --  284  --  317  LABPROT  --  15.9*  --   --   --   INR  --  1.26  --   --   --   HEPARINUNFRC  --  <0.10*  --  <0.10* <0.10*  CREATININE 1.74*  --  1.38*  --  1.28    Estimated Creatinine Clearance: 61.6 mL/min (by C-G formula based on Cr of 1.28).   Medical History: Past Medical History  Diagnosis Date  . Essential hypertension   . Hyperlipidemia   . Type 2 diabetes mellitus   . Critical lower limb ischemia   . Gangrene of foot March 2016    Left foot  . Cancer     Perostate- Stage I    Medications:  Infusions:  . heparin    . lactated ringers 10 mL/hr at 01/12/15 1010    Assessment: 69 yo male admitted with critical limb ischemia now s/p partial amputaton of L tibia/fibula. On heparin gtt for pAFib. Pt had ablation in 2015 and complete course of Xarelto at that time. Heparin was held peri-operatively and will be resumed this afternoon, ~ 24 hours post-op.  Goal of Therapy:  Heparin level 0.3-0.7 units/ml Monitor platelets by anticoagulation protocol: Yes   Plan:  -Heparin 1250 units/hr -Daily HL, CBC  -1st level this evening -F/u transition to PO anticoagulation    Hughes Better, PharmD, BCPS Clinical Pharmacist Pager: [phone] 01/13/2015 12:14 PM     Addendum -Transitioning to Xarelto  -Turn off heparin at the same time first dose of Xarelto is given -Xarelto 20 mg  po daily -CBC q72h   Harvel Quale 01/13/2015 4:55 PM

## 2015-01-14 LAB — BASIC METABOLIC PANEL
ANION GAP: 7 (ref 5–15)
BUN: 17 mg/dL (ref 6–23)
CO2: 28 mmol/L (ref 19–32)
Calcium: 7.7 mg/dL — ABNORMAL LOW (ref 8.4–10.5)
Chloride: 102 mmol/L (ref 96–112)
Creatinine, Ser: 1.23 mg/dL (ref 0.50–1.35)
GFR calc non Af Amer: 58 mL/min — ABNORMAL LOW (ref >90)
GFR, EST AFRICAN AMERICAN: 67 mL/min — AB (ref >90)
Glucose, Bld: 138 mg/dL — ABNORMAL HIGH (ref 70–99)
Potassium: 3.7 mmol/L (ref 3.5–5.1)
SODIUM: 137 mmol/L (ref 135–145)

## 2015-01-14 LAB — GLUCOSE, CAPILLARY
GLUCOSE-CAPILLARY: 151 mg/dL — AB (ref 70–99)
Glucose-Capillary: 203 mg/dL — ABNORMAL HIGH (ref 70–99)

## 2015-01-14 LAB — CBC
HCT: 30.3 % — ABNORMAL LOW (ref 39.0–52.0)
Hemoglobin: 10 g/dL — ABNORMAL LOW (ref 13.0–17.0)
MCH: 28.9 pg (ref 26.0–34.0)
MCHC: 33 g/dL (ref 30.0–36.0)
MCV: 87.6 fL (ref 78.0–100.0)
Platelets: 364 10*3/uL (ref 150–400)
RBC: 3.46 MIL/uL — AB (ref 4.22–5.81)
RDW: 14 % (ref 11.5–15.5)
WBC: 13.2 10*3/uL — ABNORMAL HIGH (ref 4.0–10.5)

## 2015-01-14 MED ORDER — HYDROCODONE-ACETAMINOPHEN 5-325 MG PO TABS: 1.0000 | ORAL_TABLET | ORAL | Status: DC | PRN

## 2015-01-14 MED ORDER — RIVAROXABAN (XARELTO) EDUCATION KIT FOR DVT/PE PATIENTS
PACK | Freq: Once | Status: DC
  Filled 2015-01-14: qty 1

## 2015-01-14 NOTE — Clinical Social Work Placement (Signed)
Clinical Social Work Department CLINICAL SOCIAL WORK PLACEMENT NOTE 01/14/2015  Patient:  BRACYN, AMICONE  Account Number:  [id number] [place] date:  01/11/2015  Clinical Social Worker:  Kemper Durie, [place]  Date/time:  01/14/2015 01:39 PM  Clinical Social Work is seeking post-discharge placement for this patient at the following level of care:   SKILLED NURSING   (*CSW will update this form in Epic as items are completed)   01/14/2015  Patient/family provided with [place] Department of Clinical Social Work's list of facilities offering this level of care within the geographic area requested by the patient (or if unable, by the patient's family).  01/14/2015  Patient/family informed of their freedom to choose among providers that offer the needed level of care, that participate in Medicare, Medicaid or managed care program needed by the patient, have an available bed and are willing to accept the patient.  01/14/2015  Patient/family informed of [facility]' ownership interest in [facility], as well as of the fact that they are under no obligation to receive care at this facility.  PASARR submitted to EDS on 01/14/2015 PASARR number received on 01/14/2015  FL2 transmitted to all facilities in geographic area requested by pt/family on  01/14/2015 FL2 transmitted to all facilities within larger geographic area on   Patient informed that his/her managed care company has contracts with or will negotiate with  certain facilities, including the following:     Patient/family informed of bed offers received:   Patient chooses bed at  Physician recommends and patient chooses bed at    Patient to be transferred to  on   Patient to be transferred to facility by  Patient and family notified of transfer on  Name of family member notified:    The following physician request were entered in Epic:   Additional Comments:    Liz Beach MSW, [place], [place],  [id number]

## 2015-01-14 NOTE — Clinical Social Work Psychosocial (Signed)
Clinical Social Work Department BRIEF PSYCHOSOCIAL ASSESSMENT 01/14/2015  Patient:  [name] [name], [name] [name]     Account Number:  [id number]     Admit date:  01/11/2015  Clinical Social Worker:  Lovey Newcomer  Date/Time:  01/14/2015 01:32 PM  Referred by:  Physician  Date Referred:  01/14/2015 Referred for  SNF Placement   Other Referral:   NA   Interview type:  Patient Other interview type:   Patient alert and oriented at time of assessment.    PSYCHOSOCIAL DATA Living Status:  ALONE Admitted from facility:   Level of care:   Primary support name:  Mary Primary support relationship to patient:  SIBLING Degree of support available:   Support is adequate.    CURRENT CONCERNS Current Concerns  Post-Acute Placement   Other Concerns:   NA    SOCIAL WORK ASSESSMENT / PLAN CSW met with patient at bedside to complete assessment. Patient present with flat affect and shows little interest in engaging with CSW for assessment. Patient remains lying down during assessment and has poor eye contact with CSW. Patient reports that he is from home alone but does have family support from his sisters and brother. When asked if he would like to DC to a SNF when medically stable the patient said he is agreeable to this and has a preference for [facility] in [place]  since this is where he lives. CSW explained SNF search/placement process and answered questions. Not much more was discussed with patient as he seemed to doze off to sleep quite frequently. CSW will assist with placement and follow up with bed offers when available.   Assessment/plan status:  Psychosocial Support/Ongoing Assessment of Needs Other assessment/ plan:   Complete Fl2, Fax, PASRR   Information/referral to community resources:   CSW contact information and SNF list given.    PATIENT'S/FAMILY'S RESPONSE TO PLAN OF CARE: Patient would like to DC to [facility] when medically stable for wound care and rehab. CSW  notes that patient is requiring a wound vac, with a vac change scheduled for 3/28. CSW will inform facilities of this need. CSW will assist.       Liz Beach MSW, [place], [place], [phone]

## 2015-01-14 NOTE — Progress Notes (Signed)
ANTIBIOTIC CONSULT NOTE - INITIAL  Pharmacy Consult for Vancomycin and Zosyn Indication: rule out sepsis  No Known Allergies  Patient Measurements: Height: 5\' 7"  (170.2 cm) Weight: 221 lb 12.5 oz (100.6 kg) IBW/kg (Calculated) : 66.1 Actual Body Weight: 98 kg  Vital Signs: Temp: 97.5 F (36.4 C) (03/27 1353) Temp Source: Oral (03/27 1353) BP: 104/75 mmHg (03/27 1353) Pulse Rate: 60 (03/27 1353) Intake/Output from previous day: 03/26 0701 - 03/27 0700 In: 120 [P.O.:120] Out: 2050 [Urine:2050] Intake/Output from this shift: Total I/O In: -  Out: 800 [Urine:800]  Labs:  Recent Labs  01/12/15 0335 01/13/15 0353 01/14/15 0450 01/14/15 0729  WBC 13.8* 12.4*  --  13.2*  HGB 10.9* 10.0*  --  10.0*  PLT 284 317  --  364  CREATININE 1.38* 1.28 1.23  --    Estimated Creatinine Clearance: 64.1 mL/min (by C-G formula based on Cr of 1.23). No results for input(s): VANCOTROUGH, VANCOPEAK, VANCORANDOM, GENTTROUGH, GENTPEAK, GENTRANDOM, TOBRATROUGH, TOBRAPEAK, TOBRARND, AMIKACINPEAK, AMIKACINTROU, AMIKACIN in the last 72 hours.   Medical History: Past Medical History  Diagnosis Date  . Essential hypertension   . Hyperlipidemia   . Type 2 diabetes mellitus   . Critical lower limb ischemia   . Gangrene of foot March 2016    Left foot  . Cancer     Perostate- Stage I    Medications:  Prescriptions prior to admission  Medication Sig Dispense Refill Last Dose  . alprazolam (XANAX) 2 MG tablet Take 2 mg by mouth at bedtime.   01/10/2015 at Unknown time  . cephALEXin (KEFLEX) 500 MG capsule 1,000 mg 2 (two) times daily.   01/10/2015 at Unknown time  . HYDROcodone-acetaminophen (NORCO/VICODIN) 5-325 MG per tablet Take 1 tablet by mouth every 4 (four) hours as needed for moderate pain.    01/10/2015 at Unknown time  . tamsulosin (FLOMAX) 0.4 MG CAPS capsule Take 0.4 mg by mouth daily.   01/07/2015 at Unknown time  . traZODone (DESYREL) 50 MG tablet Take 50 mg by mouth at bedtime.    01/10/2015 at Unknown time  . aspirin EC 81 MG tablet Take 81 mg by mouth daily.   01/07/2015  . chlorthalidone (HYGROTON) 25 MG tablet Take 25 mg by mouth daily.   01/07/2015  . citalopram (CELEXA) 40 MG tablet Take 40 mg by mouth daily.   01/04/2015 at Unknown time  . folic acid (FOLVITE) [id number] MCG tablet Take 400 mcg by mouth daily.   01/07/2015  . gabapentin (NEURONTIN) 100 MG capsule Take 100 mg by mouth 3 (three) times daily.   01/02/2015  . losartan (COZAAR) 100 MG tablet Take 100 mg by mouth daily.   01/07/2015  . metFORMIN (GLUCOPHAGE) 500 MG tablet Take 500 mg by mouth 2 (two) times daily.   01/07/2015  . metoprolol (LOPRESSOR) 100 MG tablet Take 100 mg by mouth 2 (two) times daily.   01/07/2015 at 1600  . Multiple Vitamin (MULTIVITAMIN) capsule Take 1 capsule by mouth daily.   01/07/2015  . Omega-3 Fatty Acids (FISH OIL) 1000 MG CAPS Take 1 capsule by mouth daily.   01/07/2015  . simvastatin (ZOCOR) 40 MG tablet Take 40 mg by mouth daily.   01/07/2015  . vitamin E 400 UNIT capsule Take 400 Units by mouth daily.   01/07/2015   Scheduled:  . alprazolam  2 mg Oral QHS  . aspirin EC  81 mg Oral Daily  . Chlorhexidine Gluconate Cloth  6 each Topical Daily  . citalopram  40 mg Oral Daily  . folic acid  [id number] mcg Oral Daily  . gabapentin  100 mg Oral TID  . insulin aspart  0-5 Units Subcutaneous QHS  . insulin aspart  0-9 Units Subcutaneous TID WC  . insulin glargine  15 Units Subcutaneous QHS  . metoprolol  100 mg Oral BID  . multivitamin with minerals  1 tablet Oral Daily  . mupirocin ointment  1 application Nasal BID  . piperacillin-tazobactam (ZOSYN)  IV  3.375 g Intravenous Q8H  . rivaroxaban  20 mg Oral Q supper  . simvastatin  40 mg Oral q1800  . sodium chloride  3 mL Intravenous Q12H  . tamsulosin  0.4 mg Oral Daily  . traZODone  50 mg Oral QHS  . vancomycin  1,250 mg Intravenous Q24H   Infusions:  . lactated ringers 10 mL/hr at 01/12/15 1010   Assessment: 69 yo male with history of  DM2, gangrenous L foot, HTN and HLD admitted with foot pain. Pt with L critical foot ischemia, s/p L BKA, R foot I&D now with wound vac on R foot. Day #4 vanc/zosyn, mild leukocytosis, SCr 1.23, eCrCl 60-65 ml/min.  Goal of Therapy:  Vancomycin trough level 15-20 mcg/ml  Plan:  Vancomycin 1250mg  IV q24h Zosyn 3.375g IV q8h VT as indicated Follow up culture results, renal function, length of therapy    Hughes Better, PharmD, BCPS Clinical Pharmacist Pager: [phone] 01/14/2015 2:18 PM

## 2015-01-14 NOTE — Discharge Instructions (Signed)

## 2015-01-14 NOTE — Progress Notes (Signed)
Patient ID: [name] [name], male   DOB: Apr 08, 1946, 69 y.o.   MRN: [id number] [facility]  Sucking without leak but has had some problems with blockage. VAC change Monday.

## 2015-01-14 NOTE — Progress Notes (Signed)
[facility]                                                                              Patient Demographics  [name] [name], is a 69 y.o. male, DOB - 08-18-1946, [id number]  Admit date - 01/11/2015   Admitting Physician Modena Jansky, MD  Outpatient Primary MD for the patient is [facility], MD  LOS - 3   Chief Complaint  Patient presents with  . Foot Pain      HPI 01/11/2015 by Dr. Vernell Leep [name] [name] is a 69 y.o. male with history of type II DM with peripheral neuropathy, essential hypertension, hyperlipidemia, stage I prostate cancer, atrial flutter status post ablation a year ago for which she was briefly on Xarelto, sent to the ED from vascular surgery/Dr. Chen's office for further evaluation of left foot osteomyelitis and cellulitis. Patient states that he lacks sensation in both feet for the last 5 years. Sometime a month ago, he sustained an injury to the left foot which she did not realize until he started noticing foul smell, purulent drainage admixed with blood. This progressively got worse over the next few weeks. He denies pain. He eventually saw a podiatrist Dr. Barkley Bruns who referred him to see Dr. Quay Burow whom he saw on 3/22 and plans were to get arterial Dopplers and arrange for him to undergo angiography and potential intervention for limb salvage today. He was then referred to see Dr. Bridgett Larsson home he did today and was sent to the ED for evaluation. Patient requested a second opinion and EDP has consulted orthopedics/Dr. Ninfa Linden. Per EDP, patient was transiently in A. fib with RVR and spontaneously reverted to sinus rhythm. Patient denies chest pain, dyspnea, palpitations, dizziness. He states that he had a fever of 102F 2 days ago. He denies any other complaints. In the ED, blood glucose was 241, creatinine 1.74 WBC 20.6, hemoglobin 12.3, x-ray of left foot shows osteomyelitis of the proximal and distal phalanges of the great toe and proximal and middle  phalanges of the second toe and gas gangrene of the forefoot. X-rays of the right foot show no radiographic evidence of osteomyelitis. Hospitalist admission requested.  Assessment & Plan   Sepsis secondary to Osteomyelitis of the left foot/critical limb ischemia -Patient had leukocytosis with tachycardia upon admission, leukocytosis trending downward, tachycardia resolved -Vascular surgery consulted, left foot is not salvageable, vascular surgery did recommend repair however patient wanted second opinion -Orthopedic surgery consulted and appreciated, s/p Left BKA -Continue IV vancomycin and Zosyn -PT consulted and recommend SNF  Paroxysmal atrial fibrillation with RVR -Status post ablation in 2015 by Dr. Georg Ruddle in [place] -Was on Xarelto at that time -TSH 2.469 -Echocardiogram: EF 55-60% -ChadsVASC 4 -Initially placed on heparin due to surgery; transitioned to xarelto 3/26 -Continue metoprolol (currently rate controlled)  Essential hypertension  -Continue metoprolol -ARB and diuretic held due to acute on chronic kidney disease  Diabetes mellitus, type II, with neuropathy/diabetic foot infection -Treatment plan as above for infection -Hemoglobin A1c 8.2 -Continue Lantus, and complaints go CBG monitoring -Wound care for right foot infection -Wound vac in place for right foot, will be exchanged on Monday  Acute on chronic renal failure, ?  Stage 3 -Resolved, Unknown baseline creatinine, last known creatinine of 3/22 1.5 -Currently Cr 1.23 -Continue to monitor BMP  Hyperlipidemia -Continue statin  Hypokalemia  -Resolved, continue to monitor BMP and replace as needed  Code Status: Full  Family Communication: None at bedside  Disposition Plan: Admitted, pending SNF and wound vac replacement  Time Spent in minutes   30 minutes  Procedures  LBKA Wound vac on right foot  Consults   Orthopedic Surgery   DVT Prophylaxis  Xarelto  Lab Results  Component Value Date    PLT 364 01/14/2015    Medications  Scheduled Meds: . alprazolam  2 mg Oral QHS  . aspirin EC  81 mg Oral Daily  . Chlorhexidine Gluconate Cloth  6 each Topical Daily  . citalopram  40 mg Oral Daily  . folic acid  [id number] mcg Oral Daily  . gabapentin  100 mg Oral TID  . insulin aspart  0-5 Units Subcutaneous QHS  . insulin aspart  0-9 Units Subcutaneous TID WC  . insulin glargine  15 Units Subcutaneous QHS  . metoprolol  100 mg Oral BID  . multivitamin with minerals  1 tablet Oral Daily  . mupirocin ointment  1 application Nasal BID  . piperacillin-tazobactam (ZOSYN)  IV  3.375 g Intravenous Q8H  . rivaroxaban  20 mg Oral Q supper  . simvastatin  40 mg Oral q1800  . sodium chloride  3 mL Intravenous Q12H  . tamsulosin  0.4 mg Oral Daily  . traZODone  50 mg Oral QHS  . vancomycin  1,250 mg Intravenous Q24H   Continuous Infusions: . lactated ringers 10 mL/hr at 01/12/15 1010   PRN Meds:.acetaminophen **OR** acetaminophen, albuterol, ondansetron **OR** ondansetron (ZOFRAN) IV, oxyCODONE  Antibiotics   Anti-infectives    Start     Dose/Rate Route Frequency Ordered Stop   01/12/15 1630  vancomycin (VANCOCIN) 1,250 mg in sodium chloride 0.9 % 250 mL IVPB     1,250 mg 166.7 mL/hr over 90 Minutes Intravenous Every 24 hours 01/11/15 1556     01/12/15 1200  piperacillin-tazobactam (ZOSYN) IVPB 3.375 g     3.375 g 12.5 mL/hr over 240 Minutes Intravenous Every 8 hours 01/12/15 1101     01/11/15 1630  vancomycin (VANCOCIN) 1,750 mg in sodium chloride 0.9 % 500 mL IVPB     1,750 mg 250 mL/hr over 120 Minutes Intravenous  Once 01/11/15 1556 01/11/15 1817   01/11/15 1600  piperacillin-tazobactam (ZOSYN) IVPB 3.375 g     3.375 g 100 mL/hr over 30 Minutes Intravenous  Once 01/11/15 1548 01/11/15 1725      Subjective:   [name] [name] seen and examined today. Patient states that he is tired and had some pain overnight and states his wound vac lost suction.  He denies chest pain, SOB,  abdominal pain. Wonders how he will walk.    Objective:   Filed Vitals:   01/13/15 0500 01/13/15 1500 01/13/15 2040 01/14/15 0422  BP: 134/78 112/64 109/64 98/64  Pulse: 103 70 71 62  Temp: 97.6 F (36.4 C) 97.9 F (36.6 C) 98.4 F (36.9 C) 98.1 F (36.7 C)  TempSrc: Oral Oral Oral Oral  Resp: 17 18 18 18   Height:      Weight: 100.6 kg (221 lb 12.5 oz)     SpO2: 95% 97% 95% 95%    Wt Readings from Last 3 Encounters:  01/13/15 100.6 kg (221 lb 12.5 oz)  01/11/15 97.977 kg (216 lb)  01/09/15 100.245 kg (221  lb)     Intake/Output Summary (Last 24 hours) at 01/14/15 1054 Last data filed at 01/14/15 0423  Gross per 24 hour  Intake      0 ml  Output   2050 ml  Net  -2050 ml    Exam  General: Well developed, well nourished  Cardiovascular: S1 S2 auscultated, irregular  Respiratory: Clear to auscultation  Abdomen: Soft, obese, nontender, nondistended, + bowel sounds  Extremities: LBKA, right great toe distal phalanx, dry gangrene on the second and fourth toes; wound vac  Neuro: AAOx3, nonfocal  Data Review   Micro Results Recent Results (from the past 240 hour(s))  Blood Culture (routine x 2)     Status: None (Preliminary result)   Collection Time: 01/11/15  2:02 PM  Result Value Ref Range Status   Specimen Description BLOOD RIGHT ARM  Final   Special Requests BOTTLES DRAWN AEROBIC ONLY 4CCS  Final   Culture   Final           BLOOD CULTURE RECEIVED NO GROWTH TO DATE CULTURE WILL BE HELD FOR 5 DAYS BEFORE ISSUING A FINAL NEGATIVE REPORT Performed at [facility]    Report Status PENDING  Incomplete  Blood Culture (routine x 2)     Status: None (Preliminary result)   Collection Time: 01/11/15  4:15 PM  Result Value Ref Range Status   Specimen Description BLOOD RIGHT WRIST  Final   Special Requests BOTTLES DRAWN AEROBIC AND ANAEROBIC 5CCS  Final   Culture   Final           BLOOD CULTURE RECEIVED NO GROWTH TO DATE CULTURE WILL BE HELD FOR 5 DAYS BEFORE  ISSUING A FINAL NEGATIVE REPORT Performed at [facility]    Report Status PENDING  Incomplete  Surgical pcr screen     Status: Abnormal   Collection Time: 01/11/15 11:10 PM  Result Value Ref Range Status   MRSA, PCR NEGATIVE NEGATIVE Final   Staphylococcus aureus POSITIVE (A) NEGATIVE Final    Comment:        The Xpert SA Assay (FDA approved for NASAL specimens in patients over 10 years of age), is one component of a comprehensive surveillance program.  Test performance has been validated by [facility] for patients greater than or equal to 9 year old. It is not intended to diagnose infection nor to guide or monitor treatment.     Radiology Reports Dg Foot Complete Left  01/11/2015   CLINICAL DATA:  Cellulitis and open wound on the great toe.  EXAM: LEFT FOOT - COMPLETE 3+ VIEW  COMPARISON:  None.  FINDINGS: There is destruction of the distal aspect of the proximal phalanx of the great toe in the distal phalanx is completely destroyed.  There is also osseous destruction at the PIP joint of the second toe.  There is severe arthritis at the first metatarsal phalangeal joint and there is gas in the soft tissues overlying the entire first metatarsal suggesting gas gangrene. The soft tissue gas extends onto the plantar aspect of the foot underlying the second and third metatarsals.  There is a prominent Haglund deformity at the Achilles insertion on the calcaneus. There is dorsal spurring at the talonavicular joint.  Prominent soft tissue swelling of forefoot.  IMPRESSION: Osteomyelitis of the proximal and distal phalanges of the great toe. Osteomyelitis of the proximal and middle phalanges of the second toe.  Gas gangrene of the forefoot.   Electronically Signed   By: Lorriane Shire  M.D.   On: 01/11/2015 16:48   Dg Foot Complete Right  01/11/2015   CLINICAL DATA:  Cellulitis with open wounds on the first through third toes of the right foot.  EXAM: RIGHT FOOT COMPLETE - 3+ VIEW   COMPARISON:  MRI dated 03/06/2011  FINDINGS: The distal phalanx of the great toe has been amputated. There is severe arthritis of the first metatarsal phalangeal joint. There are no findings of osteomyelitis. There is a Haglund deformity at the insertion of the Achilles tendon on the posterior calcaneus. Small plantar calcaneal spur. Slight dorsal spurring in the midfoot.  IMPRESSION: No radiographic evidence of osteomyelitis.   Electronically Signed   By: Lorriane Shire M.D.   On: 01/11/2015 16:46    CBC  Recent Labs Lab 01/09/15 1332 01/11/15 1402 01/12/15 0335 01/13/15 0353 01/14/15 0729  WBC 19.0* 20.6* 13.8* 12.4* 13.2*  HGB 12.1* 12.3* 10.9* 10.0* 10.0*  HCT 35.7* 35.4* 32.3* 30.5* 30.3*  PLT 292 295 284 317 364  MCV 87.3 85.5 84.8 87.1 87.6  MCH 29.6 29.7 28.6 28.6 28.9  MCHC 33.9 34.7 33.7 32.8 33.0  RDW 13.4 13.6 13.7 13.9 14.0    Chemistries   Recent Labs Lab 01/09/15 1332 01/11/15 1402 01/12/15 0335 01/13/15 0353 01/14/15 0450  NA 134* 133* 134* 136 137  K 4.1 3.5 3.0* 3.7 3.7  CL 93* 93* 100 99 102  CO2 24 24 26 29 28   GLUCOSE 263* 241* 128* 134* 138*  BUN 29* 39* 37* 23 17  CREATININE 1.54* 1.74* 1.38* 1.28 1.23  CALCIUM 8.9 8.5 7.8* 7.7* 7.7*   ------------------------------------------------------------------------------------------------------------------ estimated creatinine clearance is 64.1 mL/min (by C-G formula based on Cr of 1.23). ------------------------------------------------------------------------------------------------------------------  Recent Labs  01/11/15 2036  HGBA1C 8.2*   ------------------------------------------------------------------------------------------------------------------ No results for input(s): CHOL, HDL, LDLCALC, TRIG, CHOLHDL, LDLDIRECT in the last 72 hours. ------------------------------------------------------------------------------------------------------------------  Recent Labs  01/11/15 2036  TSH  2.469   ------------------------------------------------------------------------------------------------------------------ No results for input(s): VITAMINB12, FOLATE, FERRITIN, TIBC, IRON, RETICCTPCT in the last 72 hours.  Coagulation profile  Recent Labs Lab 01/09/15 1332 01/12/15 0010  INR 1.27 1.26    No results for input(s): DDIMER in the last 72 hours.  Cardiac Enzymes No results for input(s): CKMB, TROPONINI, MYOGLOBIN in the last 168 hours.  Invalid input(s): CK ------------------------------------------------------------------------------------------------------------------ Invalid input(s): POCBNP    Arin Peral D.O. on 01/14/2015 at 10:54 AM  Between 7am to 7pm - Pager - [phone]  After 7pm go to [email or web address] - password [email or web address]  And look for the night coverage person covering for me after hours  [facility] Office  [phone]

## 2015-01-15 ENCOUNTER — Encounter (HOSPITAL_COMMUNITY): Payer: Self-pay | Admitting: Orthopaedic Surgery

## 2015-01-15 ENCOUNTER — Encounter: Payer: Self-pay | Admitting: *Deleted

## 2015-01-15 LAB — BASIC METABOLIC PANEL
Anion gap: 6 (ref 5–15)
BUN: 16 mg/dL (ref 6–23)
CALCIUM: 7.9 mg/dL — AB (ref 8.4–10.5)
CO2: 26 mmol/L (ref 19–32)
CREATININE: 1.14 mg/dL (ref 0.50–1.35)
Chloride: 105 mmol/L (ref 96–112)
GFR calc Af Amer: 74 mL/min — ABNORMAL LOW (ref >90)
GFR, EST NON AFRICAN AMERICAN: 64 mL/min — AB (ref >90)
GLUCOSE: 133 mg/dL — AB (ref 70–99)
POTASSIUM: 4.4 mmol/L (ref 3.5–5.1)
Sodium: 137 mmol/L (ref 135–145)

## 2015-01-15 LAB — CBC
HEMATOCRIT: 31 % — AB (ref 39.0–52.0)
Hemoglobin: 10.1 g/dL — ABNORMAL LOW (ref 13.0–17.0)
MCH: 28.9 pg (ref 26.0–34.0)
MCHC: 32.6 g/dL (ref 30.0–36.0)
MCV: 88.6 fL (ref 78.0–100.0)
PLATELETS: 389 10*3/uL (ref 150–400)
RBC: 3.5 MIL/uL — AB (ref 4.22–5.81)
RDW: 14.3 % (ref 11.5–15.5)
WBC: 14.5 10*3/uL — AB (ref 4.0–10.5)

## 2015-01-15 LAB — GLUCOSE, CAPILLARY
GLUCOSE-CAPILLARY: 173 mg/dL — AB (ref 70–99)
GLUCOSE-CAPILLARY: 147 mg/dL — AB (ref 70–99)
Glucose-Capillary: 136 mg/dL — ABNORMAL HIGH (ref 70–99)
Glucose-Capillary: 245 mg/dL — ABNORMAL HIGH (ref 70–99)

## 2015-01-15 MED ORDER — RIVAROXABAN 20 MG PO TABS: 20.0000 mg | ORAL_TABLET | Freq: Every day | ORAL | Status: DC

## 2015-01-15 MED ORDER — HYDROCODONE-ACETAMINOPHEN 5-325 MG PO TABS: 1.0000 | ORAL_TABLET | ORAL | Status: DC | PRN

## 2015-01-15 MED ORDER — INSULIN ASPART 100 UNIT/ML ~~LOC~~ SOLN: 0.0000 [IU] | Freq: Three times a day (TID) | SUBCUTANEOUS | Status: DC

## 2015-01-15 MED ORDER — ACETAMINOPHEN 325 MG PO TABS: 650.0000 mg | ORAL_TABLET | Freq: Four times a day (QID) | ORAL | Status: DC | PRN

## 2015-01-15 MED ORDER — BISACODYL 10 MG RE SUPP
10.0000 mg | Freq: Once | RECTAL | Status: AC
  Administered 2015-01-15: 10 mg via RECTAL
  Filled 2015-01-15: qty 1

## 2015-01-15 MED ORDER — MUPIROCIN 2 % EX OINT: 1.0000 "application " | TOPICAL_OINTMENT | Freq: Two times a day (BID) | CUTANEOUS | Status: DC

## 2015-01-15 MED ORDER — AMOXICILLIN-POT CLAVULANATE 875-125 MG PO TABS: 1.0000 | ORAL_TABLET | Freq: Two times a day (BID) | ORAL | Status: DC

## 2015-01-15 NOTE — Progress Notes (Signed)
01/15/2015 1:41 PM Pt. To D/C to [facility] in [place].  D/C IV and TELE.  Right foot wound vac dressing changed to wet to dry.  Report called to facility.  Pt. D/c via [facility]. [place]

## 2015-01-15 NOTE — Discharge Summary (Addendum)
Physician Discharge Summary  [name] [name] [id number] DOB: 03-15-1946 DOA: 01/11/2015  PCP: [name] Blitz, MD  Admit date: 01/11/2015 Discharge date: 01/15/2015  Time spent: 45 minutes  Recommendations for Outpatient Follow-up:  Patient will be discharged skilled nursing facility. Patient to continue physical as was occupational therapy as recommended by the facility. Patient to follow-up with the physician at the nursing home or his primary care physician within one week of discharge. Patient will need to follow-up with orthopedics, [name] [name], within 1 week of discharge. Patient will need to have wound VAC changes Monday, Wednesday, Fridays. Patient should continue his medications as prescribed. Patient strongly heart healthy/carb modified diet.  Discharge Diagnoses:  Sepsis secondary to osteomyelitis of the left foot/critical limb ischemia Paroxysmal atrial fibrillation with RVR Central hypertension Diabetes mellitus, type II with neuropathy/diabetic foot infection Acute on chronic renal failure question stage III Hyperlipidemia Hypokalemia  Discharge Condition: Stable  Diet recommendation: Heart healthy/Carb modified  Filed Weights   01/12/15 0508 01/13/15 0500 01/15/15 0406  Weight: 100.5 kg (221 lb 9 oz) 100.6 kg (221 lb 12.5 oz) 100.5 kg (221 lb 9 oz)    History of present illness:  01/11/2015 by [name] [name] is a 69 y.o. male with history of type II DM with peripheral neuropathy, essential hypertension, hyperlipidemia, stage I prostate cancer, atrial flutter status post ablation a year ago for which she was briefly on Xarelto, sent to the ED from vascular surgery/[name] [name]'s office for further evaluation of left foot osteomyelitis and cellulitis. Patient states that he lacks sensation in both feet for the last 5 years. Sometime a month ago, he sustained an injury to the left foot which she did not realize until he started noticing foul smell, purulent drainage  admixed with blood. This progressively got worse over the next few weeks. He denies pain. He eventually saw a podiatrist [name] [name] who referred him to see [name] [name] whom he saw on 3/22 and plans were to get arterial Dopplers and arrange for him to undergo angiography and potential intervention for limb salvage today. He was then referred to see [name] [name] home he did today and was sent to the ED for evaluation. Patient requested a second opinion and EDP has consulted orthopedics/[name] [name]. Per EDP, patient was transiently in A. fib with RVR and spontaneously reverted to sinus rhythm. Patient denies chest pain, dyspnea, palpitations, dizziness. He states that he had a fever of 102F 2 days ago. He denies any other complaints. In the ED, blood glucose was 241, creatinine 1.74 WBC 20.6, hemoglobin 12.3, x-ray of left foot shows osteomyelitis of the proximal and distal phalanges of the great toe and proximal and middle phalanges of the second toe and gas gangrene of the forefoot. X-rays of the right foot show no radiographic evidence of osteomyelitis. Hospitalist admission requested.  Hospital Course:  Sepsis secondary to Osteomyelitis of the left foot/critical limb ischemia -Patient had leukocytosis with tachycardia upon admission, leukocytosis trending downward, tachycardia resolved -Vascular surgery consulted, left foot is not salvageable, vascular surgery did recommend repair however patient wanted second opinion -Likely complicated by diabetes mellitus -Orthopedic surgery consulted and appreciated, s/p Left BKA -Initially on IV vancomycin and Zosyn, continue PO Augmentin for 1 week -PT consulted and recommend SNF -Patient will need to follow up with [name] [name], orthopedics in 1 week. -Bilateral lower extremities NWB per ortho rec  Paroxysmal atrial fibrillation with RVR -Status post ablation in 2015 by [name] [name] in [place] -Was on [place] at that  time -TSH 2.469 -Echocardiogram: EF  55-60% -ChadsVASC 4 -Initially placed on heparin due to surgery; transitioned to xarelto 3/26 -Continue metoprolol (currently rate controlled)  Essential hypertension  -Continue metoprolol -ARB and diuretic held due to acute on chronic kidney disease  Diabetes mellitus, type II, with neuropathy/diabetic foot infection -Treatment plan as above for infection -Hemoglobin A1c 8.2 -Metformin initially however patient may continue upon discharge -Initially placed on Lantus with CBG monitoring and sliding scale insulin -Will also discharge patient with sliding scale -Wound care for right foot infection -Wound vac in place for right foot, will be exchanged on Monday -Wound VAC should be replaced Monday, Wednesday, Friday as per orthopedics -Patient should discuss better diabetes control and management with his primary care physician  Acute on chronic renal failure, ?Stage 3 -Resolved, Unknown baseline creatinine, last known creatinine of 3/22 1.5 -Currently Cr 1.14  Hyperlipidemia -Continue statin  Hypokalemia  -Resolved  Procedures  LBKA Wound vac on right foot  Consults  Orthopedic Surgery   Discharge Exam: Filed Vitals:   01/15/15 1016  BP: 114/67  Pulse: 64  Temp:   Resp: 17   Exam  General: Well developed, well nourished, NAD  Cardiovascular: S1 S2 auscultated, irregular  Respiratory: Clear to auscultation  Abdomen: Soft, obese, nontender, nondistended, + bowel sounds  Extremities: LBKA, wound vac on right foot  Neuro: AAOx3, nonfocal  Discharge Instructions      Discharge Instructions    Discharge instructions    Complete by:  As directed   Patient will be discharged skilled nursing facility. Patient to continue physical as was occupational therapy as recommended by the facility. Patient to follow-up with the physician at the nursing home or his primary care physician within one week of discharge. Patient will need to follow-up with orthopedics, Dr.  Erlinda [name], within 1 week of discharge. Patient will need to have wound VAC changes Monday, Wednesday, Fridays. Patient should continue his medications as prescribed. Patient strongly heart healthy/carb modified diet.            Medication List    TAKE these medications        acetaminophen 325 MG tablet  Commonly known as:  TYLENOL  Take 2 tablets (650 mg total) by mouth every 6 (six) hours as needed for mild pain (or Fever >/= 101).     alprazolam 2 MG tablet  Commonly known as:  XANAX  Take 2 mg by mouth at bedtime.     amoxicillin-clavulanate 875-125 MG per tablet  Commonly known as:  AUGMENTIN  Take 1 tablet by mouth 2 (two) times daily.     aspirin EC 81 MG tablet  Take 81 mg by mouth daily.     cephALEXin 500 MG capsule  Commonly known as:  KEFLEX  1,000 mg 2 (two) times daily.     chlorthalidone 25 MG tablet  Commonly known as:  HYGROTON  Take 25 mg by mouth daily.     citalopram 40 MG tablet  Commonly known as:  CELEXA  Take 40 mg by mouth daily.     Fish Oil 1000 MG Caps  Take 1 capsule by mouth daily.     folic acid A999333 MCG tablet  Commonly known as:  FOLVITE  Take 400 mcg by mouth daily.     gabapentin 100 MG capsule  Commonly known as:  NEURONTIN  Take 100 mg by mouth 3 (three) times daily.     HYDROcodone-acetaminophen 5-325 MG per tablet  Commonly known as:  NORCO/VICODIN  Take 1 tablet by mouth every 4 (four) hours as needed for moderate pain.     insulin aspart 100 UNIT/ML injection  Commonly known as:  novoLOG  - Inject 0-9 Units into the skin 3 (three) times daily with meals. Sliding scale   - CBG 70 - 120: 0 units   - CBG 121 - 150: 1 unit,    - CBG 151 - 200: 2 units,    - CBG 201 - 250: 3 units,    - CBG 251 - 300: 5 units,    - CBG 301 - 350: 7 units,    - CBG 351 - 400: 9 units     - CBG > 400: 9 units and notify your MD     losartan 100 MG tablet  Commonly known as:  COZAAR  Take 100 mg by mouth daily.     metFORMIN 500 MG  tablet  Commonly known as:  GLUCOPHAGE  Take 500 mg by mouth 2 (two) times daily.     metoprolol 100 MG tablet  Commonly known as:  LOPRESSOR  Take 100 mg by mouth 2 (two) times daily.     multivitamin capsule  Take 1 capsule by mouth daily.     mupirocin ointment 2 %  Commonly known as:  BACTROBAN  Place 1 application into the nose 2 (two) times daily. Use through 01/18/2015     rivaroxaban 20 MG Tabs tablet  Commonly known as:  XARELTO  Take 1 tablet (20 mg total) by mouth daily with supper.     simvastatin 40 MG tablet  Commonly known as:  ZOCOR  Take 40 mg by mouth daily.     tamsulosin 0.4 MG Caps capsule  Commonly known as:  FLOMAX  Take 0.4 mg by mouth daily.     traZODone 50 MG tablet  Commonly known as:  DESYREL  Take 50 mg by mouth at bedtime.     vitamin E 400 UNIT capsule  Take 400 Units by mouth daily.       No Known Allergies Follow-up Information    Follow up with Marianna Payment, MD In 1 week.   Specialty:  Orthopedic Surgery   Why:  For wound re-check   Contact information:   [place] [address] [phone]       Follow up with [facility], MD. Schedule an appointment as soon as possible for a visit in 1 week.   Specialty:  Internal Medicine   Why:  Hospital follow up   Contact information:   [place] [address] [phone]        The results of significant diagnostics from this hospitalization (including imaging, microbiology, ancillary and laboratory) are listed below for reference.    Significant Diagnostic Studies: Dg Foot Complete Left  01/11/2015   CLINICAL DATA:  Cellulitis and open wound on the great toe.  EXAM: LEFT FOOT - COMPLETE 3+ VIEW  COMPARISON:  None.  FINDINGS: There is destruction of the distal aspect of the proximal phalanx of the great toe in the distal phalanx is completely destroyed.  There is also osseous destruction at the PIP joint of the second toe.  There is severe  arthritis at the first metatarsal phalangeal joint and there is gas in the soft tissues overlying the entire first metatarsal suggesting gas gangrene. The soft tissue gas extends onto the plantar aspect of the foot underlying the second and third metatarsals.  There is a prominent  Haglund deformity at the Achilles insertion on the calcaneus. There is dorsal spurring at the talonavicular joint.  Prominent soft tissue swelling of forefoot.  IMPRESSION: Osteomyelitis of the proximal and distal phalanges of the great toe. Osteomyelitis of the proximal and middle phalanges of the second toe.  Gas gangrene of the forefoot.   Electronically Signed   By: Lorriane Shire M.D.   On: 01/11/2015 16:48   Dg Foot Complete Right  01/11/2015   CLINICAL DATA:  Cellulitis with open wounds on the first through third toes of the right foot.  EXAM: RIGHT FOOT COMPLETE - 3+ VIEW  COMPARISON:  MRI dated 03/06/2011  FINDINGS: The distal phalanx of the great toe has been amputated. There is severe arthritis of the first metatarsal phalangeal joint. There are no findings of osteomyelitis. There is a Haglund deformity at the insertion of the Achilles tendon on the posterior calcaneus. Small plantar calcaneal spur. Slight dorsal spurring in the midfoot.  IMPRESSION: No radiographic evidence of osteomyelitis.   Electronically Signed   By: Lorriane Shire M.D.   On: 01/11/2015 16:46    Microbiology: Recent Results (from the past 240 hour(s))  Blood Culture (routine x 2)     Status: None (Preliminary result)   Collection Time: 01/11/15  2:02 PM  Result Value Ref Range Status   Specimen Description BLOOD RIGHT ARM  Final   Special Requests BOTTLES DRAWN AEROBIC ONLY 4CCS  Final   Culture   Final           BLOOD CULTURE RECEIVED NO GROWTH TO DATE CULTURE WILL BE HELD FOR 5 DAYS BEFORE ISSUING A FINAL NEGATIVE REPORT Performed at [facility]    Report Status PENDING  Incomplete  Blood Culture (routine x 2)     Status: None  (Preliminary result)   Collection Time: 01/11/15  4:15 PM  Result Value Ref Range Status   Specimen Description BLOOD RIGHT WRIST  Final   Special Requests BOTTLES DRAWN AEROBIC AND ANAEROBIC 5CCS  Final   Culture   Final           BLOOD CULTURE RECEIVED NO GROWTH TO DATE CULTURE WILL BE HELD FOR 5 DAYS BEFORE ISSUING A FINAL NEGATIVE REPORT Performed at [facility]    Report Status PENDING  Incomplete  Surgical pcr screen     Status: Abnormal   Collection Time: 01/11/15 11:10 PM  Result Value Ref Range Status   MRSA, PCR NEGATIVE NEGATIVE Final   Staphylococcus aureus POSITIVE (A) NEGATIVE Final    Comment:        The Xpert SA Assay (FDA approved for NASAL specimens in patients over 31 years of age), is one component of a comprehensive surveillance program.  Test performance has been validated by [facility] for patients greater than or equal to 60 year old. It is not intended to diagnose infection nor to guide or monitor treatment.      Labs: Basic Metabolic Panel:  Recent Labs Lab 01/11/15 1402 01/12/15 0335 01/13/15 0353 01/14/15 0450 01/15/15 0609  NA 133* 134* 136 137 137  K 3.5 3.0* 3.7 3.7 4.4  CL 93* 100 99 102 105  CO2 24 26 29 28 26   GLUCOSE 241* 128* 134* 138* 133*  BUN 39* 37* 23 17 16   CREATININE 1.74* 1.38* 1.28 1.23 1.14  CALCIUM 8.5 7.8* 7.7* 7.7* 7.9*   Liver Function Tests: No results for input(s): AST, ALT, ALKPHOS, BILITOT, PROT, ALBUMIN in the last 168 hours. No  results for input(s): LIPASE, AMYLASE in the last 168 hours. No results for input(s): AMMONIA in the last 168 hours. CBC:  Recent Labs Lab 01/11/15 1402 01/12/15 0335 01/13/15 0353 01/14/15 0729 01/15/15 0609  WBC 20.6* 13.8* 12.4* 13.2* 14.5*  HGB 12.3* 10.9* 10.0* 10.0* 10.1*  HCT 35.4* 32.3* 30.5* 30.3* 31.0*  MCV 85.5 84.8 87.1 87.6 88.6  PLT 295 284 317 364 389   Cardiac Enzymes: No results for input(s): CKTOTAL, CKMB, CKMBINDEX, TROPONINI in the last 168  hours. BNP: BNP (last 3 results) No results for input(s): BNP in the last 8760 hours.  ProBNP (last 3 results) No results for input(s): PROBNP in the last 8760 hours.  CBG:  Recent Labs Lab 01/14/15 0637 01/14/15 1118 01/14/15 1622 01/14/15 2057 01/15/15 0647  GLUCAP 151* 203* 173* 147* 136*       Signed:  Cristal Ford  [facility] 01/15/2015, 11:05 AM

## 2015-01-15 NOTE — Care Management Note (Signed)
    Page 1 of 1   01/15/2015     11:51:20 AM CARE MANAGEMENT NOTE 01/15/2015  Patient:  [name] [name]   Account Number:  [id number]  Date Initiated:  01/15/2015  Documentation initiated by:  Marvetta Gibbons  Subjective/Objective Assessment:   Pt admitted with osteomyelitis in foot s/p BKA with wound VAC placed     Action/Plan:   PTA pt lived at home alone- will need SNF at discharge- CSW following for placement needs   Anticipated DC Date:  01/15/2015   Anticipated DC Plan:  SKILLED NURSING FACILITY  In-house referral  Clinical Social Worker      DC Planning Services  CM consult      Choice offered to / List presented to:             Status of service:  Completed, signed off Medicare Important Message given?  YES (If response is "NO", the following Medicare IM given date fields will be blank) Date Medicare IM given:  01/15/2015 Medicare IM given by:  Marvetta Gibbons Date Additional Medicare IM given:   Additional Medicare IM given by:    Discharge Disposition:  [name]  Per UR Regulation:  Reviewed for med. necessity/level of care/duration of stay  If discussed at [place] of Stay Meetings, dates discussed:    Comments:

## 2015-01-15 NOTE — Anesthesia Postprocedure Evaluation (Signed)
  Anesthesia Post-op Note  Patient: [name] [name]  Procedure(s) Performed: Procedure(s): AMPUTATION BELOW KNEE (Left) IRRIGATION AND DEBRIDEMENT FOOT (Right) APPLICATION OF WOUND VAC (Right)  Patient Location: PACU  Anesthesia Type:General  Level of Consciousness: awake, alert , oriented and patient cooperative  Airway and Oxygen Therapy: Patient Spontanous Breathing  Post-op Pain: mild, moderate  Post-op Assessment: Post-op Vital signs reviewed, Patient's Cardiovascular Status Stable, Respiratory Function Stable, Patent Airway, No signs of Nausea or vomiting and Pain level controlled  Post-op Vital Signs: stable  Last Vitals:  Filed Vitals:   01/15/15 0406  BP: 145/71  Pulse: 68  Temp: 36.4 C  Resp: 18    Complications: No apparent anesthesia complications

## 2015-01-15 NOTE — Clinical Social Work Note (Signed)
Patient will discharge to [place] Anticipated discharge date: 01/15/15 Family notified: pt brother, Wille Glaser, aware Transportation by [employer]- scheduled for 1:30pm  CSW signing off.  Domenica Reamer, [place] Social Worker [phone]

## 2015-01-15 NOTE — Progress Notes (Signed)
   Subjective:  No events.  Objective:   VITALS:   Filed Vitals:   01/14/15 1353 01/14/15 1949 01/15/15 0138 01/15/15 0406  BP: 104/75 119/61 124/68 145/71  Pulse: 60 71 57 68  Temp: 97.5 F (36.4 C) 97.4 F (36.3 C)  97.6 F (36.4 C)  TempSrc: Oral Oral  Oral  Resp: 19 18  18   Height:      Weight:    100.5 kg (221 lb 9 oz)  SpO2: 97% 99%  92%    LLE dressing c/d/i RLE VAC with blockage   Lab Results  Component Value Date   WBC 13.2* 01/14/2015   HGB 10.0* 01/14/2015   HCT 30.3* 01/14/2015   MCV 87.6 01/14/2015   PLT 364 01/14/2015     Assessment/Plan:  3 Days Post-Op   - MWF VAC changes by floor RN - home with home vac - need SW assistance - BLE NWB - up with PT - will need SNF - f/u in 1 week in office  Marianna Payment 01/15/2015, 7:42 AM [phone]

## 2015-01-15 NOTE — Progress Notes (Signed)
Medicare Important Message given? YES  (If response is "NO", the following Medicare IM given date fields will be blank)  Date Medicare IM given: 01/15/15 Medicare IM given by:  Feliciano Wynter  

## 2015-01-18 LAB — CULTURE, BLOOD (ROUTINE X 2)
CULTURE: NO GROWTH
Culture: NO GROWTH

## 2015-10-17 DIAGNOSIS — E785 Hyperlipidemia, unspecified: Secondary | ICD-10-CM | POA: Diagnosis not present

## 2015-10-17 DIAGNOSIS — N189 Chronic kidney disease, unspecified: Secondary | ICD-10-CM | POA: Diagnosis not present

## 2015-10-17 DIAGNOSIS — F419 Anxiety disorder, unspecified: Secondary | ICD-10-CM | POA: Diagnosis not present

## 2015-10-17 DIAGNOSIS — I129 Hypertensive chronic kidney disease with stage 1 through stage 4 chronic kidney disease, or unspecified chronic kidney disease: Secondary | ICD-10-CM | POA: Diagnosis not present

## 2015-10-17 DIAGNOSIS — I4891 Unspecified atrial fibrillation: Secondary | ICD-10-CM | POA: Diagnosis not present

## 2015-10-17 DIAGNOSIS — K611 Rectal abscess: Secondary | ICD-10-CM | POA: Diagnosis not present

## 2015-10-17 DIAGNOSIS — E1122 Type 2 diabetes mellitus with diabetic chronic kidney disease: Secondary | ICD-10-CM | POA: Diagnosis not present

## 2015-10-17 DIAGNOSIS — F329 Major depressive disorder, single episode, unspecified: Secondary | ICD-10-CM | POA: Diagnosis not present

## 2015-10-17 DIAGNOSIS — Z89512 Acquired absence of left leg below knee: Secondary | ICD-10-CM | POA: Diagnosis not present

## 2015-10-19 DIAGNOSIS — E785 Hyperlipidemia, unspecified: Secondary | ICD-10-CM | POA: Diagnosis not present

## 2015-10-19 DIAGNOSIS — I4891 Unspecified atrial fibrillation: Secondary | ICD-10-CM | POA: Diagnosis not present

## 2015-10-19 DIAGNOSIS — N189 Chronic kidney disease, unspecified: Secondary | ICD-10-CM | POA: Diagnosis not present

## 2015-10-19 DIAGNOSIS — E1122 Type 2 diabetes mellitus with diabetic chronic kidney disease: Secondary | ICD-10-CM | POA: Diagnosis not present

## 2015-10-19 DIAGNOSIS — I129 Hypertensive chronic kidney disease with stage 1 through stage 4 chronic kidney disease, or unspecified chronic kidney disease: Secondary | ICD-10-CM | POA: Diagnosis not present

## 2015-10-19 DIAGNOSIS — K611 Rectal abscess: Secondary | ICD-10-CM | POA: Diagnosis not present

## 2015-10-22 DIAGNOSIS — I129 Hypertensive chronic kidney disease with stage 1 through stage 4 chronic kidney disease, or unspecified chronic kidney disease: Secondary | ICD-10-CM | POA: Diagnosis not present

## 2015-10-22 DIAGNOSIS — K611 Rectal abscess: Secondary | ICD-10-CM | POA: Diagnosis not present

## 2015-10-22 DIAGNOSIS — N189 Chronic kidney disease, unspecified: Secondary | ICD-10-CM | POA: Diagnosis not present

## 2015-10-22 DIAGNOSIS — E1122 Type 2 diabetes mellitus with diabetic chronic kidney disease: Secondary | ICD-10-CM | POA: Diagnosis not present

## 2015-10-22 DIAGNOSIS — E785 Hyperlipidemia, unspecified: Secondary | ICD-10-CM | POA: Diagnosis not present

## 2015-10-22 DIAGNOSIS — I4891 Unspecified atrial fibrillation: Secondary | ICD-10-CM | POA: Diagnosis not present

## 2015-10-24 DIAGNOSIS — N189 Chronic kidney disease, unspecified: Secondary | ICD-10-CM | POA: Diagnosis not present

## 2015-10-24 DIAGNOSIS — I4891 Unspecified atrial fibrillation: Secondary | ICD-10-CM | POA: Diagnosis not present

## 2015-10-24 DIAGNOSIS — I129 Hypertensive chronic kidney disease with stage 1 through stage 4 chronic kidney disease, or unspecified chronic kidney disease: Secondary | ICD-10-CM | POA: Diagnosis not present

## 2015-10-24 DIAGNOSIS — E785 Hyperlipidemia, unspecified: Secondary | ICD-10-CM | POA: Diagnosis not present

## 2015-10-24 DIAGNOSIS — E1122 Type 2 diabetes mellitus with diabetic chronic kidney disease: Secondary | ICD-10-CM | POA: Diagnosis not present

## 2015-10-24 DIAGNOSIS — K611 Rectal abscess: Secondary | ICD-10-CM | POA: Diagnosis not present

## 2015-10-26 DIAGNOSIS — I129 Hypertensive chronic kidney disease with stage 1 through stage 4 chronic kidney disease, or unspecified chronic kidney disease: Secondary | ICD-10-CM | POA: Diagnosis not present

## 2015-10-26 DIAGNOSIS — E785 Hyperlipidemia, unspecified: Secondary | ICD-10-CM | POA: Diagnosis not present

## 2015-10-26 DIAGNOSIS — N189 Chronic kidney disease, unspecified: Secondary | ICD-10-CM | POA: Diagnosis not present

## 2015-10-26 DIAGNOSIS — I4891 Unspecified atrial fibrillation: Secondary | ICD-10-CM | POA: Diagnosis not present

## 2015-10-26 DIAGNOSIS — K611 Rectal abscess: Secondary | ICD-10-CM | POA: Diagnosis not present

## 2015-10-26 DIAGNOSIS — E1122 Type 2 diabetes mellitus with diabetic chronic kidney disease: Secondary | ICD-10-CM | POA: Diagnosis not present

## 2015-10-31 DIAGNOSIS — I129 Hypertensive chronic kidney disease with stage 1 through stage 4 chronic kidney disease, or unspecified chronic kidney disease: Secondary | ICD-10-CM | POA: Diagnosis not present

## 2015-10-31 DIAGNOSIS — E785 Hyperlipidemia, unspecified: Secondary | ICD-10-CM | POA: Diagnosis not present

## 2015-10-31 DIAGNOSIS — K611 Rectal abscess: Secondary | ICD-10-CM | POA: Diagnosis not present

## 2015-10-31 DIAGNOSIS — N189 Chronic kidney disease, unspecified: Secondary | ICD-10-CM | POA: Diagnosis not present

## 2015-10-31 DIAGNOSIS — I4891 Unspecified atrial fibrillation: Secondary | ICD-10-CM | POA: Diagnosis not present

## 2015-10-31 DIAGNOSIS — E1122 Type 2 diabetes mellitus with diabetic chronic kidney disease: Secondary | ICD-10-CM | POA: Diagnosis not present

## 2015-11-01 DIAGNOSIS — E1142 Type 2 diabetes mellitus with diabetic polyneuropathy: Secondary | ICD-10-CM | POA: Diagnosis not present

## 2015-11-01 DIAGNOSIS — Z6831 Body mass index (BMI) 31.0-31.9, adult: Secondary | ICD-10-CM | POA: Diagnosis not present

## 2015-11-01 DIAGNOSIS — Z789 Other specified health status: Secondary | ICD-10-CM | POA: Diagnosis not present

## 2015-11-02 DIAGNOSIS — K611 Rectal abscess: Secondary | ICD-10-CM | POA: Diagnosis not present

## 2015-11-02 DIAGNOSIS — I129 Hypertensive chronic kidney disease with stage 1 through stage 4 chronic kidney disease, or unspecified chronic kidney disease: Secondary | ICD-10-CM | POA: Diagnosis not present

## 2015-11-02 DIAGNOSIS — I4891 Unspecified atrial fibrillation: Secondary | ICD-10-CM | POA: Diagnosis not present

## 2015-11-02 DIAGNOSIS — N189 Chronic kidney disease, unspecified: Secondary | ICD-10-CM | POA: Diagnosis not present

## 2015-11-02 DIAGNOSIS — E1122 Type 2 diabetes mellitus with diabetic chronic kidney disease: Secondary | ICD-10-CM | POA: Diagnosis not present

## 2015-11-02 DIAGNOSIS — E785 Hyperlipidemia, unspecified: Secondary | ICD-10-CM | POA: Diagnosis not present

## 2015-11-05 DIAGNOSIS — E1122 Type 2 diabetes mellitus with diabetic chronic kidney disease: Secondary | ICD-10-CM | POA: Diagnosis not present

## 2015-11-05 DIAGNOSIS — I129 Hypertensive chronic kidney disease with stage 1 through stage 4 chronic kidney disease, or unspecified chronic kidney disease: Secondary | ICD-10-CM | POA: Diagnosis not present

## 2015-11-05 DIAGNOSIS — E785 Hyperlipidemia, unspecified: Secondary | ICD-10-CM | POA: Diagnosis not present

## 2015-11-05 DIAGNOSIS — I4891 Unspecified atrial fibrillation: Secondary | ICD-10-CM | POA: Diagnosis not present

## 2015-11-05 DIAGNOSIS — N189 Chronic kidney disease, unspecified: Secondary | ICD-10-CM | POA: Diagnosis not present

## 2015-11-05 DIAGNOSIS — K611 Rectal abscess: Secondary | ICD-10-CM | POA: Diagnosis not present

## 2015-11-07 DIAGNOSIS — K611 Rectal abscess: Secondary | ICD-10-CM | POA: Diagnosis not present

## 2015-11-07 DIAGNOSIS — I129 Hypertensive chronic kidney disease with stage 1 through stage 4 chronic kidney disease, or unspecified chronic kidney disease: Secondary | ICD-10-CM | POA: Diagnosis not present

## 2015-11-07 DIAGNOSIS — I4891 Unspecified atrial fibrillation: Secondary | ICD-10-CM | POA: Diagnosis not present

## 2015-11-07 DIAGNOSIS — N189 Chronic kidney disease, unspecified: Secondary | ICD-10-CM | POA: Diagnosis not present

## 2015-11-07 DIAGNOSIS — E1122 Type 2 diabetes mellitus with diabetic chronic kidney disease: Secondary | ICD-10-CM | POA: Diagnosis not present

## 2015-11-07 DIAGNOSIS — E785 Hyperlipidemia, unspecified: Secondary | ICD-10-CM | POA: Diagnosis not present

## 2015-11-09 DIAGNOSIS — E785 Hyperlipidemia, unspecified: Secondary | ICD-10-CM | POA: Diagnosis not present

## 2015-11-09 DIAGNOSIS — N189 Chronic kidney disease, unspecified: Secondary | ICD-10-CM | POA: Diagnosis not present

## 2015-11-09 DIAGNOSIS — I4891 Unspecified atrial fibrillation: Secondary | ICD-10-CM | POA: Diagnosis not present

## 2015-11-09 DIAGNOSIS — E1122 Type 2 diabetes mellitus with diabetic chronic kidney disease: Secondary | ICD-10-CM | POA: Diagnosis not present

## 2015-11-09 DIAGNOSIS — K611 Rectal abscess: Secondary | ICD-10-CM | POA: Diagnosis not present

## 2015-11-09 DIAGNOSIS — I129 Hypertensive chronic kidney disease with stage 1 through stage 4 chronic kidney disease, or unspecified chronic kidney disease: Secondary | ICD-10-CM | POA: Diagnosis not present

## 2015-11-12 DIAGNOSIS — E1122 Type 2 diabetes mellitus with diabetic chronic kidney disease: Secondary | ICD-10-CM | POA: Diagnosis not present

## 2015-11-12 DIAGNOSIS — N189 Chronic kidney disease, unspecified: Secondary | ICD-10-CM | POA: Diagnosis not present

## 2015-11-12 DIAGNOSIS — M86672 Other chronic osteomyelitis, left ankle and foot: Secondary | ICD-10-CM | POA: Diagnosis not present

## 2015-11-12 DIAGNOSIS — K611 Rectal abscess: Secondary | ICD-10-CM | POA: Diagnosis not present

## 2015-11-12 DIAGNOSIS — I4891 Unspecified atrial fibrillation: Secondary | ICD-10-CM | POA: Diagnosis not present

## 2015-11-12 DIAGNOSIS — L97511 Non-pressure chronic ulcer of other part of right foot limited to breakdown of skin: Secondary | ICD-10-CM | POA: Diagnosis not present

## 2015-11-12 DIAGNOSIS — E785 Hyperlipidemia, unspecified: Secondary | ICD-10-CM | POA: Diagnosis not present

## 2015-11-12 DIAGNOSIS — I129 Hypertensive chronic kidney disease with stage 1 through stage 4 chronic kidney disease, or unspecified chronic kidney disease: Secondary | ICD-10-CM | POA: Diagnosis not present

## 2015-11-14 DIAGNOSIS — I4891 Unspecified atrial fibrillation: Secondary | ICD-10-CM | POA: Diagnosis not present

## 2015-11-14 DIAGNOSIS — I129 Hypertensive chronic kidney disease with stage 1 through stage 4 chronic kidney disease, or unspecified chronic kidney disease: Secondary | ICD-10-CM | POA: Diagnosis not present

## 2015-11-14 DIAGNOSIS — E1122 Type 2 diabetes mellitus with diabetic chronic kidney disease: Secondary | ICD-10-CM | POA: Diagnosis not present

## 2015-11-14 DIAGNOSIS — N189 Chronic kidney disease, unspecified: Secondary | ICD-10-CM | POA: Diagnosis not present

## 2015-11-14 DIAGNOSIS — K611 Rectal abscess: Secondary | ICD-10-CM | POA: Diagnosis not present

## 2015-11-14 DIAGNOSIS — E785 Hyperlipidemia, unspecified: Secondary | ICD-10-CM | POA: Diagnosis not present

## 2015-11-16 DIAGNOSIS — E785 Hyperlipidemia, unspecified: Secondary | ICD-10-CM | POA: Diagnosis not present

## 2015-11-16 DIAGNOSIS — K611 Rectal abscess: Secondary | ICD-10-CM | POA: Diagnosis not present

## 2015-11-16 DIAGNOSIS — I129 Hypertensive chronic kidney disease with stage 1 through stage 4 chronic kidney disease, or unspecified chronic kidney disease: Secondary | ICD-10-CM | POA: Diagnosis not present

## 2015-11-16 DIAGNOSIS — E1122 Type 2 diabetes mellitus with diabetic chronic kidney disease: Secondary | ICD-10-CM | POA: Diagnosis not present

## 2015-11-16 DIAGNOSIS — N189 Chronic kidney disease, unspecified: Secondary | ICD-10-CM | POA: Diagnosis not present

## 2015-11-16 DIAGNOSIS — I4891 Unspecified atrial fibrillation: Secondary | ICD-10-CM | POA: Diagnosis not present

## 2015-11-19 DIAGNOSIS — K611 Rectal abscess: Secondary | ICD-10-CM | POA: Diagnosis not present

## 2015-11-19 DIAGNOSIS — I4891 Unspecified atrial fibrillation: Secondary | ICD-10-CM | POA: Diagnosis not present

## 2015-11-19 DIAGNOSIS — N189 Chronic kidney disease, unspecified: Secondary | ICD-10-CM | POA: Diagnosis not present

## 2015-11-19 DIAGNOSIS — E785 Hyperlipidemia, unspecified: Secondary | ICD-10-CM | POA: Diagnosis not present

## 2015-11-19 DIAGNOSIS — E1122 Type 2 diabetes mellitus with diabetic chronic kidney disease: Secondary | ICD-10-CM | POA: Diagnosis not present

## 2015-11-19 DIAGNOSIS — I129 Hypertensive chronic kidney disease with stage 1 through stage 4 chronic kidney disease, or unspecified chronic kidney disease: Secondary | ICD-10-CM | POA: Diagnosis not present

## 2015-11-21 DIAGNOSIS — E785 Hyperlipidemia, unspecified: Secondary | ICD-10-CM | POA: Diagnosis not present

## 2015-11-21 DIAGNOSIS — I4891 Unspecified atrial fibrillation: Secondary | ICD-10-CM | POA: Diagnosis not present

## 2015-11-21 DIAGNOSIS — N189 Chronic kidney disease, unspecified: Secondary | ICD-10-CM | POA: Diagnosis not present

## 2015-11-21 DIAGNOSIS — I129 Hypertensive chronic kidney disease with stage 1 through stage 4 chronic kidney disease, or unspecified chronic kidney disease: Secondary | ICD-10-CM | POA: Diagnosis not present

## 2015-11-21 DIAGNOSIS — E1122 Type 2 diabetes mellitus with diabetic chronic kidney disease: Secondary | ICD-10-CM | POA: Diagnosis not present

## 2015-11-21 DIAGNOSIS — K611 Rectal abscess: Secondary | ICD-10-CM | POA: Diagnosis not present

## 2015-11-22 DIAGNOSIS — L97519 Non-pressure chronic ulcer of other part of right foot with unspecified severity: Secondary | ICD-10-CM | POA: Diagnosis not present

## 2015-11-22 DIAGNOSIS — S91309A Unspecified open wound, unspecified foot, initial encounter: Secondary | ICD-10-CM | POA: Diagnosis not present

## 2015-11-23 DIAGNOSIS — I129 Hypertensive chronic kidney disease with stage 1 through stage 4 chronic kidney disease, or unspecified chronic kidney disease: Secondary | ICD-10-CM | POA: Diagnosis not present

## 2015-11-23 DIAGNOSIS — E785 Hyperlipidemia, unspecified: Secondary | ICD-10-CM | POA: Diagnosis not present

## 2015-11-23 DIAGNOSIS — E1122 Type 2 diabetes mellitus with diabetic chronic kidney disease: Secondary | ICD-10-CM | POA: Diagnosis not present

## 2015-11-23 DIAGNOSIS — N189 Chronic kidney disease, unspecified: Secondary | ICD-10-CM | POA: Diagnosis not present

## 2015-11-23 DIAGNOSIS — K611 Rectal abscess: Secondary | ICD-10-CM | POA: Diagnosis not present

## 2015-11-23 DIAGNOSIS — I4891 Unspecified atrial fibrillation: Secondary | ICD-10-CM | POA: Diagnosis not present

## 2015-11-26 DIAGNOSIS — E1122 Type 2 diabetes mellitus with diabetic chronic kidney disease: Secondary | ICD-10-CM | POA: Diagnosis not present

## 2015-11-26 DIAGNOSIS — E785 Hyperlipidemia, unspecified: Secondary | ICD-10-CM | POA: Diagnosis not present

## 2015-11-26 DIAGNOSIS — K611 Rectal abscess: Secondary | ICD-10-CM | POA: Diagnosis not present

## 2015-11-26 DIAGNOSIS — I4891 Unspecified atrial fibrillation: Secondary | ICD-10-CM | POA: Diagnosis not present

## 2015-11-26 DIAGNOSIS — N189 Chronic kidney disease, unspecified: Secondary | ICD-10-CM | POA: Diagnosis not present

## 2015-11-26 DIAGNOSIS — I129 Hypertensive chronic kidney disease with stage 1 through stage 4 chronic kidney disease, or unspecified chronic kidney disease: Secondary | ICD-10-CM | POA: Diagnosis not present

## 2015-11-28 DIAGNOSIS — N189 Chronic kidney disease, unspecified: Secondary | ICD-10-CM | POA: Diagnosis not present

## 2015-11-28 DIAGNOSIS — K611 Rectal abscess: Secondary | ICD-10-CM | POA: Diagnosis not present

## 2015-11-28 DIAGNOSIS — E785 Hyperlipidemia, unspecified: Secondary | ICD-10-CM | POA: Diagnosis not present

## 2015-11-28 DIAGNOSIS — I129 Hypertensive chronic kidney disease with stage 1 through stage 4 chronic kidney disease, or unspecified chronic kidney disease: Secondary | ICD-10-CM | POA: Diagnosis not present

## 2015-11-28 DIAGNOSIS — E1122 Type 2 diabetes mellitus with diabetic chronic kidney disease: Secondary | ICD-10-CM | POA: Diagnosis not present

## 2015-11-28 DIAGNOSIS — I4891 Unspecified atrial fibrillation: Secondary | ICD-10-CM | POA: Diagnosis not present

## 2015-11-30 DIAGNOSIS — I4891 Unspecified atrial fibrillation: Secondary | ICD-10-CM | POA: Diagnosis not present

## 2015-11-30 DIAGNOSIS — E1122 Type 2 diabetes mellitus with diabetic chronic kidney disease: Secondary | ICD-10-CM | POA: Diagnosis not present

## 2015-11-30 DIAGNOSIS — N189 Chronic kidney disease, unspecified: Secondary | ICD-10-CM | POA: Diagnosis not present

## 2015-11-30 DIAGNOSIS — I129 Hypertensive chronic kidney disease with stage 1 through stage 4 chronic kidney disease, or unspecified chronic kidney disease: Secondary | ICD-10-CM | POA: Diagnosis not present

## 2015-11-30 DIAGNOSIS — E785 Hyperlipidemia, unspecified: Secondary | ICD-10-CM | POA: Diagnosis not present

## 2015-11-30 DIAGNOSIS — L97519 Non-pressure chronic ulcer of other part of right foot with unspecified severity: Secondary | ICD-10-CM | POA: Diagnosis not present

## 2015-11-30 DIAGNOSIS — K611 Rectal abscess: Secondary | ICD-10-CM | POA: Diagnosis not present

## 2015-11-30 DIAGNOSIS — M86671 Other chronic osteomyelitis, right ankle and foot: Secondary | ICD-10-CM | POA: Diagnosis not present

## 2015-12-03 DIAGNOSIS — L97511 Non-pressure chronic ulcer of other part of right foot limited to breakdown of skin: Secondary | ICD-10-CM | POA: Diagnosis not present

## 2015-12-07 DIAGNOSIS — I129 Hypertensive chronic kidney disease with stage 1 through stage 4 chronic kidney disease, or unspecified chronic kidney disease: Secondary | ICD-10-CM | POA: Diagnosis not present

## 2015-12-07 DIAGNOSIS — E1122 Type 2 diabetes mellitus with diabetic chronic kidney disease: Secondary | ICD-10-CM | POA: Diagnosis not present

## 2015-12-07 DIAGNOSIS — I4891 Unspecified atrial fibrillation: Secondary | ICD-10-CM | POA: Diagnosis not present

## 2015-12-07 DIAGNOSIS — E785 Hyperlipidemia, unspecified: Secondary | ICD-10-CM | POA: Diagnosis not present

## 2015-12-07 DIAGNOSIS — N189 Chronic kidney disease, unspecified: Secondary | ICD-10-CM | POA: Diagnosis not present

## 2015-12-07 DIAGNOSIS — K611 Rectal abscess: Secondary | ICD-10-CM | POA: Diagnosis not present

## 2015-12-10 DIAGNOSIS — E785 Hyperlipidemia, unspecified: Secondary | ICD-10-CM | POA: Diagnosis not present

## 2015-12-10 DIAGNOSIS — I129 Hypertensive chronic kidney disease with stage 1 through stage 4 chronic kidney disease, or unspecified chronic kidney disease: Secondary | ICD-10-CM | POA: Diagnosis not present

## 2015-12-10 DIAGNOSIS — K611 Rectal abscess: Secondary | ICD-10-CM | POA: Diagnosis not present

## 2015-12-10 DIAGNOSIS — N189 Chronic kidney disease, unspecified: Secondary | ICD-10-CM | POA: Diagnosis not present

## 2015-12-10 DIAGNOSIS — E1122 Type 2 diabetes mellitus with diabetic chronic kidney disease: Secondary | ICD-10-CM | POA: Diagnosis not present

## 2015-12-10 DIAGNOSIS — I4891 Unspecified atrial fibrillation: Secondary | ICD-10-CM | POA: Diagnosis not present

## 2015-12-10 DIAGNOSIS — L97519 Non-pressure chronic ulcer of other part of right foot with unspecified severity: Secondary | ICD-10-CM | POA: Diagnosis not present

## 2015-12-12 DIAGNOSIS — E1122 Type 2 diabetes mellitus with diabetic chronic kidney disease: Secondary | ICD-10-CM | POA: Diagnosis not present

## 2015-12-12 DIAGNOSIS — I129 Hypertensive chronic kidney disease with stage 1 through stage 4 chronic kidney disease, or unspecified chronic kidney disease: Secondary | ICD-10-CM | POA: Diagnosis not present

## 2015-12-12 DIAGNOSIS — K611 Rectal abscess: Secondary | ICD-10-CM | POA: Diagnosis not present

## 2015-12-12 DIAGNOSIS — E785 Hyperlipidemia, unspecified: Secondary | ICD-10-CM | POA: Diagnosis not present

## 2015-12-12 DIAGNOSIS — I4891 Unspecified atrial fibrillation: Secondary | ICD-10-CM | POA: Diagnosis not present

## 2015-12-12 DIAGNOSIS — N189 Chronic kidney disease, unspecified: Secondary | ICD-10-CM | POA: Diagnosis not present

## 2015-12-14 DIAGNOSIS — E785 Hyperlipidemia, unspecified: Secondary | ICD-10-CM | POA: Diagnosis not present

## 2015-12-14 DIAGNOSIS — I4891 Unspecified atrial fibrillation: Secondary | ICD-10-CM | POA: Diagnosis not present

## 2015-12-14 DIAGNOSIS — K611 Rectal abscess: Secondary | ICD-10-CM | POA: Diagnosis not present

## 2015-12-14 DIAGNOSIS — E1122 Type 2 diabetes mellitus with diabetic chronic kidney disease: Secondary | ICD-10-CM | POA: Diagnosis not present

## 2015-12-14 DIAGNOSIS — I129 Hypertensive chronic kidney disease with stage 1 through stage 4 chronic kidney disease, or unspecified chronic kidney disease: Secondary | ICD-10-CM | POA: Diagnosis not present

## 2015-12-14 DIAGNOSIS — N189 Chronic kidney disease, unspecified: Secondary | ICD-10-CM | POA: Diagnosis not present

## 2015-12-16 DIAGNOSIS — I129 Hypertensive chronic kidney disease with stage 1 through stage 4 chronic kidney disease, or unspecified chronic kidney disease: Secondary | ICD-10-CM | POA: Diagnosis not present

## 2015-12-16 DIAGNOSIS — Z89512 Acquired absence of left leg below knee: Secondary | ICD-10-CM | POA: Diagnosis not present

## 2015-12-16 DIAGNOSIS — K611 Rectal abscess: Secondary | ICD-10-CM | POA: Diagnosis not present

## 2015-12-16 DIAGNOSIS — I4891 Unspecified atrial fibrillation: Secondary | ICD-10-CM | POA: Diagnosis not present

## 2015-12-16 DIAGNOSIS — F419 Anxiety disorder, unspecified: Secondary | ICD-10-CM | POA: Diagnosis not present

## 2015-12-16 DIAGNOSIS — E11621 Type 2 diabetes mellitus with foot ulcer: Secondary | ICD-10-CM | POA: Diagnosis not present

## 2015-12-16 DIAGNOSIS — E785 Hyperlipidemia, unspecified: Secondary | ICD-10-CM | POA: Diagnosis not present

## 2015-12-16 DIAGNOSIS — L97511 Non-pressure chronic ulcer of other part of right foot limited to breakdown of skin: Secondary | ICD-10-CM | POA: Diagnosis not present

## 2015-12-16 DIAGNOSIS — F329 Major depressive disorder, single episode, unspecified: Secondary | ICD-10-CM | POA: Diagnosis not present

## 2015-12-16 DIAGNOSIS — E1122 Type 2 diabetes mellitus with diabetic chronic kidney disease: Secondary | ICD-10-CM | POA: Diagnosis not present

## 2015-12-16 DIAGNOSIS — N189 Chronic kidney disease, unspecified: Secondary | ICD-10-CM | POA: Diagnosis not present

## 2015-12-17 DIAGNOSIS — I129 Hypertensive chronic kidney disease with stage 1 through stage 4 chronic kidney disease, or unspecified chronic kidney disease: Secondary | ICD-10-CM | POA: Diagnosis not present

## 2015-12-17 DIAGNOSIS — E1122 Type 2 diabetes mellitus with diabetic chronic kidney disease: Secondary | ICD-10-CM | POA: Diagnosis not present

## 2015-12-17 DIAGNOSIS — K611 Rectal abscess: Secondary | ICD-10-CM | POA: Diagnosis not present

## 2015-12-17 DIAGNOSIS — E11621 Type 2 diabetes mellitus with foot ulcer: Secondary | ICD-10-CM | POA: Diagnosis not present

## 2015-12-17 DIAGNOSIS — L97511 Non-pressure chronic ulcer of other part of right foot limited to breakdown of skin: Secondary | ICD-10-CM | POA: Diagnosis not present

## 2015-12-17 DIAGNOSIS — N189 Chronic kidney disease, unspecified: Secondary | ICD-10-CM | POA: Diagnosis not present

## 2015-12-21 DIAGNOSIS — K611 Rectal abscess: Secondary | ICD-10-CM | POA: Diagnosis not present

## 2015-12-21 DIAGNOSIS — N189 Chronic kidney disease, unspecified: Secondary | ICD-10-CM | POA: Diagnosis not present

## 2015-12-21 DIAGNOSIS — I129 Hypertensive chronic kidney disease with stage 1 through stage 4 chronic kidney disease, or unspecified chronic kidney disease: Secondary | ICD-10-CM | POA: Diagnosis not present

## 2015-12-21 DIAGNOSIS — L97511 Non-pressure chronic ulcer of other part of right foot limited to breakdown of skin: Secondary | ICD-10-CM | POA: Diagnosis not present

## 2015-12-21 DIAGNOSIS — E11621 Type 2 diabetes mellitus with foot ulcer: Secondary | ICD-10-CM | POA: Diagnosis not present

## 2015-12-21 DIAGNOSIS — E1122 Type 2 diabetes mellitus with diabetic chronic kidney disease: Secondary | ICD-10-CM | POA: Diagnosis not present

## 2015-12-25 DIAGNOSIS — N189 Chronic kidney disease, unspecified: Secondary | ICD-10-CM | POA: Diagnosis not present

## 2015-12-25 DIAGNOSIS — E1122 Type 2 diabetes mellitus with diabetic chronic kidney disease: Secondary | ICD-10-CM | POA: Diagnosis not present

## 2015-12-25 DIAGNOSIS — I129 Hypertensive chronic kidney disease with stage 1 through stage 4 chronic kidney disease, or unspecified chronic kidney disease: Secondary | ICD-10-CM | POA: Diagnosis not present

## 2015-12-25 DIAGNOSIS — K611 Rectal abscess: Secondary | ICD-10-CM | POA: Diagnosis not present

## 2015-12-25 DIAGNOSIS — L97511 Non-pressure chronic ulcer of other part of right foot limited to breakdown of skin: Secondary | ICD-10-CM | POA: Diagnosis not present

## 2015-12-25 DIAGNOSIS — E11621 Type 2 diabetes mellitus with foot ulcer: Secondary | ICD-10-CM | POA: Diagnosis not present

## 2015-12-28 DIAGNOSIS — K611 Rectal abscess: Secondary | ICD-10-CM | POA: Diagnosis not present

## 2015-12-28 DIAGNOSIS — E11621 Type 2 diabetes mellitus with foot ulcer: Secondary | ICD-10-CM | POA: Diagnosis not present

## 2015-12-28 DIAGNOSIS — N189 Chronic kidney disease, unspecified: Secondary | ICD-10-CM | POA: Diagnosis not present

## 2015-12-28 DIAGNOSIS — I129 Hypertensive chronic kidney disease with stage 1 through stage 4 chronic kidney disease, or unspecified chronic kidney disease: Secondary | ICD-10-CM | POA: Diagnosis not present

## 2015-12-28 DIAGNOSIS — E1122 Type 2 diabetes mellitus with diabetic chronic kidney disease: Secondary | ICD-10-CM | POA: Diagnosis not present

## 2015-12-28 DIAGNOSIS — L97511 Non-pressure chronic ulcer of other part of right foot limited to breakdown of skin: Secondary | ICD-10-CM | POA: Diagnosis not present

## 2016-01-01 DIAGNOSIS — I129 Hypertensive chronic kidney disease with stage 1 through stage 4 chronic kidney disease, or unspecified chronic kidney disease: Secondary | ICD-10-CM | POA: Diagnosis not present

## 2016-01-01 DIAGNOSIS — E11621 Type 2 diabetes mellitus with foot ulcer: Secondary | ICD-10-CM | POA: Diagnosis not present

## 2016-01-01 DIAGNOSIS — L97511 Non-pressure chronic ulcer of other part of right foot limited to breakdown of skin: Secondary | ICD-10-CM | POA: Diagnosis not present

## 2016-01-01 DIAGNOSIS — K611 Rectal abscess: Secondary | ICD-10-CM | POA: Diagnosis not present

## 2016-01-01 DIAGNOSIS — E1122 Type 2 diabetes mellitus with diabetic chronic kidney disease: Secondary | ICD-10-CM | POA: Diagnosis not present

## 2016-01-01 DIAGNOSIS — N189 Chronic kidney disease, unspecified: Secondary | ICD-10-CM | POA: Diagnosis not present

## 2016-01-03 DIAGNOSIS — L97519 Non-pressure chronic ulcer of other part of right foot with unspecified severity: Secondary | ICD-10-CM | POA: Diagnosis not present

## 2016-01-03 DIAGNOSIS — M2041 Other hammer toe(s) (acquired), right foot: Secondary | ICD-10-CM | POA: Diagnosis not present

## 2016-01-04 DIAGNOSIS — E1122 Type 2 diabetes mellitus with diabetic chronic kidney disease: Secondary | ICD-10-CM | POA: Diagnosis not present

## 2016-01-04 DIAGNOSIS — E11621 Type 2 diabetes mellitus with foot ulcer: Secondary | ICD-10-CM | POA: Diagnosis not present

## 2016-01-04 DIAGNOSIS — I129 Hypertensive chronic kidney disease with stage 1 through stage 4 chronic kidney disease, or unspecified chronic kidney disease: Secondary | ICD-10-CM | POA: Diagnosis not present

## 2016-01-04 DIAGNOSIS — L97511 Non-pressure chronic ulcer of other part of right foot limited to breakdown of skin: Secondary | ICD-10-CM | POA: Diagnosis not present

## 2016-01-04 DIAGNOSIS — K611 Rectal abscess: Secondary | ICD-10-CM | POA: Diagnosis not present

## 2016-01-04 DIAGNOSIS — N189 Chronic kidney disease, unspecified: Secondary | ICD-10-CM | POA: Diagnosis not present

## 2016-01-08 DIAGNOSIS — E1122 Type 2 diabetes mellitus with diabetic chronic kidney disease: Secondary | ICD-10-CM | POA: Diagnosis not present

## 2016-01-08 DIAGNOSIS — E11621 Type 2 diabetes mellitus with foot ulcer: Secondary | ICD-10-CM | POA: Diagnosis not present

## 2016-01-08 DIAGNOSIS — L97511 Non-pressure chronic ulcer of other part of right foot limited to breakdown of skin: Secondary | ICD-10-CM | POA: Diagnosis not present

## 2016-01-08 DIAGNOSIS — K611 Rectal abscess: Secondary | ICD-10-CM | POA: Diagnosis not present

## 2016-01-08 DIAGNOSIS — N189 Chronic kidney disease, unspecified: Secondary | ICD-10-CM | POA: Diagnosis not present

## 2016-01-08 DIAGNOSIS — I129 Hypertensive chronic kidney disease with stage 1 through stage 4 chronic kidney disease, or unspecified chronic kidney disease: Secondary | ICD-10-CM | POA: Diagnosis not present

## 2016-01-11 DIAGNOSIS — L97511 Non-pressure chronic ulcer of other part of right foot limited to breakdown of skin: Secondary | ICD-10-CM | POA: Diagnosis not present

## 2016-01-11 DIAGNOSIS — K611 Rectal abscess: Secondary | ICD-10-CM | POA: Diagnosis not present

## 2016-01-11 DIAGNOSIS — E11621 Type 2 diabetes mellitus with foot ulcer: Secondary | ICD-10-CM | POA: Diagnosis not present

## 2016-01-11 DIAGNOSIS — I129 Hypertensive chronic kidney disease with stage 1 through stage 4 chronic kidney disease, or unspecified chronic kidney disease: Secondary | ICD-10-CM | POA: Diagnosis not present

## 2016-01-11 DIAGNOSIS — N189 Chronic kidney disease, unspecified: Secondary | ICD-10-CM | POA: Diagnosis not present

## 2016-01-11 DIAGNOSIS — E1122 Type 2 diabetes mellitus with diabetic chronic kidney disease: Secondary | ICD-10-CM | POA: Diagnosis not present

## 2016-01-15 DIAGNOSIS — I129 Hypertensive chronic kidney disease with stage 1 through stage 4 chronic kidney disease, or unspecified chronic kidney disease: Secondary | ICD-10-CM | POA: Diagnosis not present

## 2016-01-15 DIAGNOSIS — E11621 Type 2 diabetes mellitus with foot ulcer: Secondary | ICD-10-CM | POA: Diagnosis not present

## 2016-01-15 DIAGNOSIS — L97511 Non-pressure chronic ulcer of other part of right foot limited to breakdown of skin: Secondary | ICD-10-CM | POA: Diagnosis not present

## 2016-01-15 DIAGNOSIS — K611 Rectal abscess: Secondary | ICD-10-CM | POA: Diagnosis not present

## 2016-01-15 DIAGNOSIS — E1122 Type 2 diabetes mellitus with diabetic chronic kidney disease: Secondary | ICD-10-CM | POA: Diagnosis not present

## 2016-01-15 DIAGNOSIS — N189 Chronic kidney disease, unspecified: Secondary | ICD-10-CM | POA: Diagnosis not present

## 2016-01-16 DIAGNOSIS — L97519 Non-pressure chronic ulcer of other part of right foot with unspecified severity: Secondary | ICD-10-CM | POA: Diagnosis not present

## 2016-01-18 DIAGNOSIS — I129 Hypertensive chronic kidney disease with stage 1 through stage 4 chronic kidney disease, or unspecified chronic kidney disease: Secondary | ICD-10-CM | POA: Diagnosis not present

## 2016-01-18 DIAGNOSIS — E11621 Type 2 diabetes mellitus with foot ulcer: Secondary | ICD-10-CM | POA: Diagnosis not present

## 2016-01-18 DIAGNOSIS — K611 Rectal abscess: Secondary | ICD-10-CM | POA: Diagnosis not present

## 2016-01-18 DIAGNOSIS — N189 Chronic kidney disease, unspecified: Secondary | ICD-10-CM | POA: Diagnosis not present

## 2016-01-18 DIAGNOSIS — E1122 Type 2 diabetes mellitus with diabetic chronic kidney disease: Secondary | ICD-10-CM | POA: Diagnosis not present

## 2016-01-18 DIAGNOSIS — L97511 Non-pressure chronic ulcer of other part of right foot limited to breakdown of skin: Secondary | ICD-10-CM | POA: Diagnosis not present

## 2016-01-22 DIAGNOSIS — N189 Chronic kidney disease, unspecified: Secondary | ICD-10-CM | POA: Diagnosis not present

## 2016-01-22 DIAGNOSIS — K611 Rectal abscess: Secondary | ICD-10-CM | POA: Diagnosis not present

## 2016-01-22 DIAGNOSIS — I129 Hypertensive chronic kidney disease with stage 1 through stage 4 chronic kidney disease, or unspecified chronic kidney disease: Secondary | ICD-10-CM | POA: Diagnosis not present

## 2016-01-22 DIAGNOSIS — E11621 Type 2 diabetes mellitus with foot ulcer: Secondary | ICD-10-CM | POA: Diagnosis not present

## 2016-01-22 DIAGNOSIS — L97511 Non-pressure chronic ulcer of other part of right foot limited to breakdown of skin: Secondary | ICD-10-CM | POA: Diagnosis not present

## 2016-01-22 DIAGNOSIS — E1122 Type 2 diabetes mellitus with diabetic chronic kidney disease: Secondary | ICD-10-CM | POA: Diagnosis not present

## 2016-01-29 DIAGNOSIS — I129 Hypertensive chronic kidney disease with stage 1 through stage 4 chronic kidney disease, or unspecified chronic kidney disease: Secondary | ICD-10-CM | POA: Diagnosis not present

## 2016-01-29 DIAGNOSIS — N189 Chronic kidney disease, unspecified: Secondary | ICD-10-CM | POA: Diagnosis not present

## 2016-01-29 DIAGNOSIS — E1122 Type 2 diabetes mellitus with diabetic chronic kidney disease: Secondary | ICD-10-CM | POA: Diagnosis not present

## 2016-01-29 DIAGNOSIS — K611 Rectal abscess: Secondary | ICD-10-CM | POA: Diagnosis not present

## 2016-01-29 DIAGNOSIS — E11621 Type 2 diabetes mellitus with foot ulcer: Secondary | ICD-10-CM | POA: Diagnosis not present

## 2016-01-29 DIAGNOSIS — L97511 Non-pressure chronic ulcer of other part of right foot limited to breakdown of skin: Secondary | ICD-10-CM | POA: Diagnosis not present

## 2016-02-05 DIAGNOSIS — L97511 Non-pressure chronic ulcer of other part of right foot limited to breakdown of skin: Secondary | ICD-10-CM | POA: Diagnosis not present

## 2016-02-05 DIAGNOSIS — I129 Hypertensive chronic kidney disease with stage 1 through stage 4 chronic kidney disease, or unspecified chronic kidney disease: Secondary | ICD-10-CM | POA: Diagnosis not present

## 2016-02-05 DIAGNOSIS — E1122 Type 2 diabetes mellitus with diabetic chronic kidney disease: Secondary | ICD-10-CM | POA: Diagnosis not present

## 2016-02-05 DIAGNOSIS — N189 Chronic kidney disease, unspecified: Secondary | ICD-10-CM | POA: Diagnosis not present

## 2016-02-05 DIAGNOSIS — K611 Rectal abscess: Secondary | ICD-10-CM | POA: Diagnosis not present

## 2016-02-05 DIAGNOSIS — E11621 Type 2 diabetes mellitus with foot ulcer: Secondary | ICD-10-CM | POA: Diagnosis not present

## 2016-02-07 DIAGNOSIS — E1142 Type 2 diabetes mellitus with diabetic polyneuropathy: Secondary | ICD-10-CM | POA: Diagnosis not present

## 2016-02-07 DIAGNOSIS — E782 Mixed hyperlipidemia: Secondary | ICD-10-CM | POA: Diagnosis not present

## 2016-02-07 DIAGNOSIS — M1990 Unspecified osteoarthritis, unspecified site: Secondary | ICD-10-CM | POA: Diagnosis not present

## 2016-02-07 DIAGNOSIS — C61 Malignant neoplasm of prostate: Secondary | ICD-10-CM | POA: Diagnosis not present

## 2016-02-08 DIAGNOSIS — L97511 Non-pressure chronic ulcer of other part of right foot limited to breakdown of skin: Secondary | ICD-10-CM | POA: Diagnosis not present

## 2016-02-08 DIAGNOSIS — I129 Hypertensive chronic kidney disease with stage 1 through stage 4 chronic kidney disease, or unspecified chronic kidney disease: Secondary | ICD-10-CM | POA: Diagnosis not present

## 2016-02-08 DIAGNOSIS — N189 Chronic kidney disease, unspecified: Secondary | ICD-10-CM | POA: Diagnosis not present

## 2016-02-08 DIAGNOSIS — K611 Rectal abscess: Secondary | ICD-10-CM | POA: Diagnosis not present

## 2016-02-08 DIAGNOSIS — E11621 Type 2 diabetes mellitus with foot ulcer: Secondary | ICD-10-CM | POA: Diagnosis not present

## 2016-02-08 DIAGNOSIS — E1122 Type 2 diabetes mellitus with diabetic chronic kidney disease: Secondary | ICD-10-CM | POA: Diagnosis not present

## 2016-02-12 DIAGNOSIS — N189 Chronic kidney disease, unspecified: Secondary | ICD-10-CM | POA: Diagnosis not present

## 2016-02-12 DIAGNOSIS — L97511 Non-pressure chronic ulcer of other part of right foot limited to breakdown of skin: Secondary | ICD-10-CM | POA: Diagnosis not present

## 2016-02-12 DIAGNOSIS — E11621 Type 2 diabetes mellitus with foot ulcer: Secondary | ICD-10-CM | POA: Diagnosis not present

## 2016-02-12 DIAGNOSIS — I129 Hypertensive chronic kidney disease with stage 1 through stage 4 chronic kidney disease, or unspecified chronic kidney disease: Secondary | ICD-10-CM | POA: Diagnosis not present

## 2016-02-12 DIAGNOSIS — K611 Rectal abscess: Secondary | ICD-10-CM | POA: Diagnosis not present

## 2016-02-12 DIAGNOSIS — E1122 Type 2 diabetes mellitus with diabetic chronic kidney disease: Secondary | ICD-10-CM | POA: Diagnosis not present

## 2016-02-13 DIAGNOSIS — E119 Type 2 diabetes mellitus without complications: Secondary | ICD-10-CM | POA: Diagnosis not present

## 2016-02-13 DIAGNOSIS — E7800 Pure hypercholesterolemia, unspecified: Secondary | ICD-10-CM | POA: Diagnosis not present

## 2016-02-14 DIAGNOSIS — I129 Hypertensive chronic kidney disease with stage 1 through stage 4 chronic kidney disease, or unspecified chronic kidney disease: Secondary | ICD-10-CM | POA: Diagnosis not present

## 2016-02-14 DIAGNOSIS — L97511 Non-pressure chronic ulcer of other part of right foot limited to breakdown of skin: Secondary | ICD-10-CM | POA: Diagnosis not present

## 2016-02-14 DIAGNOSIS — I4891 Unspecified atrial fibrillation: Secondary | ICD-10-CM | POA: Diagnosis not present

## 2016-02-14 DIAGNOSIS — Z89512 Acquired absence of left leg below knee: Secondary | ICD-10-CM | POA: Diagnosis not present

## 2016-02-14 DIAGNOSIS — E11621 Type 2 diabetes mellitus with foot ulcer: Secondary | ICD-10-CM | POA: Diagnosis not present

## 2016-02-14 DIAGNOSIS — F419 Anxiety disorder, unspecified: Secondary | ICD-10-CM | POA: Diagnosis not present

## 2016-02-14 DIAGNOSIS — E1122 Type 2 diabetes mellitus with diabetic chronic kidney disease: Secondary | ICD-10-CM | POA: Diagnosis not present

## 2016-02-14 DIAGNOSIS — E785 Hyperlipidemia, unspecified: Secondary | ICD-10-CM | POA: Diagnosis not present

## 2016-02-14 DIAGNOSIS — N189 Chronic kidney disease, unspecified: Secondary | ICD-10-CM | POA: Diagnosis not present

## 2016-02-14 DIAGNOSIS — F329 Major depressive disorder, single episode, unspecified: Secondary | ICD-10-CM | POA: Diagnosis not present

## 2016-02-15 DIAGNOSIS — E1122 Type 2 diabetes mellitus with diabetic chronic kidney disease: Secondary | ICD-10-CM | POA: Diagnosis not present

## 2016-02-15 DIAGNOSIS — I4891 Unspecified atrial fibrillation: Secondary | ICD-10-CM | POA: Diagnosis not present

## 2016-02-15 DIAGNOSIS — E11621 Type 2 diabetes mellitus with foot ulcer: Secondary | ICD-10-CM | POA: Diagnosis not present

## 2016-02-15 DIAGNOSIS — N189 Chronic kidney disease, unspecified: Secondary | ICD-10-CM | POA: Diagnosis not present

## 2016-02-15 DIAGNOSIS — I129 Hypertensive chronic kidney disease with stage 1 through stage 4 chronic kidney disease, or unspecified chronic kidney disease: Secondary | ICD-10-CM | POA: Diagnosis not present

## 2016-02-15 DIAGNOSIS — L97511 Non-pressure chronic ulcer of other part of right foot limited to breakdown of skin: Secondary | ICD-10-CM | POA: Diagnosis not present

## 2016-02-19 DIAGNOSIS — I129 Hypertensive chronic kidney disease with stage 1 through stage 4 chronic kidney disease, or unspecified chronic kidney disease: Secondary | ICD-10-CM | POA: Diagnosis not present

## 2016-02-19 DIAGNOSIS — L97511 Non-pressure chronic ulcer of other part of right foot limited to breakdown of skin: Secondary | ICD-10-CM | POA: Diagnosis not present

## 2016-02-19 DIAGNOSIS — E11621 Type 2 diabetes mellitus with foot ulcer: Secondary | ICD-10-CM | POA: Diagnosis not present

## 2016-02-19 DIAGNOSIS — N189 Chronic kidney disease, unspecified: Secondary | ICD-10-CM | POA: Diagnosis not present

## 2016-02-19 DIAGNOSIS — I4891 Unspecified atrial fibrillation: Secondary | ICD-10-CM | POA: Diagnosis not present

## 2016-02-19 DIAGNOSIS — E1122 Type 2 diabetes mellitus with diabetic chronic kidney disease: Secondary | ICD-10-CM | POA: Diagnosis not present

## 2016-02-21 DIAGNOSIS — D485 Neoplasm of uncertain behavior of skin: Secondary | ICD-10-CM | POA: Diagnosis not present

## 2016-02-21 DIAGNOSIS — D2361 Other benign neoplasm of skin of right upper limb, including shoulder: Secondary | ICD-10-CM | POA: Diagnosis not present

## 2016-02-21 DIAGNOSIS — E1142 Type 2 diabetes mellitus with diabetic polyneuropathy: Secondary | ICD-10-CM | POA: Diagnosis not present

## 2016-02-21 DIAGNOSIS — D2261 Melanocytic nevi of right upper limb, including shoulder: Secondary | ICD-10-CM | POA: Diagnosis not present

## 2016-02-21 DIAGNOSIS — G4710 Hypersomnia, unspecified: Secondary | ICD-10-CM | POA: Diagnosis not present

## 2016-02-21 DIAGNOSIS — E782 Mixed hyperlipidemia: Secondary | ICD-10-CM | POA: Diagnosis not present

## 2016-02-22 DIAGNOSIS — N189 Chronic kidney disease, unspecified: Secondary | ICD-10-CM | POA: Diagnosis not present

## 2016-02-22 DIAGNOSIS — I4891 Unspecified atrial fibrillation: Secondary | ICD-10-CM | POA: Diagnosis not present

## 2016-02-22 DIAGNOSIS — E11621 Type 2 diabetes mellitus with foot ulcer: Secondary | ICD-10-CM | POA: Diagnosis not present

## 2016-02-22 DIAGNOSIS — L97511 Non-pressure chronic ulcer of other part of right foot limited to breakdown of skin: Secondary | ICD-10-CM | POA: Diagnosis not present

## 2016-02-22 DIAGNOSIS — I129 Hypertensive chronic kidney disease with stage 1 through stage 4 chronic kidney disease, or unspecified chronic kidney disease: Secondary | ICD-10-CM | POA: Diagnosis not present

## 2016-02-22 DIAGNOSIS — E1122 Type 2 diabetes mellitus with diabetic chronic kidney disease: Secondary | ICD-10-CM | POA: Diagnosis not present

## 2016-02-27 DIAGNOSIS — L97519 Non-pressure chronic ulcer of other part of right foot with unspecified severity: Secondary | ICD-10-CM | POA: Diagnosis not present

## 2016-02-29 DIAGNOSIS — L97511 Non-pressure chronic ulcer of other part of right foot limited to breakdown of skin: Secondary | ICD-10-CM | POA: Diagnosis not present

## 2016-02-29 DIAGNOSIS — E11621 Type 2 diabetes mellitus with foot ulcer: Secondary | ICD-10-CM | POA: Diagnosis not present

## 2016-02-29 DIAGNOSIS — I4891 Unspecified atrial fibrillation: Secondary | ICD-10-CM | POA: Diagnosis not present

## 2016-02-29 DIAGNOSIS — E1122 Type 2 diabetes mellitus with diabetic chronic kidney disease: Secondary | ICD-10-CM | POA: Diagnosis not present

## 2016-02-29 DIAGNOSIS — N189 Chronic kidney disease, unspecified: Secondary | ICD-10-CM | POA: Diagnosis not present

## 2016-02-29 DIAGNOSIS — I129 Hypertensive chronic kidney disease with stage 1 through stage 4 chronic kidney disease, or unspecified chronic kidney disease: Secondary | ICD-10-CM | POA: Diagnosis not present

## 2016-03-03 DIAGNOSIS — E119 Type 2 diabetes mellitus without complications: Secondary | ICD-10-CM | POA: Diagnosis not present

## 2016-03-03 DIAGNOSIS — E7800 Pure hypercholesterolemia, unspecified: Secondary | ICD-10-CM | POA: Diagnosis not present

## 2016-03-04 DIAGNOSIS — E11621 Type 2 diabetes mellitus with foot ulcer: Secondary | ICD-10-CM | POA: Diagnosis not present

## 2016-03-04 DIAGNOSIS — L97511 Non-pressure chronic ulcer of other part of right foot limited to breakdown of skin: Secondary | ICD-10-CM | POA: Diagnosis not present

## 2016-03-04 DIAGNOSIS — E1122 Type 2 diabetes mellitus with diabetic chronic kidney disease: Secondary | ICD-10-CM | POA: Diagnosis not present

## 2016-03-04 DIAGNOSIS — I129 Hypertensive chronic kidney disease with stage 1 through stage 4 chronic kidney disease, or unspecified chronic kidney disease: Secondary | ICD-10-CM | POA: Diagnosis not present

## 2016-03-04 DIAGNOSIS — N189 Chronic kidney disease, unspecified: Secondary | ICD-10-CM | POA: Diagnosis not present

## 2016-03-04 DIAGNOSIS — I4891 Unspecified atrial fibrillation: Secondary | ICD-10-CM | POA: Diagnosis not present

## 2016-03-05 DIAGNOSIS — L0291 Cutaneous abscess, unspecified: Secondary | ICD-10-CM | POA: Diagnosis not present

## 2016-03-05 DIAGNOSIS — I10 Essential (primary) hypertension: Secondary | ICD-10-CM | POA: Diagnosis not present

## 2016-03-11 DIAGNOSIS — L97511 Non-pressure chronic ulcer of other part of right foot limited to breakdown of skin: Secondary | ICD-10-CM | POA: Diagnosis not present

## 2016-03-11 DIAGNOSIS — E11621 Type 2 diabetes mellitus with foot ulcer: Secondary | ICD-10-CM | POA: Diagnosis not present

## 2016-03-11 DIAGNOSIS — N189 Chronic kidney disease, unspecified: Secondary | ICD-10-CM | POA: Diagnosis not present

## 2016-03-11 DIAGNOSIS — I129 Hypertensive chronic kidney disease with stage 1 through stage 4 chronic kidney disease, or unspecified chronic kidney disease: Secondary | ICD-10-CM | POA: Diagnosis not present

## 2016-03-11 DIAGNOSIS — I4891 Unspecified atrial fibrillation: Secondary | ICD-10-CM | POA: Diagnosis not present

## 2016-03-11 DIAGNOSIS — E1122 Type 2 diabetes mellitus with diabetic chronic kidney disease: Secondary | ICD-10-CM | POA: Diagnosis not present

## 2016-03-12 DIAGNOSIS — E1142 Type 2 diabetes mellitus with diabetic polyneuropathy: Secondary | ICD-10-CM | POA: Diagnosis not present

## 2016-03-12 DIAGNOSIS — L03119 Cellulitis of unspecified part of limb: Secondary | ICD-10-CM | POA: Diagnosis not present

## 2016-03-12 DIAGNOSIS — I4892 Unspecified atrial flutter: Secondary | ICD-10-CM | POA: Diagnosis not present

## 2016-03-18 DIAGNOSIS — L97511 Non-pressure chronic ulcer of other part of right foot limited to breakdown of skin: Secondary | ICD-10-CM | POA: Diagnosis not present

## 2016-03-18 DIAGNOSIS — E1122 Type 2 diabetes mellitus with diabetic chronic kidney disease: Secondary | ICD-10-CM | POA: Diagnosis not present

## 2016-03-18 DIAGNOSIS — E11621 Type 2 diabetes mellitus with foot ulcer: Secondary | ICD-10-CM | POA: Diagnosis not present

## 2016-03-18 DIAGNOSIS — N189 Chronic kidney disease, unspecified: Secondary | ICD-10-CM | POA: Diagnosis not present

## 2016-03-18 DIAGNOSIS — I129 Hypertensive chronic kidney disease with stage 1 through stage 4 chronic kidney disease, or unspecified chronic kidney disease: Secondary | ICD-10-CM | POA: Diagnosis not present

## 2016-03-18 DIAGNOSIS — I4891 Unspecified atrial fibrillation: Secondary | ICD-10-CM | POA: Diagnosis not present

## 2016-03-19 DIAGNOSIS — E1140 Type 2 diabetes mellitus with diabetic neuropathy, unspecified: Secondary | ICD-10-CM | POA: Diagnosis not present

## 2016-03-19 DIAGNOSIS — E1151 Type 2 diabetes mellitus with diabetic peripheral angiopathy without gangrene: Secondary | ICD-10-CM | POA: Diagnosis not present

## 2016-03-19 DIAGNOSIS — L89892 Pressure ulcer of other site, stage 2: Secondary | ICD-10-CM | POA: Diagnosis not present

## 2016-03-20 DIAGNOSIS — H43393 Other vitreous opacities, bilateral: Secondary | ICD-10-CM | POA: Diagnosis not present

## 2016-03-20 DIAGNOSIS — H2513 Age-related nuclear cataract, bilateral: Secondary | ICD-10-CM | POA: Diagnosis not present

## 2016-03-20 DIAGNOSIS — H25013 Cortical age-related cataract, bilateral: Secondary | ICD-10-CM | POA: Diagnosis not present

## 2016-03-20 DIAGNOSIS — E119 Type 2 diabetes mellitus without complications: Secondary | ICD-10-CM | POA: Diagnosis not present

## 2016-03-20 DIAGNOSIS — H5213 Myopia, bilateral: Secondary | ICD-10-CM | POA: Diagnosis not present

## 2016-03-20 DIAGNOSIS — H35033 Hypertensive retinopathy, bilateral: Secondary | ICD-10-CM | POA: Diagnosis not present

## 2016-03-20 DIAGNOSIS — H524 Presbyopia: Secondary | ICD-10-CM | POA: Diagnosis not present

## 2016-03-20 DIAGNOSIS — H25032 Anterior subcapsular polar age-related cataract, left eye: Secondary | ICD-10-CM | POA: Diagnosis not present

## 2016-03-20 DIAGNOSIS — H52223 Regular astigmatism, bilateral: Secondary | ICD-10-CM | POA: Diagnosis not present

## 2016-03-20 DIAGNOSIS — I10 Essential (primary) hypertension: Secondary | ICD-10-CM | POA: Diagnosis not present

## 2016-03-20 DIAGNOSIS — H40013 Open angle with borderline findings, low risk, bilateral: Secondary | ICD-10-CM | POA: Diagnosis not present

## 2016-03-25 DIAGNOSIS — E1122 Type 2 diabetes mellitus with diabetic chronic kidney disease: Secondary | ICD-10-CM | POA: Diagnosis not present

## 2016-03-25 DIAGNOSIS — I129 Hypertensive chronic kidney disease with stage 1 through stage 4 chronic kidney disease, or unspecified chronic kidney disease: Secondary | ICD-10-CM | POA: Diagnosis not present

## 2016-03-25 DIAGNOSIS — E11621 Type 2 diabetes mellitus with foot ulcer: Secondary | ICD-10-CM | POA: Diagnosis not present

## 2016-03-25 DIAGNOSIS — N189 Chronic kidney disease, unspecified: Secondary | ICD-10-CM | POA: Diagnosis not present

## 2016-03-25 DIAGNOSIS — L97511 Non-pressure chronic ulcer of other part of right foot limited to breakdown of skin: Secondary | ICD-10-CM | POA: Diagnosis not present

## 2016-03-25 DIAGNOSIS — I4891 Unspecified atrial fibrillation: Secondary | ICD-10-CM | POA: Diagnosis not present

## 2016-04-02 DIAGNOSIS — I129 Hypertensive chronic kidney disease with stage 1 through stage 4 chronic kidney disease, or unspecified chronic kidney disease: Secondary | ICD-10-CM | POA: Diagnosis not present

## 2016-04-02 DIAGNOSIS — I4891 Unspecified atrial fibrillation: Secondary | ICD-10-CM | POA: Diagnosis not present

## 2016-04-02 DIAGNOSIS — E11621 Type 2 diabetes mellitus with foot ulcer: Secondary | ICD-10-CM | POA: Diagnosis not present

## 2016-04-02 DIAGNOSIS — N189 Chronic kidney disease, unspecified: Secondary | ICD-10-CM | POA: Diagnosis not present

## 2016-04-02 DIAGNOSIS — L97511 Non-pressure chronic ulcer of other part of right foot limited to breakdown of skin: Secondary | ICD-10-CM | POA: Diagnosis not present

## 2016-04-02 DIAGNOSIS — E1122 Type 2 diabetes mellitus with diabetic chronic kidney disease: Secondary | ICD-10-CM | POA: Diagnosis not present

## 2016-04-03 DIAGNOSIS — R8299 Other abnormal findings in urine: Secondary | ICD-10-CM | POA: Diagnosis not present

## 2016-04-03 DIAGNOSIS — C61 Malignant neoplasm of prostate: Secondary | ICD-10-CM | POA: Diagnosis not present

## 2016-04-03 DIAGNOSIS — N281 Cyst of kidney, acquired: Secondary | ICD-10-CM | POA: Diagnosis not present

## 2016-04-03 DIAGNOSIS — R972 Elevated prostate specific antigen [PSA]: Secondary | ICD-10-CM | POA: Diagnosis not present

## 2016-04-03 DIAGNOSIS — N401 Enlarged prostate with lower urinary tract symptoms: Secondary | ICD-10-CM | POA: Diagnosis not present

## 2016-04-03 DIAGNOSIS — Z87448 Personal history of other diseases of urinary system: Secondary | ICD-10-CM | POA: Diagnosis not present

## 2016-04-09 DIAGNOSIS — E119 Type 2 diabetes mellitus without complications: Secondary | ICD-10-CM | POA: Diagnosis not present

## 2016-04-09 DIAGNOSIS — I4891 Unspecified atrial fibrillation: Secondary | ICD-10-CM | POA: Diagnosis not present

## 2016-04-09 DIAGNOSIS — I129 Hypertensive chronic kidney disease with stage 1 through stage 4 chronic kidney disease, or unspecified chronic kidney disease: Secondary | ICD-10-CM | POA: Diagnosis not present

## 2016-04-09 DIAGNOSIS — N189 Chronic kidney disease, unspecified: Secondary | ICD-10-CM | POA: Diagnosis not present

## 2016-04-09 DIAGNOSIS — E7800 Pure hypercholesterolemia, unspecified: Secondary | ICD-10-CM | POA: Diagnosis not present

## 2016-04-09 DIAGNOSIS — E1122 Type 2 diabetes mellitus with diabetic chronic kidney disease: Secondary | ICD-10-CM | POA: Diagnosis not present

## 2016-04-09 DIAGNOSIS — L97511 Non-pressure chronic ulcer of other part of right foot limited to breakdown of skin: Secondary | ICD-10-CM | POA: Diagnosis not present

## 2016-04-09 DIAGNOSIS — E11621 Type 2 diabetes mellitus with foot ulcer: Secondary | ICD-10-CM | POA: Diagnosis not present

## 2016-04-14 DIAGNOSIS — N189 Chronic kidney disease, unspecified: Secondary | ICD-10-CM | POA: Diagnosis not present

## 2016-04-14 DIAGNOSIS — F329 Major depressive disorder, single episode, unspecified: Secondary | ICD-10-CM | POA: Diagnosis not present

## 2016-04-14 DIAGNOSIS — L97511 Non-pressure chronic ulcer of other part of right foot limited to breakdown of skin: Secondary | ICD-10-CM | POA: Diagnosis not present

## 2016-04-14 DIAGNOSIS — E1122 Type 2 diabetes mellitus with diabetic chronic kidney disease: Secondary | ICD-10-CM | POA: Diagnosis not present

## 2016-04-14 DIAGNOSIS — Z89512 Acquired absence of left leg below knee: Secondary | ICD-10-CM | POA: Diagnosis not present

## 2016-04-14 DIAGNOSIS — E785 Hyperlipidemia, unspecified: Secondary | ICD-10-CM | POA: Diagnosis not present

## 2016-04-14 DIAGNOSIS — F419 Anxiety disorder, unspecified: Secondary | ICD-10-CM | POA: Diagnosis not present

## 2016-04-14 DIAGNOSIS — E11621 Type 2 diabetes mellitus with foot ulcer: Secondary | ICD-10-CM | POA: Diagnosis not present

## 2016-04-14 DIAGNOSIS — I129 Hypertensive chronic kidney disease with stage 1 through stage 4 chronic kidney disease, or unspecified chronic kidney disease: Secondary | ICD-10-CM | POA: Diagnosis not present

## 2016-04-14 DIAGNOSIS — I4891 Unspecified atrial fibrillation: Secondary | ICD-10-CM | POA: Diagnosis not present

## 2016-04-15 DIAGNOSIS — E1151 Type 2 diabetes mellitus with diabetic peripheral angiopathy without gangrene: Secondary | ICD-10-CM | POA: Diagnosis not present

## 2016-04-15 DIAGNOSIS — L89892 Pressure ulcer of other site, stage 2: Secondary | ICD-10-CM | POA: Diagnosis not present

## 2016-04-15 DIAGNOSIS — E1140 Type 2 diabetes mellitus with diabetic neuropathy, unspecified: Secondary | ICD-10-CM | POA: Diagnosis not present

## 2016-04-17 DIAGNOSIS — E1122 Type 2 diabetes mellitus with diabetic chronic kidney disease: Secondary | ICD-10-CM | POA: Diagnosis not present

## 2016-04-17 DIAGNOSIS — L97511 Non-pressure chronic ulcer of other part of right foot limited to breakdown of skin: Secondary | ICD-10-CM | POA: Diagnosis not present

## 2016-04-17 DIAGNOSIS — I4891 Unspecified atrial fibrillation: Secondary | ICD-10-CM | POA: Diagnosis not present

## 2016-04-17 DIAGNOSIS — I129 Hypertensive chronic kidney disease with stage 1 through stage 4 chronic kidney disease, or unspecified chronic kidney disease: Secondary | ICD-10-CM | POA: Diagnosis not present

## 2016-04-17 DIAGNOSIS — N189 Chronic kidney disease, unspecified: Secondary | ICD-10-CM | POA: Diagnosis not present

## 2016-04-17 DIAGNOSIS — E11621 Type 2 diabetes mellitus with foot ulcer: Secondary | ICD-10-CM | POA: Diagnosis not present

## 2016-04-28 DIAGNOSIS — I129 Hypertensive chronic kidney disease with stage 1 through stage 4 chronic kidney disease, or unspecified chronic kidney disease: Secondary | ICD-10-CM | POA: Diagnosis not present

## 2016-04-28 DIAGNOSIS — E1122 Type 2 diabetes mellitus with diabetic chronic kidney disease: Secondary | ICD-10-CM | POA: Diagnosis not present

## 2016-04-28 DIAGNOSIS — L97511 Non-pressure chronic ulcer of other part of right foot limited to breakdown of skin: Secondary | ICD-10-CM | POA: Diagnosis not present

## 2016-04-28 DIAGNOSIS — E11621 Type 2 diabetes mellitus with foot ulcer: Secondary | ICD-10-CM | POA: Diagnosis not present

## 2016-04-28 DIAGNOSIS — I4891 Unspecified atrial fibrillation: Secondary | ICD-10-CM | POA: Diagnosis not present

## 2016-04-28 DIAGNOSIS — N189 Chronic kidney disease, unspecified: Secondary | ICD-10-CM | POA: Diagnosis not present

## 2016-05-01 DIAGNOSIS — I129 Hypertensive chronic kidney disease with stage 1 through stage 4 chronic kidney disease, or unspecified chronic kidney disease: Secondary | ICD-10-CM | POA: Diagnosis not present

## 2016-05-01 DIAGNOSIS — E11621 Type 2 diabetes mellitus with foot ulcer: Secondary | ICD-10-CM | POA: Diagnosis not present

## 2016-05-01 DIAGNOSIS — N189 Chronic kidney disease, unspecified: Secondary | ICD-10-CM | POA: Diagnosis not present

## 2016-05-01 DIAGNOSIS — E1122 Type 2 diabetes mellitus with diabetic chronic kidney disease: Secondary | ICD-10-CM | POA: Diagnosis not present

## 2016-05-01 DIAGNOSIS — L97511 Non-pressure chronic ulcer of other part of right foot limited to breakdown of skin: Secondary | ICD-10-CM | POA: Diagnosis not present

## 2016-05-01 DIAGNOSIS — I4891 Unspecified atrial fibrillation: Secondary | ICD-10-CM | POA: Diagnosis not present

## 2016-05-06 DIAGNOSIS — L89892 Pressure ulcer of other site, stage 2: Secondary | ICD-10-CM | POA: Diagnosis not present

## 2016-05-06 DIAGNOSIS — E1151 Type 2 diabetes mellitus with diabetic peripheral angiopathy without gangrene: Secondary | ICD-10-CM | POA: Diagnosis not present

## 2016-05-06 DIAGNOSIS — E1140 Type 2 diabetes mellitus with diabetic neuropathy, unspecified: Secondary | ICD-10-CM | POA: Diagnosis not present

## 2016-05-07 DIAGNOSIS — I129 Hypertensive chronic kidney disease with stage 1 through stage 4 chronic kidney disease, or unspecified chronic kidney disease: Secondary | ICD-10-CM | POA: Diagnosis not present

## 2016-05-07 DIAGNOSIS — E1122 Type 2 diabetes mellitus with diabetic chronic kidney disease: Secondary | ICD-10-CM | POA: Diagnosis not present

## 2016-05-07 DIAGNOSIS — N189 Chronic kidney disease, unspecified: Secondary | ICD-10-CM | POA: Diagnosis not present

## 2016-05-07 DIAGNOSIS — I4891 Unspecified atrial fibrillation: Secondary | ICD-10-CM | POA: Diagnosis not present

## 2016-05-07 DIAGNOSIS — E11621 Type 2 diabetes mellitus with foot ulcer: Secondary | ICD-10-CM | POA: Diagnosis not present

## 2016-05-07 DIAGNOSIS — L97511 Non-pressure chronic ulcer of other part of right foot limited to breakdown of skin: Secondary | ICD-10-CM | POA: Diagnosis not present

## 2016-05-14 DIAGNOSIS — E1122 Type 2 diabetes mellitus with diabetic chronic kidney disease: Secondary | ICD-10-CM | POA: Diagnosis not present

## 2016-05-14 DIAGNOSIS — I129 Hypertensive chronic kidney disease with stage 1 through stage 4 chronic kidney disease, or unspecified chronic kidney disease: Secondary | ICD-10-CM | POA: Diagnosis not present

## 2016-05-14 DIAGNOSIS — L97511 Non-pressure chronic ulcer of other part of right foot limited to breakdown of skin: Secondary | ICD-10-CM | POA: Diagnosis not present

## 2016-05-14 DIAGNOSIS — E11621 Type 2 diabetes mellitus with foot ulcer: Secondary | ICD-10-CM | POA: Diagnosis not present

## 2016-05-14 DIAGNOSIS — N189 Chronic kidney disease, unspecified: Secondary | ICD-10-CM | POA: Diagnosis not present

## 2016-05-14 DIAGNOSIS — I4891 Unspecified atrial fibrillation: Secondary | ICD-10-CM | POA: Diagnosis not present

## 2016-05-15 DIAGNOSIS — Z299 Encounter for prophylactic measures, unspecified: Secondary | ICD-10-CM | POA: Diagnosis not present

## 2016-05-15 DIAGNOSIS — E782 Mixed hyperlipidemia: Secondary | ICD-10-CM | POA: Diagnosis not present

## 2016-05-15 DIAGNOSIS — I4892 Unspecified atrial flutter: Secondary | ICD-10-CM | POA: Diagnosis not present

## 2016-05-15 DIAGNOSIS — E1142 Type 2 diabetes mellitus with diabetic polyneuropathy: Secondary | ICD-10-CM | POA: Diagnosis not present

## 2016-05-15 DIAGNOSIS — E1140 Type 2 diabetes mellitus with diabetic neuropathy, unspecified: Secondary | ICD-10-CM | POA: Diagnosis not present

## 2016-05-21 DIAGNOSIS — I129 Hypertensive chronic kidney disease with stage 1 through stage 4 chronic kidney disease, or unspecified chronic kidney disease: Secondary | ICD-10-CM | POA: Diagnosis not present

## 2016-05-21 DIAGNOSIS — E1122 Type 2 diabetes mellitus with diabetic chronic kidney disease: Secondary | ICD-10-CM | POA: Diagnosis not present

## 2016-05-21 DIAGNOSIS — E11621 Type 2 diabetes mellitus with foot ulcer: Secondary | ICD-10-CM | POA: Diagnosis not present

## 2016-05-21 DIAGNOSIS — I4891 Unspecified atrial fibrillation: Secondary | ICD-10-CM | POA: Diagnosis not present

## 2016-05-21 DIAGNOSIS — N189 Chronic kidney disease, unspecified: Secondary | ICD-10-CM | POA: Diagnosis not present

## 2016-05-21 DIAGNOSIS — L97511 Non-pressure chronic ulcer of other part of right foot limited to breakdown of skin: Secondary | ICD-10-CM | POA: Diagnosis not present

## 2016-05-29 DIAGNOSIS — I129 Hypertensive chronic kidney disease with stage 1 through stage 4 chronic kidney disease, or unspecified chronic kidney disease: Secondary | ICD-10-CM | POA: Diagnosis not present

## 2016-05-29 DIAGNOSIS — E11621 Type 2 diabetes mellitus with foot ulcer: Secondary | ICD-10-CM | POA: Diagnosis not present

## 2016-05-29 DIAGNOSIS — L97511 Non-pressure chronic ulcer of other part of right foot limited to breakdown of skin: Secondary | ICD-10-CM | POA: Diagnosis not present

## 2016-05-29 DIAGNOSIS — E1122 Type 2 diabetes mellitus with diabetic chronic kidney disease: Secondary | ICD-10-CM | POA: Diagnosis not present

## 2016-05-29 DIAGNOSIS — I4891 Unspecified atrial fibrillation: Secondary | ICD-10-CM | POA: Diagnosis not present

## 2016-05-29 DIAGNOSIS — N189 Chronic kidney disease, unspecified: Secondary | ICD-10-CM | POA: Diagnosis not present

## 2016-06-09 DIAGNOSIS — E11621 Type 2 diabetes mellitus with foot ulcer: Secondary | ICD-10-CM | POA: Diagnosis not present

## 2016-06-09 DIAGNOSIS — N189 Chronic kidney disease, unspecified: Secondary | ICD-10-CM | POA: Diagnosis not present

## 2016-06-09 DIAGNOSIS — I129 Hypertensive chronic kidney disease with stage 1 through stage 4 chronic kidney disease, or unspecified chronic kidney disease: Secondary | ICD-10-CM | POA: Diagnosis not present

## 2016-06-09 DIAGNOSIS — L97511 Non-pressure chronic ulcer of other part of right foot limited to breakdown of skin: Secondary | ICD-10-CM | POA: Diagnosis not present

## 2016-06-09 DIAGNOSIS — E1151 Type 2 diabetes mellitus with diabetic peripheral angiopathy without gangrene: Secondary | ICD-10-CM | POA: Diagnosis not present

## 2016-06-09 DIAGNOSIS — E1122 Type 2 diabetes mellitus with diabetic chronic kidney disease: Secondary | ICD-10-CM | POA: Diagnosis not present

## 2016-06-09 DIAGNOSIS — L89892 Pressure ulcer of other site, stage 2: Secondary | ICD-10-CM | POA: Diagnosis not present

## 2016-06-09 DIAGNOSIS — E1140 Type 2 diabetes mellitus with diabetic neuropathy, unspecified: Secondary | ICD-10-CM | POA: Diagnosis not present

## 2016-06-09 DIAGNOSIS — I4891 Unspecified atrial fibrillation: Secondary | ICD-10-CM | POA: Diagnosis not present

## 2016-06-18 DIAGNOSIS — E119 Type 2 diabetes mellitus without complications: Secondary | ICD-10-CM | POA: Diagnosis not present

## 2016-06-18 DIAGNOSIS — E7800 Pure hypercholesterolemia, unspecified: Secondary | ICD-10-CM | POA: Diagnosis not present

## 2016-07-01 DIAGNOSIS — E1142 Type 2 diabetes mellitus with diabetic polyneuropathy: Secondary | ICD-10-CM | POA: Diagnosis not present

## 2016-07-01 DIAGNOSIS — Z1211 Encounter for screening for malignant neoplasm of colon: Secondary | ICD-10-CM | POA: Diagnosis not present

## 2016-07-01 DIAGNOSIS — Z299 Encounter for prophylactic measures, unspecified: Secondary | ICD-10-CM | POA: Diagnosis not present

## 2016-07-01 DIAGNOSIS — E7800 Pure hypercholesterolemia, unspecified: Secondary | ICD-10-CM | POA: Diagnosis not present

## 2016-07-01 DIAGNOSIS — Z0000 Encounter for general adult medical examination without abnormal findings: Secondary | ICD-10-CM | POA: Diagnosis not present

## 2016-07-01 DIAGNOSIS — R5383 Other fatigue: Secondary | ICD-10-CM | POA: Diagnosis not present

## 2016-07-01 DIAGNOSIS — Z7189 Other specified counseling: Secondary | ICD-10-CM | POA: Diagnosis not present

## 2016-07-01 DIAGNOSIS — Z125 Encounter for screening for malignant neoplasm of prostate: Secondary | ICD-10-CM | POA: Diagnosis not present

## 2016-07-01 DIAGNOSIS — Z713 Dietary counseling and surveillance: Secondary | ICD-10-CM | POA: Diagnosis not present

## 2016-07-01 DIAGNOSIS — Z1389 Encounter for screening for other disorder: Secondary | ICD-10-CM | POA: Diagnosis not present

## 2016-07-15 DIAGNOSIS — L89892 Pressure ulcer of other site, stage 2: Secondary | ICD-10-CM | POA: Diagnosis not present

## 2016-07-15 DIAGNOSIS — E1140 Type 2 diabetes mellitus with diabetic neuropathy, unspecified: Secondary | ICD-10-CM | POA: Diagnosis not present

## 2016-07-15 DIAGNOSIS — E1151 Type 2 diabetes mellitus with diabetic peripheral angiopathy without gangrene: Secondary | ICD-10-CM | POA: Diagnosis not present

## 2016-08-15 DIAGNOSIS — E1142 Type 2 diabetes mellitus with diabetic polyneuropathy: Secondary | ICD-10-CM | POA: Diagnosis not present

## 2016-08-15 DIAGNOSIS — E782 Mixed hyperlipidemia: Secondary | ICD-10-CM | POA: Diagnosis not present

## 2016-08-15 DIAGNOSIS — E1121 Type 2 diabetes mellitus with diabetic nephropathy: Secondary | ICD-10-CM | POA: Diagnosis not present

## 2016-08-15 DIAGNOSIS — Z6833 Body mass index (BMI) 33.0-33.9, adult: Secondary | ICD-10-CM | POA: Diagnosis not present

## 2016-08-15 DIAGNOSIS — Z299 Encounter for prophylactic measures, unspecified: Secondary | ICD-10-CM | POA: Diagnosis not present

## 2016-08-21 DIAGNOSIS — Z6833 Body mass index (BMI) 33.0-33.9, adult: Secondary | ICD-10-CM | POA: Diagnosis not present

## 2016-08-21 DIAGNOSIS — C44319 Basal cell carcinoma of skin of other parts of face: Secondary | ICD-10-CM | POA: Diagnosis not present

## 2016-08-21 DIAGNOSIS — Z299 Encounter for prophylactic measures, unspecified: Secondary | ICD-10-CM | POA: Diagnosis not present

## 2016-08-21 DIAGNOSIS — N183 Chronic kidney disease, stage 3 (moderate): Secondary | ICD-10-CM | POA: Diagnosis not present

## 2016-08-21 DIAGNOSIS — I4892 Unspecified atrial flutter: Secondary | ICD-10-CM | POA: Diagnosis not present

## 2016-08-21 DIAGNOSIS — E1122 Type 2 diabetes mellitus with diabetic chronic kidney disease: Secondary | ICD-10-CM | POA: Diagnosis not present

## 2016-08-26 DIAGNOSIS — E1140 Type 2 diabetes mellitus with diabetic neuropathy, unspecified: Secondary | ICD-10-CM | POA: Diagnosis not present

## 2016-08-26 DIAGNOSIS — E1151 Type 2 diabetes mellitus with diabetic peripheral angiopathy without gangrene: Secondary | ICD-10-CM | POA: Diagnosis not present

## 2016-08-26 DIAGNOSIS — L89892 Pressure ulcer of other site, stage 2: Secondary | ICD-10-CM | POA: Diagnosis not present

## 2016-09-01 DIAGNOSIS — E7800 Pure hypercholesterolemia, unspecified: Secondary | ICD-10-CM | POA: Diagnosis not present

## 2016-09-01 DIAGNOSIS — E119 Type 2 diabetes mellitus without complications: Secondary | ICD-10-CM | POA: Diagnosis not present

## 2016-09-23 DIAGNOSIS — G4710 Hypersomnia, unspecified: Secondary | ICD-10-CM | POA: Diagnosis not present

## 2016-09-23 DIAGNOSIS — E1151 Type 2 diabetes mellitus with diabetic peripheral angiopathy without gangrene: Secondary | ICD-10-CM | POA: Diagnosis not present

## 2016-09-23 DIAGNOSIS — Z299 Encounter for prophylactic measures, unspecified: Secondary | ICD-10-CM | POA: Diagnosis not present

## 2016-09-23 DIAGNOSIS — Z6835 Body mass index (BMI) 35.0-35.9, adult: Secondary | ICD-10-CM | POA: Diagnosis not present

## 2016-09-23 DIAGNOSIS — N183 Chronic kidney disease, stage 3 (moderate): Secondary | ICD-10-CM | POA: Diagnosis not present

## 2016-09-23 DIAGNOSIS — E1140 Type 2 diabetes mellitus with diabetic neuropathy, unspecified: Secondary | ICD-10-CM | POA: Diagnosis not present

## 2016-09-23 DIAGNOSIS — E1122 Type 2 diabetes mellitus with diabetic chronic kidney disease: Secondary | ICD-10-CM | POA: Diagnosis not present

## 2016-09-23 DIAGNOSIS — L89892 Pressure ulcer of other site, stage 2: Secondary | ICD-10-CM | POA: Diagnosis not present

## 2016-09-25 DIAGNOSIS — H25013 Cortical age-related cataract, bilateral: Secondary | ICD-10-CM | POA: Diagnosis not present

## 2016-09-25 DIAGNOSIS — H2513 Age-related nuclear cataract, bilateral: Secondary | ICD-10-CM | POA: Diagnosis not present

## 2016-09-25 DIAGNOSIS — H25032 Anterior subcapsular polar age-related cataract, left eye: Secondary | ICD-10-CM | POA: Diagnosis not present

## 2016-09-25 DIAGNOSIS — H43393 Other vitreous opacities, bilateral: Secondary | ICD-10-CM | POA: Diagnosis not present

## 2016-09-25 DIAGNOSIS — H40013 Open angle with borderline findings, low risk, bilateral: Secondary | ICD-10-CM | POA: Diagnosis not present

## 2016-11-04 DIAGNOSIS — L89892 Pressure ulcer of other site, stage 2: Secondary | ICD-10-CM | POA: Diagnosis not present

## 2016-11-04 DIAGNOSIS — E1140 Type 2 diabetes mellitus with diabetic neuropathy, unspecified: Secondary | ICD-10-CM | POA: Diagnosis not present

## 2016-11-04 DIAGNOSIS — E1151 Type 2 diabetes mellitus with diabetic peripheral angiopathy without gangrene: Secondary | ICD-10-CM | POA: Diagnosis not present

## 2016-11-10 DIAGNOSIS — E119 Type 2 diabetes mellitus without complications: Secondary | ICD-10-CM | POA: Diagnosis not present

## 2016-11-10 DIAGNOSIS — E7800 Pure hypercholesterolemia, unspecified: Secondary | ICD-10-CM | POA: Diagnosis not present

## 2016-11-18 DIAGNOSIS — E1151 Type 2 diabetes mellitus with diabetic peripheral angiopathy without gangrene: Secondary | ICD-10-CM | POA: Diagnosis not present

## 2016-11-18 DIAGNOSIS — E1140 Type 2 diabetes mellitus with diabetic neuropathy, unspecified: Secondary | ICD-10-CM | POA: Diagnosis not present

## 2016-11-18 DIAGNOSIS — L89892 Pressure ulcer of other site, stage 2: Secondary | ICD-10-CM | POA: Diagnosis not present

## 2016-11-20 DIAGNOSIS — Z6835 Body mass index (BMI) 35.0-35.9, adult: Secondary | ICD-10-CM | POA: Diagnosis not present

## 2016-11-20 DIAGNOSIS — E1122 Type 2 diabetes mellitus with diabetic chronic kidney disease: Secondary | ICD-10-CM | POA: Diagnosis not present

## 2016-11-20 DIAGNOSIS — I4892 Unspecified atrial flutter: Secondary | ICD-10-CM | POA: Diagnosis not present

## 2016-11-20 DIAGNOSIS — N183 Chronic kidney disease, stage 3 (moderate): Secondary | ICD-10-CM | POA: Diagnosis not present

## 2016-11-20 DIAGNOSIS — C61 Malignant neoplasm of prostate: Secondary | ICD-10-CM | POA: Diagnosis not present

## 2016-11-20 DIAGNOSIS — E1142 Type 2 diabetes mellitus with diabetic polyneuropathy: Secondary | ICD-10-CM | POA: Diagnosis not present

## 2016-11-20 DIAGNOSIS — Z299 Encounter for prophylactic measures, unspecified: Secondary | ICD-10-CM | POA: Diagnosis not present

## 2016-11-20 DIAGNOSIS — S88112S Complete traumatic amputation at level between knee and ankle, left lower leg, sequela: Secondary | ICD-10-CM | POA: Diagnosis not present

## 2016-11-20 DIAGNOSIS — L089 Local infection of the skin and subcutaneous tissue, unspecified: Secondary | ICD-10-CM | POA: Diagnosis not present

## 2016-11-20 DIAGNOSIS — Z713 Dietary counseling and surveillance: Secondary | ICD-10-CM | POA: Diagnosis not present

## 2016-11-25 DIAGNOSIS — E1151 Type 2 diabetes mellitus with diabetic peripheral angiopathy without gangrene: Secondary | ICD-10-CM | POA: Diagnosis not present

## 2016-11-25 DIAGNOSIS — E1140 Type 2 diabetes mellitus with diabetic neuropathy, unspecified: Secondary | ICD-10-CM | POA: Diagnosis not present

## 2016-11-25 DIAGNOSIS — L89892 Pressure ulcer of other site, stage 2: Secondary | ICD-10-CM | POA: Diagnosis not present

## 2016-12-02 DIAGNOSIS — E119 Type 2 diabetes mellitus without complications: Secondary | ICD-10-CM | POA: Diagnosis not present

## 2016-12-02 DIAGNOSIS — E7800 Pure hypercholesterolemia, unspecified: Secondary | ICD-10-CM | POA: Diagnosis not present

## 2016-12-09 DIAGNOSIS — E1151 Type 2 diabetes mellitus with diabetic peripheral angiopathy without gangrene: Secondary | ICD-10-CM | POA: Diagnosis not present

## 2016-12-09 DIAGNOSIS — E1140 Type 2 diabetes mellitus with diabetic neuropathy, unspecified: Secondary | ICD-10-CM | POA: Diagnosis not present

## 2016-12-09 DIAGNOSIS — L89892 Pressure ulcer of other site, stage 2: Secondary | ICD-10-CM | POA: Diagnosis not present

## 2016-12-17 DIAGNOSIS — C61 Malignant neoplasm of prostate: Secondary | ICD-10-CM | POA: Diagnosis not present

## 2016-12-25 DIAGNOSIS — C61 Malignant neoplasm of prostate: Secondary | ICD-10-CM | POA: Diagnosis not present

## 2016-12-25 DIAGNOSIS — N281 Cyst of kidney, acquired: Secondary | ICD-10-CM | POA: Diagnosis not present

## 2016-12-30 DIAGNOSIS — E1151 Type 2 diabetes mellitus with diabetic peripheral angiopathy without gangrene: Secondary | ICD-10-CM | POA: Diagnosis not present

## 2016-12-30 DIAGNOSIS — L89892 Pressure ulcer of other site, stage 2: Secondary | ICD-10-CM | POA: Diagnosis not present

## 2016-12-30 DIAGNOSIS — E1140 Type 2 diabetes mellitus with diabetic neuropathy, unspecified: Secondary | ICD-10-CM | POA: Diagnosis not present

## 2016-12-31 DIAGNOSIS — E7800 Pure hypercholesterolemia, unspecified: Secondary | ICD-10-CM | POA: Diagnosis not present

## 2016-12-31 DIAGNOSIS — E119 Type 2 diabetes mellitus without complications: Secondary | ICD-10-CM | POA: Diagnosis not present

## 2017-01-20 DIAGNOSIS — L89892 Pressure ulcer of other site, stage 2: Secondary | ICD-10-CM | POA: Diagnosis not present

## 2017-01-20 DIAGNOSIS — E1140 Type 2 diabetes mellitus with diabetic neuropathy, unspecified: Secondary | ICD-10-CM | POA: Diagnosis not present

## 2017-01-20 DIAGNOSIS — E1151 Type 2 diabetes mellitus with diabetic peripheral angiopathy without gangrene: Secondary | ICD-10-CM | POA: Diagnosis not present

## 2017-01-29 DIAGNOSIS — E7800 Pure hypercholesterolemia, unspecified: Secondary | ICD-10-CM | POA: Diagnosis not present

## 2017-01-29 DIAGNOSIS — E119 Type 2 diabetes mellitus without complications: Secondary | ICD-10-CM | POA: Diagnosis not present

## 2017-02-03 DIAGNOSIS — L89892 Pressure ulcer of other site, stage 2: Secondary | ICD-10-CM | POA: Diagnosis not present

## 2017-02-03 DIAGNOSIS — E1140 Type 2 diabetes mellitus with diabetic neuropathy, unspecified: Secondary | ICD-10-CM | POA: Diagnosis not present

## 2017-02-03 DIAGNOSIS — E1151 Type 2 diabetes mellitus with diabetic peripheral angiopathy without gangrene: Secondary | ICD-10-CM | POA: Diagnosis not present

## 2017-02-17 DIAGNOSIS — L89892 Pressure ulcer of other site, stage 2: Secondary | ICD-10-CM | POA: Diagnosis not present

## 2017-02-17 DIAGNOSIS — E1140 Type 2 diabetes mellitus with diabetic neuropathy, unspecified: Secondary | ICD-10-CM | POA: Diagnosis not present

## 2017-02-17 DIAGNOSIS — E1151 Type 2 diabetes mellitus with diabetic peripheral angiopathy without gangrene: Secondary | ICD-10-CM | POA: Diagnosis not present

## 2017-02-18 DIAGNOSIS — E7800 Pure hypercholesterolemia, unspecified: Secondary | ICD-10-CM | POA: Diagnosis not present

## 2017-02-18 DIAGNOSIS — E119 Type 2 diabetes mellitus without complications: Secondary | ICD-10-CM | POA: Diagnosis not present

## 2017-02-25 DIAGNOSIS — Z89512 Acquired absence of left leg below knee: Secondary | ICD-10-CM | POA: Diagnosis not present

## 2017-02-25 DIAGNOSIS — Z299 Encounter for prophylactic measures, unspecified: Secondary | ICD-10-CM | POA: Diagnosis not present

## 2017-02-25 DIAGNOSIS — N183 Chronic kidney disease, stage 3 (moderate): Secondary | ICD-10-CM | POA: Diagnosis not present

## 2017-02-25 DIAGNOSIS — N400 Enlarged prostate without lower urinary tract symptoms: Secondary | ICD-10-CM | POA: Diagnosis not present

## 2017-02-25 DIAGNOSIS — E782 Mixed hyperlipidemia: Secondary | ICD-10-CM | POA: Diagnosis not present

## 2017-02-25 DIAGNOSIS — E1142 Type 2 diabetes mellitus with diabetic polyneuropathy: Secondary | ICD-10-CM | POA: Diagnosis not present

## 2017-02-25 DIAGNOSIS — Z6834 Body mass index (BMI) 34.0-34.9, adult: Secondary | ICD-10-CM | POA: Diagnosis not present

## 2017-02-25 DIAGNOSIS — C61 Malignant neoplasm of prostate: Secondary | ICD-10-CM | POA: Diagnosis not present

## 2017-02-25 DIAGNOSIS — S88112S Complete traumatic amputation at level between knee and ankle, left lower leg, sequela: Secondary | ICD-10-CM | POA: Diagnosis not present

## 2017-02-25 DIAGNOSIS — E1122 Type 2 diabetes mellitus with diabetic chronic kidney disease: Secondary | ICD-10-CM | POA: Diagnosis not present

## 2017-02-25 DIAGNOSIS — E1165 Type 2 diabetes mellitus with hyperglycemia: Secondary | ICD-10-CM | POA: Diagnosis not present

## 2017-02-25 DIAGNOSIS — I4892 Unspecified atrial flutter: Secondary | ICD-10-CM | POA: Diagnosis not present

## 2017-03-03 DIAGNOSIS — E1140 Type 2 diabetes mellitus with diabetic neuropathy, unspecified: Secondary | ICD-10-CM | POA: Diagnosis not present

## 2017-03-03 DIAGNOSIS — L89892 Pressure ulcer of other site, stage 2: Secondary | ICD-10-CM | POA: Diagnosis not present

## 2017-03-03 DIAGNOSIS — E1151 Type 2 diabetes mellitus with diabetic peripheral angiopathy without gangrene: Secondary | ICD-10-CM | POA: Diagnosis not present

## 2017-03-10 DIAGNOSIS — L89892 Pressure ulcer of other site, stage 2: Secondary | ICD-10-CM | POA: Diagnosis not present

## 2017-03-10 DIAGNOSIS — E1151 Type 2 diabetes mellitus with diabetic peripheral angiopathy without gangrene: Secondary | ICD-10-CM | POA: Diagnosis not present

## 2017-03-10 DIAGNOSIS — E1140 Type 2 diabetes mellitus with diabetic neuropathy, unspecified: Secondary | ICD-10-CM | POA: Diagnosis not present

## 2017-03-23 DIAGNOSIS — E1140 Type 2 diabetes mellitus with diabetic neuropathy, unspecified: Secondary | ICD-10-CM | POA: Diagnosis not present

## 2017-03-23 DIAGNOSIS — E1151 Type 2 diabetes mellitus with diabetic peripheral angiopathy without gangrene: Secondary | ICD-10-CM | POA: Diagnosis not present

## 2017-03-23 DIAGNOSIS — L89892 Pressure ulcer of other site, stage 2: Secondary | ICD-10-CM | POA: Diagnosis not present

## 2017-04-07 DIAGNOSIS — E1151 Type 2 diabetes mellitus with diabetic peripheral angiopathy without gangrene: Secondary | ICD-10-CM | POA: Diagnosis not present

## 2017-04-07 DIAGNOSIS — E1140 Type 2 diabetes mellitus with diabetic neuropathy, unspecified: Secondary | ICD-10-CM | POA: Diagnosis not present

## 2017-04-07 DIAGNOSIS — L89892 Pressure ulcer of other site, stage 2: Secondary | ICD-10-CM | POA: Diagnosis not present

## 2017-04-28 DIAGNOSIS — E1151 Type 2 diabetes mellitus with diabetic peripheral angiopathy without gangrene: Secondary | ICD-10-CM | POA: Diagnosis not present

## 2017-04-28 DIAGNOSIS — L89892 Pressure ulcer of other site, stage 2: Secondary | ICD-10-CM | POA: Diagnosis not present

## 2017-04-28 DIAGNOSIS — E1140 Type 2 diabetes mellitus with diabetic neuropathy, unspecified: Secondary | ICD-10-CM | POA: Diagnosis not present

## 2017-05-19 DIAGNOSIS — E1151 Type 2 diabetes mellitus with diabetic peripheral angiopathy without gangrene: Secondary | ICD-10-CM | POA: Diagnosis not present

## 2017-05-19 DIAGNOSIS — E1140 Type 2 diabetes mellitus with diabetic neuropathy, unspecified: Secondary | ICD-10-CM | POA: Diagnosis not present

## 2017-05-19 DIAGNOSIS — L89892 Pressure ulcer of other site, stage 2: Secondary | ICD-10-CM | POA: Diagnosis not present

## 2017-06-09 DIAGNOSIS — E1122 Type 2 diabetes mellitus with diabetic chronic kidney disease: Secondary | ICD-10-CM | POA: Diagnosis not present

## 2017-06-09 DIAGNOSIS — N183 Chronic kidney disease, stage 3 (moderate): Secondary | ICD-10-CM | POA: Diagnosis not present

## 2017-06-09 DIAGNOSIS — I10 Essential (primary) hypertension: Secondary | ICD-10-CM | POA: Diagnosis not present

## 2017-06-09 DIAGNOSIS — Z6837 Body mass index (BMI) 37.0-37.9, adult: Secondary | ICD-10-CM | POA: Diagnosis not present

## 2017-06-09 DIAGNOSIS — E7800 Pure hypercholesterolemia, unspecified: Secondary | ICD-10-CM | POA: Diagnosis not present

## 2017-06-09 DIAGNOSIS — C61 Malignant neoplasm of prostate: Secondary | ICD-10-CM | POA: Diagnosis not present

## 2017-06-09 DIAGNOSIS — E1165 Type 2 diabetes mellitus with hyperglycemia: Secondary | ICD-10-CM | POA: Diagnosis not present

## 2017-06-09 DIAGNOSIS — F419 Anxiety disorder, unspecified: Secondary | ICD-10-CM | POA: Diagnosis not present

## 2017-06-09 DIAGNOSIS — E1142 Type 2 diabetes mellitus with diabetic polyneuropathy: Secondary | ICD-10-CM | POA: Diagnosis not present

## 2017-06-09 DIAGNOSIS — I4892 Unspecified atrial flutter: Secondary | ICD-10-CM | POA: Diagnosis not present

## 2017-06-09 DIAGNOSIS — Z89512 Acquired absence of left leg below knee: Secondary | ICD-10-CM | POA: Diagnosis not present

## 2017-06-09 DIAGNOSIS — Z299 Encounter for prophylactic measures, unspecified: Secondary | ICD-10-CM | POA: Diagnosis not present

## 2017-06-10 DIAGNOSIS — E119 Type 2 diabetes mellitus without complications: Secondary | ICD-10-CM | POA: Diagnosis not present

## 2017-06-10 DIAGNOSIS — E7800 Pure hypercholesterolemia, unspecified: Secondary | ICD-10-CM | POA: Diagnosis not present

## 2017-06-16 DIAGNOSIS — E1151 Type 2 diabetes mellitus with diabetic peripheral angiopathy without gangrene: Secondary | ICD-10-CM | POA: Diagnosis not present

## 2017-06-16 DIAGNOSIS — L89892 Pressure ulcer of other site, stage 2: Secondary | ICD-10-CM | POA: Diagnosis not present

## 2017-06-16 DIAGNOSIS — E1140 Type 2 diabetes mellitus with diabetic neuropathy, unspecified: Secondary | ICD-10-CM | POA: Diagnosis not present

## 2017-07-02 DIAGNOSIS — Z7189 Other specified counseling: Secondary | ICD-10-CM | POA: Diagnosis not present

## 2017-07-02 DIAGNOSIS — E1122 Type 2 diabetes mellitus with diabetic chronic kidney disease: Secondary | ICD-10-CM | POA: Diagnosis not present

## 2017-07-02 DIAGNOSIS — E1165 Type 2 diabetes mellitus with hyperglycemia: Secondary | ICD-10-CM | POA: Diagnosis not present

## 2017-07-02 DIAGNOSIS — Z0000 Encounter for general adult medical examination without abnormal findings: Secondary | ICD-10-CM | POA: Diagnosis not present

## 2017-07-02 DIAGNOSIS — R5383 Other fatigue: Secondary | ICD-10-CM | POA: Diagnosis not present

## 2017-07-02 DIAGNOSIS — C61 Malignant neoplasm of prostate: Secondary | ICD-10-CM | POA: Diagnosis not present

## 2017-07-02 DIAGNOSIS — Z79899 Other long term (current) drug therapy: Secondary | ICD-10-CM | POA: Diagnosis not present

## 2017-07-02 DIAGNOSIS — N183 Chronic kidney disease, stage 3 (moderate): Secondary | ICD-10-CM | POA: Diagnosis not present

## 2017-07-02 DIAGNOSIS — I4892 Unspecified atrial flutter: Secondary | ICD-10-CM | POA: Diagnosis not present

## 2017-07-02 DIAGNOSIS — Z299 Encounter for prophylactic measures, unspecified: Secondary | ICD-10-CM | POA: Diagnosis not present

## 2017-07-02 DIAGNOSIS — Z1211 Encounter for screening for malignant neoplasm of colon: Secondary | ICD-10-CM | POA: Diagnosis not present

## 2017-07-02 DIAGNOSIS — E1142 Type 2 diabetes mellitus with diabetic polyneuropathy: Secondary | ICD-10-CM | POA: Diagnosis not present

## 2017-07-02 DIAGNOSIS — E782 Mixed hyperlipidemia: Secondary | ICD-10-CM | POA: Diagnosis not present

## 2017-07-02 DIAGNOSIS — Z1389 Encounter for screening for other disorder: Secondary | ICD-10-CM | POA: Diagnosis not present

## 2017-07-02 DIAGNOSIS — Z6838 Body mass index (BMI) 38.0-38.9, adult: Secondary | ICD-10-CM | POA: Diagnosis not present

## 2017-07-28 DIAGNOSIS — Z23 Encounter for immunization: Secondary | ICD-10-CM | POA: Diagnosis not present

## 2017-10-05 DIAGNOSIS — F419 Anxiety disorder, unspecified: Secondary | ICD-10-CM | POA: Diagnosis present

## 2017-10-05 DIAGNOSIS — Z7984 Long term (current) use of oral hypoglycemic drugs: Secondary | ICD-10-CM | POA: Diagnosis not present

## 2017-10-05 DIAGNOSIS — Z79899 Other long term (current) drug therapy: Secondary | ICD-10-CM | POA: Diagnosis not present

## 2017-10-05 DIAGNOSIS — M79671 Pain in right foot: Secondary | ICD-10-CM | POA: Diagnosis not present

## 2017-10-05 DIAGNOSIS — L89892 Pressure ulcer of other site, stage 2: Secondary | ICD-10-CM | POA: Diagnosis not present

## 2017-10-05 DIAGNOSIS — E1151 Type 2 diabetes mellitus with diabetic peripheral angiopathy without gangrene: Secondary | ICD-10-CM | POA: Diagnosis not present

## 2017-10-05 DIAGNOSIS — E785 Hyperlipidemia, unspecified: Secondary | ICD-10-CM | POA: Diagnosis present

## 2017-10-05 DIAGNOSIS — L97519 Non-pressure chronic ulcer of other part of right foot with unspecified severity: Secondary | ICD-10-CM | POA: Diagnosis not present

## 2017-10-05 DIAGNOSIS — L02611 Cutaneous abscess of right foot: Secondary | ICD-10-CM | POA: Diagnosis not present

## 2017-10-05 DIAGNOSIS — G4730 Sleep apnea, unspecified: Secondary | ICD-10-CM | POA: Diagnosis present

## 2017-10-05 DIAGNOSIS — E1140 Type 2 diabetes mellitus with diabetic neuropathy, unspecified: Secondary | ICD-10-CM | POA: Diagnosis not present

## 2017-10-05 DIAGNOSIS — L03115 Cellulitis of right lower limb: Secondary | ICD-10-CM | POA: Diagnosis not present

## 2017-10-05 DIAGNOSIS — L089 Local infection of the skin and subcutaneous tissue, unspecified: Secondary | ICD-10-CM | POA: Diagnosis not present

## 2017-10-05 DIAGNOSIS — E11628 Type 2 diabetes mellitus with other skin complications: Secondary | ICD-10-CM | POA: Diagnosis not present

## 2017-10-05 DIAGNOSIS — F329 Major depressive disorder, single episode, unspecified: Secondary | ICD-10-CM | POA: Diagnosis present

## 2017-10-05 DIAGNOSIS — L03031 Cellulitis of right toe: Secondary | ICD-10-CM | POA: Diagnosis not present

## 2017-10-05 DIAGNOSIS — E11621 Type 2 diabetes mellitus with foot ulcer: Secondary | ICD-10-CM | POA: Diagnosis not present

## 2017-10-05 DIAGNOSIS — M7989 Other specified soft tissue disorders: Secondary | ICD-10-CM | POA: Diagnosis not present

## 2017-10-05 DIAGNOSIS — Z8546 Personal history of malignant neoplasm of prostate: Secondary | ICD-10-CM | POA: Diagnosis not present

## 2017-10-05 DIAGNOSIS — Z89512 Acquired absence of left leg below knee: Secondary | ICD-10-CM | POA: Diagnosis not present

## 2017-10-05 DIAGNOSIS — Z8614 Personal history of Methicillin resistant Staphylococcus aureus infection: Secondary | ICD-10-CM | POA: Diagnosis not present

## 2017-10-05 DIAGNOSIS — I129 Hypertensive chronic kidney disease with stage 1 through stage 4 chronic kidney disease, or unspecified chronic kidney disease: Secondary | ICD-10-CM | POA: Diagnosis present

## 2017-10-05 DIAGNOSIS — E1122 Type 2 diabetes mellitus with diabetic chronic kidney disease: Secondary | ICD-10-CM | POA: Diagnosis present

## 2017-10-05 DIAGNOSIS — N189 Chronic kidney disease, unspecified: Secondary | ICD-10-CM | POA: Diagnosis present

## 2017-10-11 DIAGNOSIS — I120 Hypertensive chronic kidney disease with stage 5 chronic kidney disease or end stage renal disease: Secondary | ICD-10-CM | POA: Diagnosis not present

## 2017-10-11 DIAGNOSIS — F329 Major depressive disorder, single episode, unspecified: Secondary | ICD-10-CM | POA: Diagnosis not present

## 2017-10-11 DIAGNOSIS — E1122 Type 2 diabetes mellitus with diabetic chronic kidney disease: Secondary | ICD-10-CM | POA: Diagnosis not present

## 2017-10-11 DIAGNOSIS — F419 Anxiety disorder, unspecified: Secondary | ICD-10-CM | POA: Diagnosis not present

## 2017-10-11 DIAGNOSIS — E1142 Type 2 diabetes mellitus with diabetic polyneuropathy: Secondary | ICD-10-CM | POA: Diagnosis not present

## 2017-10-11 DIAGNOSIS — Z89512 Acquired absence of left leg below knee: Secondary | ICD-10-CM | POA: Diagnosis not present

## 2017-10-11 DIAGNOSIS — N189 Chronic kidney disease, unspecified: Secondary | ICD-10-CM | POA: Diagnosis not present

## 2017-10-11 DIAGNOSIS — Z794 Long term (current) use of insulin: Secondary | ICD-10-CM | POA: Diagnosis not present

## 2017-10-11 DIAGNOSIS — L97522 Non-pressure chronic ulcer of other part of left foot with fat layer exposed: Secondary | ICD-10-CM | POA: Diagnosis not present

## 2017-10-11 DIAGNOSIS — E11621 Type 2 diabetes mellitus with foot ulcer: Secondary | ICD-10-CM | POA: Diagnosis not present

## 2017-10-11 DIAGNOSIS — L03115 Cellulitis of right lower limb: Secondary | ICD-10-CM | POA: Diagnosis not present

## 2017-10-14 DIAGNOSIS — E1122 Type 2 diabetes mellitus with diabetic chronic kidney disease: Secondary | ICD-10-CM | POA: Diagnosis not present

## 2017-10-14 DIAGNOSIS — I120 Hypertensive chronic kidney disease with stage 5 chronic kidney disease or end stage renal disease: Secondary | ICD-10-CM | POA: Diagnosis not present

## 2017-10-14 DIAGNOSIS — L97522 Non-pressure chronic ulcer of other part of left foot with fat layer exposed: Secondary | ICD-10-CM | POA: Diagnosis not present

## 2017-10-14 DIAGNOSIS — L03115 Cellulitis of right lower limb: Secondary | ICD-10-CM | POA: Diagnosis not present

## 2017-10-14 DIAGNOSIS — E11621 Type 2 diabetes mellitus with foot ulcer: Secondary | ICD-10-CM | POA: Diagnosis not present

## 2017-10-14 DIAGNOSIS — E1142 Type 2 diabetes mellitus with diabetic polyneuropathy: Secondary | ICD-10-CM | POA: Diagnosis not present

## 2017-10-21 DIAGNOSIS — I120 Hypertensive chronic kidney disease with stage 5 chronic kidney disease or end stage renal disease: Secondary | ICD-10-CM | POA: Diagnosis not present

## 2017-10-21 DIAGNOSIS — E1122 Type 2 diabetes mellitus with diabetic chronic kidney disease: Secondary | ICD-10-CM | POA: Diagnosis not present

## 2017-10-21 DIAGNOSIS — L97522 Non-pressure chronic ulcer of other part of left foot with fat layer exposed: Secondary | ICD-10-CM | POA: Diagnosis not present

## 2017-10-21 DIAGNOSIS — L03115 Cellulitis of right lower limb: Secondary | ICD-10-CM | POA: Diagnosis not present

## 2017-10-21 DIAGNOSIS — E1142 Type 2 diabetes mellitus with diabetic polyneuropathy: Secondary | ICD-10-CM | POA: Diagnosis not present

## 2017-10-21 DIAGNOSIS — E11621 Type 2 diabetes mellitus with foot ulcer: Secondary | ICD-10-CM | POA: Diagnosis not present

## 2017-10-22 DIAGNOSIS — E782 Mixed hyperlipidemia: Secondary | ICD-10-CM | POA: Diagnosis not present

## 2017-10-22 DIAGNOSIS — I10 Essential (primary) hypertension: Secondary | ICD-10-CM | POA: Diagnosis not present

## 2017-10-22 DIAGNOSIS — E1165 Type 2 diabetes mellitus with hyperglycemia: Secondary | ICD-10-CM | POA: Diagnosis not present

## 2017-10-22 DIAGNOSIS — E1122 Type 2 diabetes mellitus with diabetic chronic kidney disease: Secondary | ICD-10-CM | POA: Diagnosis not present

## 2017-10-22 DIAGNOSIS — L97509 Non-pressure chronic ulcer of other part of unspecified foot with unspecified severity: Secondary | ICD-10-CM | POA: Diagnosis not present

## 2017-10-22 DIAGNOSIS — I4892 Unspecified atrial flutter: Secondary | ICD-10-CM | POA: Diagnosis not present

## 2017-10-22 DIAGNOSIS — N183 Chronic kidney disease, stage 3 (moderate): Secondary | ICD-10-CM | POA: Diagnosis not present

## 2017-10-22 DIAGNOSIS — Z299 Encounter for prophylactic measures, unspecified: Secondary | ICD-10-CM | POA: Diagnosis not present

## 2017-10-22 DIAGNOSIS — Z789 Other specified health status: Secondary | ICD-10-CM | POA: Diagnosis not present

## 2017-10-22 DIAGNOSIS — Z6838 Body mass index (BMI) 38.0-38.9, adult: Secondary | ICD-10-CM | POA: Diagnosis not present

## 2017-10-22 DIAGNOSIS — E08621 Diabetes mellitus due to underlying condition with foot ulcer: Secondary | ICD-10-CM | POA: Diagnosis not present

## 2017-10-23 DIAGNOSIS — E1122 Type 2 diabetes mellitus with diabetic chronic kidney disease: Secondary | ICD-10-CM | POA: Diagnosis not present

## 2017-10-23 DIAGNOSIS — E11621 Type 2 diabetes mellitus with foot ulcer: Secondary | ICD-10-CM | POA: Diagnosis not present

## 2017-10-23 DIAGNOSIS — L03115 Cellulitis of right lower limb: Secondary | ICD-10-CM | POA: Diagnosis not present

## 2017-10-23 DIAGNOSIS — L97522 Non-pressure chronic ulcer of other part of left foot with fat layer exposed: Secondary | ICD-10-CM | POA: Diagnosis not present

## 2017-10-23 DIAGNOSIS — E1142 Type 2 diabetes mellitus with diabetic polyneuropathy: Secondary | ICD-10-CM | POA: Diagnosis not present

## 2017-10-23 DIAGNOSIS — I120 Hypertensive chronic kidney disease with stage 5 chronic kidney disease or end stage renal disease: Secondary | ICD-10-CM | POA: Diagnosis not present

## 2017-10-26 DIAGNOSIS — L97412 Non-pressure chronic ulcer of right heel and midfoot with fat layer exposed: Secondary | ICD-10-CM | POA: Diagnosis not present

## 2017-10-26 DIAGNOSIS — E11621 Type 2 diabetes mellitus with foot ulcer: Secondary | ICD-10-CM | POA: Diagnosis not present

## 2017-10-27 DIAGNOSIS — L97412 Non-pressure chronic ulcer of right heel and midfoot with fat layer exposed: Secondary | ICD-10-CM | POA: Insufficient documentation

## 2017-10-27 DIAGNOSIS — E11621 Type 2 diabetes mellitus with foot ulcer: Secondary | ICD-10-CM | POA: Insufficient documentation

## 2017-10-28 DIAGNOSIS — L97522 Non-pressure chronic ulcer of other part of left foot with fat layer exposed: Secondary | ICD-10-CM | POA: Diagnosis not present

## 2017-10-28 DIAGNOSIS — E1122 Type 2 diabetes mellitus with diabetic chronic kidney disease: Secondary | ICD-10-CM | POA: Diagnosis not present

## 2017-10-28 DIAGNOSIS — L03115 Cellulitis of right lower limb: Secondary | ICD-10-CM | POA: Diagnosis not present

## 2017-10-28 DIAGNOSIS — I120 Hypertensive chronic kidney disease with stage 5 chronic kidney disease or end stage renal disease: Secondary | ICD-10-CM | POA: Diagnosis not present

## 2017-10-28 DIAGNOSIS — E1142 Type 2 diabetes mellitus with diabetic polyneuropathy: Secondary | ICD-10-CM | POA: Diagnosis not present

## 2017-10-28 DIAGNOSIS — E11621 Type 2 diabetes mellitus with foot ulcer: Secondary | ICD-10-CM | POA: Diagnosis not present

## 2017-10-30 DIAGNOSIS — E1142 Type 2 diabetes mellitus with diabetic polyneuropathy: Secondary | ICD-10-CM | POA: Diagnosis not present

## 2017-10-30 DIAGNOSIS — I120 Hypertensive chronic kidney disease with stage 5 chronic kidney disease or end stage renal disease: Secondary | ICD-10-CM | POA: Diagnosis not present

## 2017-10-30 DIAGNOSIS — E11621 Type 2 diabetes mellitus with foot ulcer: Secondary | ICD-10-CM | POA: Diagnosis not present

## 2017-10-30 DIAGNOSIS — E1122 Type 2 diabetes mellitus with diabetic chronic kidney disease: Secondary | ICD-10-CM | POA: Diagnosis not present

## 2017-10-30 DIAGNOSIS — L03115 Cellulitis of right lower limb: Secondary | ICD-10-CM | POA: Diagnosis not present

## 2017-10-30 DIAGNOSIS — L97522 Non-pressure chronic ulcer of other part of left foot with fat layer exposed: Secondary | ICD-10-CM | POA: Diagnosis not present

## 2017-11-02 DIAGNOSIS — I120 Hypertensive chronic kidney disease with stage 5 chronic kidney disease or end stage renal disease: Secondary | ICD-10-CM | POA: Diagnosis not present

## 2017-11-02 DIAGNOSIS — L97522 Non-pressure chronic ulcer of other part of left foot with fat layer exposed: Secondary | ICD-10-CM | POA: Diagnosis not present

## 2017-11-02 DIAGNOSIS — E1122 Type 2 diabetes mellitus with diabetic chronic kidney disease: Secondary | ICD-10-CM | POA: Diagnosis not present

## 2017-11-02 DIAGNOSIS — L03115 Cellulitis of right lower limb: Secondary | ICD-10-CM | POA: Diagnosis not present

## 2017-11-02 DIAGNOSIS — E1142 Type 2 diabetes mellitus with diabetic polyneuropathy: Secondary | ICD-10-CM | POA: Diagnosis not present

## 2017-11-02 DIAGNOSIS — E11621 Type 2 diabetes mellitus with foot ulcer: Secondary | ICD-10-CM | POA: Diagnosis not present

## 2017-11-04 DIAGNOSIS — L97522 Non-pressure chronic ulcer of other part of left foot with fat layer exposed: Secondary | ICD-10-CM | POA: Diagnosis not present

## 2017-11-04 DIAGNOSIS — E1122 Type 2 diabetes mellitus with diabetic chronic kidney disease: Secondary | ICD-10-CM | POA: Diagnosis not present

## 2017-11-04 DIAGNOSIS — E11621 Type 2 diabetes mellitus with foot ulcer: Secondary | ICD-10-CM | POA: Diagnosis not present

## 2017-11-04 DIAGNOSIS — I120 Hypertensive chronic kidney disease with stage 5 chronic kidney disease or end stage renal disease: Secondary | ICD-10-CM | POA: Diagnosis not present

## 2017-11-04 DIAGNOSIS — E1142 Type 2 diabetes mellitus with diabetic polyneuropathy: Secondary | ICD-10-CM | POA: Diagnosis not present

## 2017-11-04 DIAGNOSIS — L03115 Cellulitis of right lower limb: Secondary | ICD-10-CM | POA: Diagnosis not present

## 2017-11-06 DIAGNOSIS — L97522 Non-pressure chronic ulcer of other part of left foot with fat layer exposed: Secondary | ICD-10-CM | POA: Diagnosis not present

## 2017-11-06 DIAGNOSIS — E11621 Type 2 diabetes mellitus with foot ulcer: Secondary | ICD-10-CM | POA: Diagnosis not present

## 2017-11-06 DIAGNOSIS — E1122 Type 2 diabetes mellitus with diabetic chronic kidney disease: Secondary | ICD-10-CM | POA: Diagnosis not present

## 2017-11-06 DIAGNOSIS — E1142 Type 2 diabetes mellitus with diabetic polyneuropathy: Secondary | ICD-10-CM | POA: Diagnosis not present

## 2017-11-06 DIAGNOSIS — I120 Hypertensive chronic kidney disease with stage 5 chronic kidney disease or end stage renal disease: Secondary | ICD-10-CM | POA: Diagnosis not present

## 2017-11-06 DIAGNOSIS — L03115 Cellulitis of right lower limb: Secondary | ICD-10-CM | POA: Diagnosis not present

## 2017-11-09 DIAGNOSIS — E1142 Type 2 diabetes mellitus with diabetic polyneuropathy: Secondary | ICD-10-CM | POA: Diagnosis not present

## 2017-11-09 DIAGNOSIS — I120 Hypertensive chronic kidney disease with stage 5 chronic kidney disease or end stage renal disease: Secondary | ICD-10-CM | POA: Diagnosis not present

## 2017-11-09 DIAGNOSIS — E11621 Type 2 diabetes mellitus with foot ulcer: Secondary | ICD-10-CM | POA: Diagnosis not present

## 2017-11-09 DIAGNOSIS — E1122 Type 2 diabetes mellitus with diabetic chronic kidney disease: Secondary | ICD-10-CM | POA: Diagnosis not present

## 2017-11-09 DIAGNOSIS — L97522 Non-pressure chronic ulcer of other part of left foot with fat layer exposed: Secondary | ICD-10-CM | POA: Diagnosis not present

## 2017-11-09 DIAGNOSIS — L03115 Cellulitis of right lower limb: Secondary | ICD-10-CM | POA: Diagnosis not present

## 2017-11-13 DIAGNOSIS — L97522 Non-pressure chronic ulcer of other part of left foot with fat layer exposed: Secondary | ICD-10-CM | POA: Diagnosis not present

## 2017-11-13 DIAGNOSIS — L03115 Cellulitis of right lower limb: Secondary | ICD-10-CM | POA: Diagnosis not present

## 2017-11-13 DIAGNOSIS — E1122 Type 2 diabetes mellitus with diabetic chronic kidney disease: Secondary | ICD-10-CM | POA: Diagnosis not present

## 2017-11-13 DIAGNOSIS — E1142 Type 2 diabetes mellitus with diabetic polyneuropathy: Secondary | ICD-10-CM | POA: Diagnosis not present

## 2017-11-13 DIAGNOSIS — E11621 Type 2 diabetes mellitus with foot ulcer: Secondary | ICD-10-CM | POA: Diagnosis not present

## 2017-11-13 DIAGNOSIS — I120 Hypertensive chronic kidney disease with stage 5 chronic kidney disease or end stage renal disease: Secondary | ICD-10-CM | POA: Diagnosis not present

## 2017-11-16 DIAGNOSIS — L03115 Cellulitis of right lower limb: Secondary | ICD-10-CM | POA: Diagnosis not present

## 2017-11-16 DIAGNOSIS — E1122 Type 2 diabetes mellitus with diabetic chronic kidney disease: Secondary | ICD-10-CM | POA: Diagnosis not present

## 2017-11-16 DIAGNOSIS — L97522 Non-pressure chronic ulcer of other part of left foot with fat layer exposed: Secondary | ICD-10-CM | POA: Diagnosis not present

## 2017-11-16 DIAGNOSIS — I120 Hypertensive chronic kidney disease with stage 5 chronic kidney disease or end stage renal disease: Secondary | ICD-10-CM | POA: Diagnosis not present

## 2017-11-16 DIAGNOSIS — E11621 Type 2 diabetes mellitus with foot ulcer: Secondary | ICD-10-CM | POA: Diagnosis not present

## 2017-11-16 DIAGNOSIS — E1142 Type 2 diabetes mellitus with diabetic polyneuropathy: Secondary | ICD-10-CM | POA: Diagnosis not present

## 2017-11-18 DIAGNOSIS — I120 Hypertensive chronic kidney disease with stage 5 chronic kidney disease or end stage renal disease: Secondary | ICD-10-CM | POA: Diagnosis not present

## 2017-11-18 DIAGNOSIS — L97522 Non-pressure chronic ulcer of other part of left foot with fat layer exposed: Secondary | ICD-10-CM | POA: Diagnosis not present

## 2017-11-18 DIAGNOSIS — E1142 Type 2 diabetes mellitus with diabetic polyneuropathy: Secondary | ICD-10-CM | POA: Diagnosis not present

## 2017-11-18 DIAGNOSIS — E1122 Type 2 diabetes mellitus with diabetic chronic kidney disease: Secondary | ICD-10-CM | POA: Diagnosis not present

## 2017-11-18 DIAGNOSIS — L03115 Cellulitis of right lower limb: Secondary | ICD-10-CM | POA: Diagnosis not present

## 2017-11-18 DIAGNOSIS — E11621 Type 2 diabetes mellitus with foot ulcer: Secondary | ICD-10-CM | POA: Diagnosis not present

## 2017-11-19 DIAGNOSIS — E119 Type 2 diabetes mellitus without complications: Secondary | ICD-10-CM | POA: Diagnosis not present

## 2017-11-19 DIAGNOSIS — E7800 Pure hypercholesterolemia, unspecified: Secondary | ICD-10-CM | POA: Diagnosis not present

## 2017-11-20 DIAGNOSIS — L97522 Non-pressure chronic ulcer of other part of left foot with fat layer exposed: Secondary | ICD-10-CM | POA: Diagnosis not present

## 2017-11-20 DIAGNOSIS — L03115 Cellulitis of right lower limb: Secondary | ICD-10-CM | POA: Diagnosis not present

## 2017-11-20 DIAGNOSIS — E1142 Type 2 diabetes mellitus with diabetic polyneuropathy: Secondary | ICD-10-CM | POA: Diagnosis not present

## 2017-11-20 DIAGNOSIS — E11621 Type 2 diabetes mellitus with foot ulcer: Secondary | ICD-10-CM | POA: Diagnosis not present

## 2017-11-20 DIAGNOSIS — E1122 Type 2 diabetes mellitus with diabetic chronic kidney disease: Secondary | ICD-10-CM | POA: Diagnosis not present

## 2017-11-20 DIAGNOSIS — I120 Hypertensive chronic kidney disease with stage 5 chronic kidney disease or end stage renal disease: Secondary | ICD-10-CM | POA: Diagnosis not present

## 2017-11-23 DIAGNOSIS — E11621 Type 2 diabetes mellitus with foot ulcer: Secondary | ICD-10-CM | POA: Diagnosis not present

## 2017-11-23 DIAGNOSIS — E1122 Type 2 diabetes mellitus with diabetic chronic kidney disease: Secondary | ICD-10-CM | POA: Diagnosis not present

## 2017-11-23 DIAGNOSIS — L03115 Cellulitis of right lower limb: Secondary | ICD-10-CM | POA: Diagnosis not present

## 2017-11-23 DIAGNOSIS — L97522 Non-pressure chronic ulcer of other part of left foot with fat layer exposed: Secondary | ICD-10-CM | POA: Diagnosis not present

## 2017-11-23 DIAGNOSIS — E1142 Type 2 diabetes mellitus with diabetic polyneuropathy: Secondary | ICD-10-CM | POA: Diagnosis not present

## 2017-11-23 DIAGNOSIS — I120 Hypertensive chronic kidney disease with stage 5 chronic kidney disease or end stage renal disease: Secondary | ICD-10-CM | POA: Diagnosis not present

## 2017-11-25 DIAGNOSIS — L97412 Non-pressure chronic ulcer of right heel and midfoot with fat layer exposed: Secondary | ICD-10-CM | POA: Diagnosis not present

## 2017-11-25 DIAGNOSIS — E11621 Type 2 diabetes mellitus with foot ulcer: Secondary | ICD-10-CM | POA: Diagnosis not present

## 2017-11-27 DIAGNOSIS — E1142 Type 2 diabetes mellitus with diabetic polyneuropathy: Secondary | ICD-10-CM | POA: Diagnosis not present

## 2017-11-27 DIAGNOSIS — E1122 Type 2 diabetes mellitus with diabetic chronic kidney disease: Secondary | ICD-10-CM | POA: Diagnosis not present

## 2017-11-27 DIAGNOSIS — I120 Hypertensive chronic kidney disease with stage 5 chronic kidney disease or end stage renal disease: Secondary | ICD-10-CM | POA: Diagnosis not present

## 2017-11-27 DIAGNOSIS — L03115 Cellulitis of right lower limb: Secondary | ICD-10-CM | POA: Diagnosis not present

## 2017-11-27 DIAGNOSIS — E11621 Type 2 diabetes mellitus with foot ulcer: Secondary | ICD-10-CM | POA: Diagnosis not present

## 2017-11-27 DIAGNOSIS — L97522 Non-pressure chronic ulcer of other part of left foot with fat layer exposed: Secondary | ICD-10-CM | POA: Diagnosis not present

## 2017-11-30 DIAGNOSIS — L97522 Non-pressure chronic ulcer of other part of left foot with fat layer exposed: Secondary | ICD-10-CM | POA: Diagnosis not present

## 2017-11-30 DIAGNOSIS — I120 Hypertensive chronic kidney disease with stage 5 chronic kidney disease or end stage renal disease: Secondary | ICD-10-CM | POA: Diagnosis not present

## 2017-11-30 DIAGNOSIS — E11621 Type 2 diabetes mellitus with foot ulcer: Secondary | ICD-10-CM | POA: Diagnosis not present

## 2017-11-30 DIAGNOSIS — E1142 Type 2 diabetes mellitus with diabetic polyneuropathy: Secondary | ICD-10-CM | POA: Diagnosis not present

## 2017-11-30 DIAGNOSIS — L03115 Cellulitis of right lower limb: Secondary | ICD-10-CM | POA: Diagnosis not present

## 2017-11-30 DIAGNOSIS — E1122 Type 2 diabetes mellitus with diabetic chronic kidney disease: Secondary | ICD-10-CM | POA: Diagnosis not present

## 2017-12-02 DIAGNOSIS — L03115 Cellulitis of right lower limb: Secondary | ICD-10-CM | POA: Diagnosis not present

## 2017-12-02 DIAGNOSIS — E1142 Type 2 diabetes mellitus with diabetic polyneuropathy: Secondary | ICD-10-CM | POA: Diagnosis not present

## 2017-12-02 DIAGNOSIS — E11621 Type 2 diabetes mellitus with foot ulcer: Secondary | ICD-10-CM | POA: Diagnosis not present

## 2017-12-02 DIAGNOSIS — L97522 Non-pressure chronic ulcer of other part of left foot with fat layer exposed: Secondary | ICD-10-CM | POA: Diagnosis not present

## 2017-12-02 DIAGNOSIS — I120 Hypertensive chronic kidney disease with stage 5 chronic kidney disease or end stage renal disease: Secondary | ICD-10-CM | POA: Diagnosis not present

## 2017-12-02 DIAGNOSIS — E1122 Type 2 diabetes mellitus with diabetic chronic kidney disease: Secondary | ICD-10-CM | POA: Diagnosis not present

## 2017-12-04 DIAGNOSIS — E1142 Type 2 diabetes mellitus with diabetic polyneuropathy: Secondary | ICD-10-CM | POA: Diagnosis not present

## 2017-12-04 DIAGNOSIS — L03115 Cellulitis of right lower limb: Secondary | ICD-10-CM | POA: Diagnosis not present

## 2017-12-04 DIAGNOSIS — L97522 Non-pressure chronic ulcer of other part of left foot with fat layer exposed: Secondary | ICD-10-CM | POA: Diagnosis not present

## 2017-12-04 DIAGNOSIS — I120 Hypertensive chronic kidney disease with stage 5 chronic kidney disease or end stage renal disease: Secondary | ICD-10-CM | POA: Diagnosis not present

## 2017-12-04 DIAGNOSIS — E11621 Type 2 diabetes mellitus with foot ulcer: Secondary | ICD-10-CM | POA: Diagnosis not present

## 2017-12-04 DIAGNOSIS — E1122 Type 2 diabetes mellitus with diabetic chronic kidney disease: Secondary | ICD-10-CM | POA: Diagnosis not present

## 2017-12-07 DIAGNOSIS — E11621 Type 2 diabetes mellitus with foot ulcer: Secondary | ICD-10-CM | POA: Diagnosis not present

## 2017-12-07 DIAGNOSIS — E1142 Type 2 diabetes mellitus with diabetic polyneuropathy: Secondary | ICD-10-CM | POA: Diagnosis not present

## 2017-12-07 DIAGNOSIS — I120 Hypertensive chronic kidney disease with stage 5 chronic kidney disease or end stage renal disease: Secondary | ICD-10-CM | POA: Diagnosis not present

## 2017-12-07 DIAGNOSIS — L03115 Cellulitis of right lower limb: Secondary | ICD-10-CM | POA: Diagnosis not present

## 2017-12-07 DIAGNOSIS — E1122 Type 2 diabetes mellitus with diabetic chronic kidney disease: Secondary | ICD-10-CM | POA: Diagnosis not present

## 2017-12-07 DIAGNOSIS — L97522 Non-pressure chronic ulcer of other part of left foot with fat layer exposed: Secondary | ICD-10-CM | POA: Diagnosis not present

## 2017-12-09 DIAGNOSIS — L03115 Cellulitis of right lower limb: Secondary | ICD-10-CM | POA: Diagnosis not present

## 2017-12-09 DIAGNOSIS — I120 Hypertensive chronic kidney disease with stage 5 chronic kidney disease or end stage renal disease: Secondary | ICD-10-CM | POA: Diagnosis not present

## 2017-12-09 DIAGNOSIS — E11621 Type 2 diabetes mellitus with foot ulcer: Secondary | ICD-10-CM | POA: Diagnosis not present

## 2017-12-09 DIAGNOSIS — L97522 Non-pressure chronic ulcer of other part of left foot with fat layer exposed: Secondary | ICD-10-CM | POA: Diagnosis not present

## 2017-12-09 DIAGNOSIS — E1142 Type 2 diabetes mellitus with diabetic polyneuropathy: Secondary | ICD-10-CM | POA: Diagnosis not present

## 2017-12-09 DIAGNOSIS — E1122 Type 2 diabetes mellitus with diabetic chronic kidney disease: Secondary | ICD-10-CM | POA: Diagnosis not present

## 2017-12-10 DIAGNOSIS — I120 Hypertensive chronic kidney disease with stage 5 chronic kidney disease or end stage renal disease: Secondary | ICD-10-CM | POA: Diagnosis not present

## 2017-12-10 DIAGNOSIS — E11621 Type 2 diabetes mellitus with foot ulcer: Secondary | ICD-10-CM | POA: Diagnosis not present

## 2017-12-10 DIAGNOSIS — E1122 Type 2 diabetes mellitus with diabetic chronic kidney disease: Secondary | ICD-10-CM | POA: Diagnosis not present

## 2017-12-10 DIAGNOSIS — F329 Major depressive disorder, single episode, unspecified: Secondary | ICD-10-CM | POA: Diagnosis not present

## 2017-12-10 DIAGNOSIS — F419 Anxiety disorder, unspecified: Secondary | ICD-10-CM | POA: Diagnosis not present

## 2017-12-10 DIAGNOSIS — E1142 Type 2 diabetes mellitus with diabetic polyneuropathy: Secondary | ICD-10-CM | POA: Diagnosis not present

## 2017-12-10 DIAGNOSIS — Z89512 Acquired absence of left leg below knee: Secondary | ICD-10-CM | POA: Diagnosis not present

## 2017-12-10 DIAGNOSIS — N189 Chronic kidney disease, unspecified: Secondary | ICD-10-CM | POA: Diagnosis not present

## 2017-12-10 DIAGNOSIS — Z7984 Long term (current) use of oral hypoglycemic drugs: Secondary | ICD-10-CM | POA: Diagnosis not present

## 2017-12-10 DIAGNOSIS — L97522 Non-pressure chronic ulcer of other part of left foot with fat layer exposed: Secondary | ICD-10-CM | POA: Diagnosis not present

## 2017-12-11 DIAGNOSIS — E1122 Type 2 diabetes mellitus with diabetic chronic kidney disease: Secondary | ICD-10-CM | POA: Diagnosis not present

## 2017-12-11 DIAGNOSIS — E11621 Type 2 diabetes mellitus with foot ulcer: Secondary | ICD-10-CM | POA: Diagnosis not present

## 2017-12-11 DIAGNOSIS — L97522 Non-pressure chronic ulcer of other part of left foot with fat layer exposed: Secondary | ICD-10-CM | POA: Diagnosis not present

## 2017-12-11 DIAGNOSIS — I120 Hypertensive chronic kidney disease with stage 5 chronic kidney disease or end stage renal disease: Secondary | ICD-10-CM | POA: Diagnosis not present

## 2017-12-11 DIAGNOSIS — N189 Chronic kidney disease, unspecified: Secondary | ICD-10-CM | POA: Diagnosis not present

## 2017-12-11 DIAGNOSIS — E1142 Type 2 diabetes mellitus with diabetic polyneuropathy: Secondary | ICD-10-CM | POA: Diagnosis not present

## 2017-12-14 DIAGNOSIS — E1122 Type 2 diabetes mellitus with diabetic chronic kidney disease: Secondary | ICD-10-CM | POA: Diagnosis not present

## 2017-12-14 DIAGNOSIS — N189 Chronic kidney disease, unspecified: Secondary | ICD-10-CM | POA: Diagnosis not present

## 2017-12-14 DIAGNOSIS — L97522 Non-pressure chronic ulcer of other part of left foot with fat layer exposed: Secondary | ICD-10-CM | POA: Diagnosis not present

## 2017-12-14 DIAGNOSIS — I120 Hypertensive chronic kidney disease with stage 5 chronic kidney disease or end stage renal disease: Secondary | ICD-10-CM | POA: Diagnosis not present

## 2017-12-14 DIAGNOSIS — E1142 Type 2 diabetes mellitus with diabetic polyneuropathy: Secondary | ICD-10-CM | POA: Diagnosis not present

## 2017-12-14 DIAGNOSIS — E11621 Type 2 diabetes mellitus with foot ulcer: Secondary | ICD-10-CM | POA: Diagnosis not present

## 2017-12-16 DIAGNOSIS — Z85828 Personal history of other malignant neoplasm of skin: Secondary | ICD-10-CM | POA: Diagnosis not present

## 2017-12-16 DIAGNOSIS — E11621 Type 2 diabetes mellitus with foot ulcer: Secondary | ICD-10-CM | POA: Diagnosis not present

## 2017-12-16 DIAGNOSIS — Z79899 Other long term (current) drug therapy: Secondary | ICD-10-CM | POA: Diagnosis not present

## 2017-12-16 DIAGNOSIS — F419 Anxiety disorder, unspecified: Secondary | ICD-10-CM | POA: Diagnosis not present

## 2017-12-16 DIAGNOSIS — E1121 Type 2 diabetes mellitus with diabetic nephropathy: Secondary | ICD-10-CM | POA: Diagnosis not present

## 2017-12-16 DIAGNOSIS — E785 Hyperlipidemia, unspecified: Secondary | ICD-10-CM | POA: Diagnosis not present

## 2017-12-16 DIAGNOSIS — E1140 Type 2 diabetes mellitus with diabetic neuropathy, unspecified: Secondary | ICD-10-CM | POA: Diagnosis not present

## 2017-12-16 DIAGNOSIS — Z8546 Personal history of malignant neoplasm of prostate: Secondary | ICD-10-CM | POA: Diagnosis not present

## 2017-12-16 DIAGNOSIS — Z89512 Acquired absence of left leg below knee: Secondary | ICD-10-CM | POA: Diagnosis not present

## 2017-12-16 DIAGNOSIS — L97513 Non-pressure chronic ulcer of other part of right foot with necrosis of muscle: Secondary | ICD-10-CM | POA: Diagnosis not present

## 2017-12-16 DIAGNOSIS — L97511 Non-pressure chronic ulcer of other part of right foot limited to breakdown of skin: Secondary | ICD-10-CM | POA: Diagnosis not present

## 2017-12-16 DIAGNOSIS — L97515 Non-pressure chronic ulcer of other part of right foot with muscle involvement without evidence of necrosis: Secondary | ICD-10-CM | POA: Diagnosis not present

## 2017-12-16 DIAGNOSIS — L97518 Non-pressure chronic ulcer of other part of right foot with other specified severity: Secondary | ICD-10-CM | POA: Diagnosis not present

## 2017-12-16 DIAGNOSIS — I10 Essential (primary) hypertension: Secondary | ICD-10-CM | POA: Diagnosis not present

## 2017-12-16 DIAGNOSIS — Z7984 Long term (current) use of oral hypoglycemic drugs: Secondary | ICD-10-CM | POA: Diagnosis not present

## 2017-12-18 DIAGNOSIS — E1122 Type 2 diabetes mellitus with diabetic chronic kidney disease: Secondary | ICD-10-CM | POA: Diagnosis not present

## 2017-12-18 DIAGNOSIS — E1142 Type 2 diabetes mellitus with diabetic polyneuropathy: Secondary | ICD-10-CM | POA: Diagnosis not present

## 2017-12-18 DIAGNOSIS — N189 Chronic kidney disease, unspecified: Secondary | ICD-10-CM | POA: Diagnosis not present

## 2017-12-18 DIAGNOSIS — E11621 Type 2 diabetes mellitus with foot ulcer: Secondary | ICD-10-CM | POA: Diagnosis not present

## 2017-12-18 DIAGNOSIS — L97522 Non-pressure chronic ulcer of other part of left foot with fat layer exposed: Secondary | ICD-10-CM | POA: Diagnosis not present

## 2017-12-18 DIAGNOSIS — I120 Hypertensive chronic kidney disease with stage 5 chronic kidney disease or end stage renal disease: Secondary | ICD-10-CM | POA: Diagnosis not present

## 2017-12-25 DIAGNOSIS — L97522 Non-pressure chronic ulcer of other part of left foot with fat layer exposed: Secondary | ICD-10-CM | POA: Diagnosis not present

## 2017-12-25 DIAGNOSIS — E1142 Type 2 diabetes mellitus with diabetic polyneuropathy: Secondary | ICD-10-CM | POA: Diagnosis not present

## 2017-12-25 DIAGNOSIS — I120 Hypertensive chronic kidney disease with stage 5 chronic kidney disease or end stage renal disease: Secondary | ICD-10-CM | POA: Diagnosis not present

## 2017-12-25 DIAGNOSIS — N189 Chronic kidney disease, unspecified: Secondary | ICD-10-CM | POA: Diagnosis not present

## 2017-12-25 DIAGNOSIS — E11621 Type 2 diabetes mellitus with foot ulcer: Secondary | ICD-10-CM | POA: Diagnosis not present

## 2017-12-25 DIAGNOSIS — E1122 Type 2 diabetes mellitus with diabetic chronic kidney disease: Secondary | ICD-10-CM | POA: Diagnosis not present

## 2017-12-28 DIAGNOSIS — E11621 Type 2 diabetes mellitus with foot ulcer: Secondary | ICD-10-CM | POA: Diagnosis not present

## 2017-12-28 DIAGNOSIS — I120 Hypertensive chronic kidney disease with stage 5 chronic kidney disease or end stage renal disease: Secondary | ICD-10-CM | POA: Diagnosis not present

## 2017-12-28 DIAGNOSIS — L97522 Non-pressure chronic ulcer of other part of left foot with fat layer exposed: Secondary | ICD-10-CM | POA: Diagnosis not present

## 2017-12-28 DIAGNOSIS — N189 Chronic kidney disease, unspecified: Secondary | ICD-10-CM | POA: Diagnosis not present

## 2017-12-28 DIAGNOSIS — E1142 Type 2 diabetes mellitus with diabetic polyneuropathy: Secondary | ICD-10-CM | POA: Diagnosis not present

## 2017-12-28 DIAGNOSIS — E1122 Type 2 diabetes mellitus with diabetic chronic kidney disease: Secondary | ICD-10-CM | POA: Diagnosis not present

## 2017-12-29 DIAGNOSIS — R399 Unspecified symptoms and signs involving the genitourinary system: Secondary | ICD-10-CM | POA: Diagnosis not present

## 2017-12-29 DIAGNOSIS — Z8546 Personal history of malignant neoplasm of prostate: Secondary | ICD-10-CM | POA: Diagnosis not present

## 2017-12-29 DIAGNOSIS — N281 Cyst of kidney, acquired: Secondary | ICD-10-CM | POA: Diagnosis not present

## 2017-12-29 DIAGNOSIS — R972 Elevated prostate specific antigen [PSA]: Secondary | ICD-10-CM | POA: Diagnosis not present

## 2017-12-30 DIAGNOSIS — L97522 Non-pressure chronic ulcer of other part of left foot with fat layer exposed: Secondary | ICD-10-CM | POA: Diagnosis not present

## 2017-12-30 DIAGNOSIS — N189 Chronic kidney disease, unspecified: Secondary | ICD-10-CM | POA: Diagnosis not present

## 2017-12-30 DIAGNOSIS — E11621 Type 2 diabetes mellitus with foot ulcer: Secondary | ICD-10-CM | POA: Diagnosis not present

## 2017-12-30 DIAGNOSIS — E1142 Type 2 diabetes mellitus with diabetic polyneuropathy: Secondary | ICD-10-CM | POA: Diagnosis not present

## 2017-12-30 DIAGNOSIS — I120 Hypertensive chronic kidney disease with stage 5 chronic kidney disease or end stage renal disease: Secondary | ICD-10-CM | POA: Diagnosis not present

## 2017-12-30 DIAGNOSIS — E1122 Type 2 diabetes mellitus with diabetic chronic kidney disease: Secondary | ICD-10-CM | POA: Diagnosis not present

## 2018-01-04 DIAGNOSIS — E1122 Type 2 diabetes mellitus with diabetic chronic kidney disease: Secondary | ICD-10-CM | POA: Diagnosis not present

## 2018-01-04 DIAGNOSIS — E1142 Type 2 diabetes mellitus with diabetic polyneuropathy: Secondary | ICD-10-CM | POA: Diagnosis not present

## 2018-01-04 DIAGNOSIS — N189 Chronic kidney disease, unspecified: Secondary | ICD-10-CM | POA: Diagnosis not present

## 2018-01-04 DIAGNOSIS — E11621 Type 2 diabetes mellitus with foot ulcer: Secondary | ICD-10-CM | POA: Diagnosis not present

## 2018-01-04 DIAGNOSIS — I120 Hypertensive chronic kidney disease with stage 5 chronic kidney disease or end stage renal disease: Secondary | ICD-10-CM | POA: Diagnosis not present

## 2018-01-04 DIAGNOSIS — L97522 Non-pressure chronic ulcer of other part of left foot with fat layer exposed: Secondary | ICD-10-CM | POA: Diagnosis not present

## 2018-01-07 DIAGNOSIS — I120 Hypertensive chronic kidney disease with stage 5 chronic kidney disease or end stage renal disease: Secondary | ICD-10-CM | POA: Diagnosis not present

## 2018-01-07 DIAGNOSIS — E1122 Type 2 diabetes mellitus with diabetic chronic kidney disease: Secondary | ICD-10-CM | POA: Diagnosis not present

## 2018-01-07 DIAGNOSIS — E11621 Type 2 diabetes mellitus with foot ulcer: Secondary | ICD-10-CM | POA: Diagnosis not present

## 2018-01-07 DIAGNOSIS — E1142 Type 2 diabetes mellitus with diabetic polyneuropathy: Secondary | ICD-10-CM | POA: Diagnosis not present

## 2018-01-07 DIAGNOSIS — N189 Chronic kidney disease, unspecified: Secondary | ICD-10-CM | POA: Diagnosis not present

## 2018-01-07 DIAGNOSIS — L97522 Non-pressure chronic ulcer of other part of left foot with fat layer exposed: Secondary | ICD-10-CM | POA: Diagnosis not present

## 2018-01-08 ENCOUNTER — Encounter: Payer: Self-pay | Admitting: Vascular Surgery

## 2018-01-08 ENCOUNTER — Ambulatory Visit (INDEPENDENT_AMBULATORY_CARE_PROVIDER_SITE_OTHER): Payer: Medicare Other | Admitting: Vascular Surgery

## 2018-01-08 VITALS — BP 130/81 | HR 57 | Temp 97.1°F | Resp 18 | Ht 67.0 in | Wt 246.0 lb

## 2018-01-08 DIAGNOSIS — E1051 Type 1 diabetes mellitus with diabetic peripheral angiopathy without gangrene: Secondary | ICD-10-CM | POA: Diagnosis not present

## 2018-01-08 DIAGNOSIS — IMO0002 Reserved for concepts with insufficient information to code with codable children: Secondary | ICD-10-CM

## 2018-01-08 DIAGNOSIS — E1065 Type 1 diabetes mellitus with hyperglycemia: Secondary | ICD-10-CM

## 2018-01-08 NOTE — Progress Notes (Signed)
Patient ID: [name] [name], male   DOB: 01/10/1946, 72 y.o.   MRN: [id number]  Reason for Consult: PAD (nonhealing foot ulcer)   Referred by Monico Blitz, MD  Subjective:     HPI:  [name] [name] is a 72 y.o. male with history of diabetes and has a previous left below-knee amputation.  He also has a debridement of the right side of his foot which is been very slow to heal.  He is now maintained on suppressive antibiotics and followed by the wound center.  He recently had noninvasive vascular studies.  He does walk and wears a protective shoe.  He is not having any fevers or chills at this time.  He does not have any symptoms of claudication or rest pain.  He does have significant neuropathy in his feet greater than his bilateral hands.  Past Medical History:  Diagnosis Date  . Cancer ([place])    [place]- Stage I  . Critical lower limb ischemia   . Essential hypertension   . Gangrene of foot [facility]) March 2016   Left foot  . Hyperlipidemia   . Type 2 diabetes mellitus ([facility])    Family History  Problem Relation Age of Onset  . Hypertension Father        and kidney failure  . Heart disease Father   . Heart attack Father   . Kidney disease Father   . Heart attack Maternal Grandfather   . Heart attack Paternal Grandfather    Past Surgical History:  Procedure Laterality Date  . AMPUTATION Left 01/12/2015   Procedure: AMPUTATION BELOW KNEE;  Surgeon: Leandrew Koyanagi, MD;  Location: [place];  Service: Orthopedics;  Laterality: Left;  . APPLICATION OF WOUND VAC Right 01/12/2015   Procedure: APPLICATION OF WOUND VAC;  Surgeon: Leandrew Koyanagi, MD;  Location: [place];  Service: Orthopedics;  Laterality: Right;    Short Social History:  Social History   Tobacco Use  . Smoking status: Never Smoker  . Smokeless tobacco: Never Used  Substance Use Topics  . Alcohol use: No    Alcohol/week: 0.0 oz    Comment: very rarely    No Known Allergies  Current Outpatient Medications  Medication Sig  Dispense Refill  . alprazolam (XANAX) 2 MG tablet Take 1 mg by mouth at bedtime.     Marland Kitchen amoxicillin-clavulanate (AUGMENTIN) 875-125 MG per tablet Take 1 tablet by mouth 2 (two) times daily. 14 tablet 0  . aspirin EC 81 MG tablet Take 81 mg by mouth daily.    . cephALEXin (KEFLEX) 500 MG capsule 1,000 mg 2 (two) times daily.    . chlorthalidone (HYGROTON) 25 MG tablet Take 25 mg by mouth daily.    . citalopram (CELEXA) 40 MG tablet Take 40 mg by mouth daily.    Marland Kitchen gabapentin (NEURONTIN) 100 MG capsule Take 100 mg by mouth 3 (three) times daily.    Marland Kitchen losartan (COZAAR) 100 MG tablet Take 100 mg by mouth daily.    . metFORMIN (GLUCOPHAGE) 500 MG tablet Take 500 mg by mouth 2 (two) times daily.    . metoprolol (LOPRESSOR) 100 MG tablet Take 100 mg by mouth 2 (two) times daily.    . Multiple Vitamin (MULTIVITAMIN) capsule Take 1 capsule by mouth daily.    . Omega-3 Fatty Acids (FISH OIL) 1000 MG CAPS Take 1 capsule by mouth daily.    . simvastatin (ZOCOR) 40 MG tablet Take 40 mg by mouth daily.    . tamsulosin (FLOMAX)  0.4 MG CAPS capsule Take 0.4 mg by mouth daily.    . traZODone (DESYREL) 50 MG tablet Take 50 mg by mouth at bedtime.    . vitamin E 400 UNIT capsule Take 400 Units by mouth daily.    Marland Kitchen acetaminophen (TYLENOL) 325 MG tablet Take 2 tablets (650 mg total) by mouth every 6 (six) hours as needed for mild pain (or Fever >/= 101). (Patient not taking: Reported on 01/08/2018)    . folic acid (FOLVITE) 979 MCG tablet Take 400 mcg by mouth daily.    Marland Kitchen HYDROcodone-acetaminophen (NORCO/VICODIN) 5-325 MG per tablet Take 1 tablet by mouth every 4 (four) hours as needed for moderate pain. (Patient not taking: Reported on 01/08/2018) 30 tablet 0  . insulin aspart (NOVOLOG) 100 UNIT/ML injection Inject 0-9 Units into the skin 3 (three) times daily with meals. Sliding scale  CBG 70 - 120: 0 units  CBG 121 - 150: 1 unit,   CBG 151 - 200: 2 units,   CBG 201 - 250: 3 units,   CBG 251 - 300: 5 units,   CBG  301 - 350: 7 units,   CBG 351 - 400: 9 units    CBG > 400: 9 units and notify your MD (Patient not taking: Reported on 01/08/2018) 10 mL 1  . mupirocin ointment (BACTROBAN) 2 % Place 1 application into the nose 2 (two) times daily. Use through 01/18/2015 (Patient not taking: Reported on 01/08/2018) 22 g 0  . rivaroxaban (XARELTO) 20 MG TABS tablet Take 1 tablet (20 mg total) by mouth daily with supper. (Patient not taking: Reported on 01/08/2018) 30 tablet    No current facility-administered medications for this visit.     Review of Systems  Constitutional:  Constitutional negative. HENT: HENT negative.  Eyes: Eyes negative.  Respiratory: Positive for shortness of breath.  GI: Gastrointestinal negative.  Musculoskeletal: Musculoskeletal negative.  Skin: Positive for wound.  Neurological: Neurological negative. Hematologic: Hematologic/lymphatic negative.  Psychiatric: Psychiatric negative.        Objective:  Objective   Vitals:   01/08/18 1358  BP: 130/81  Pulse: (!) 57  Resp: 18  Temp: (!) 97.1 F (36.2 C)  TempSrc: Oral  SpO2: 96%  Weight: 246 lb (111.6 kg)  Height: 5\' 7"  (1.702 m)   Body mass index is 38.53 kg/m.  Physical Exam  Constitutional: He is oriented to person, place, and time. He appears well-developed.  HENT:  Head: Normocephalic.  Eyes: Pupils are equal, round, and reactive to light.  Neck: Normal range of motion. Neck supple.  Cardiovascular: Normal rate.  Pulses:      Popliteal pulses are 2+ on the right side.       Dorsalis pedis pulses are 2+ on the right side.  Abdominal: Soft. He exhibits no mass.  Musculoskeletal: He exhibits no edema.  Left bka  Neurological: He is oriented to person, place, and time.  Skin:  Right lateral foot wound 3 x 2 cm, granulation at base  Psychiatric: He has a normal mood and affect. His behavior is normal.    Data: I reviewed his recent ABIs which are greater than 1 triphasic on the right.       Assessment/Plan:     72 year old male here with right lateral foot ulceration.  He does have a palpable dorsalis pedis pulse and triphasic waveforms on his noninvasive arterial studies.  My standpoint no endovascular intervention is indicated he should continue with wound care and hopefully can get this to  heal.  If he has any further issues I can certainly happy to see him he can otherwise follow-up on an as-needed basis.     Waynetta Sandy MD [facility]

## 2018-01-11 DIAGNOSIS — I120 Hypertensive chronic kidney disease with stage 5 chronic kidney disease or end stage renal disease: Secondary | ICD-10-CM | POA: Diagnosis not present

## 2018-01-11 DIAGNOSIS — L97522 Non-pressure chronic ulcer of other part of left foot with fat layer exposed: Secondary | ICD-10-CM | POA: Diagnosis not present

## 2018-01-11 DIAGNOSIS — E1142 Type 2 diabetes mellitus with diabetic polyneuropathy: Secondary | ICD-10-CM | POA: Diagnosis not present

## 2018-01-11 DIAGNOSIS — E1122 Type 2 diabetes mellitus with diabetic chronic kidney disease: Secondary | ICD-10-CM | POA: Diagnosis not present

## 2018-01-11 DIAGNOSIS — E11621 Type 2 diabetes mellitus with foot ulcer: Secondary | ICD-10-CM | POA: Diagnosis not present

## 2018-01-11 DIAGNOSIS — N189 Chronic kidney disease, unspecified: Secondary | ICD-10-CM | POA: Diagnosis not present

## 2018-01-13 ENCOUNTER — Encounter: Payer: Self-pay | Admitting: Surgery

## 2018-01-13 DIAGNOSIS — E11621 Type 2 diabetes mellitus with foot ulcer: Secondary | ICD-10-CM | POA: Diagnosis not present

## 2018-01-13 DIAGNOSIS — E1142 Type 2 diabetes mellitus with diabetic polyneuropathy: Secondary | ICD-10-CM | POA: Diagnosis not present

## 2018-01-13 DIAGNOSIS — I120 Hypertensive chronic kidney disease with stage 5 chronic kidney disease or end stage renal disease: Secondary | ICD-10-CM | POA: Diagnosis not present

## 2018-01-13 DIAGNOSIS — E1122 Type 2 diabetes mellitus with diabetic chronic kidney disease: Secondary | ICD-10-CM | POA: Diagnosis not present

## 2018-01-13 DIAGNOSIS — L97522 Non-pressure chronic ulcer of other part of left foot with fat layer exposed: Secondary | ICD-10-CM | POA: Diagnosis not present

## 2018-01-13 DIAGNOSIS — N189 Chronic kidney disease, unspecified: Secondary | ICD-10-CM | POA: Diagnosis not present

## 2018-01-15 DIAGNOSIS — E1122 Type 2 diabetes mellitus with diabetic chronic kidney disease: Secondary | ICD-10-CM | POA: Diagnosis not present

## 2018-01-15 DIAGNOSIS — L97522 Non-pressure chronic ulcer of other part of left foot with fat layer exposed: Secondary | ICD-10-CM | POA: Diagnosis not present

## 2018-01-15 DIAGNOSIS — E11621 Type 2 diabetes mellitus with foot ulcer: Secondary | ICD-10-CM | POA: Diagnosis not present

## 2018-01-15 DIAGNOSIS — E1142 Type 2 diabetes mellitus with diabetic polyneuropathy: Secondary | ICD-10-CM | POA: Diagnosis not present

## 2018-01-15 DIAGNOSIS — I120 Hypertensive chronic kidney disease with stage 5 chronic kidney disease or end stage renal disease: Secondary | ICD-10-CM | POA: Diagnosis not present

## 2018-01-15 DIAGNOSIS — N189 Chronic kidney disease, unspecified: Secondary | ICD-10-CM | POA: Diagnosis not present

## 2018-01-18 DIAGNOSIS — E1142 Type 2 diabetes mellitus with diabetic polyneuropathy: Secondary | ICD-10-CM | POA: Diagnosis not present

## 2018-01-18 DIAGNOSIS — I120 Hypertensive chronic kidney disease with stage 5 chronic kidney disease or end stage renal disease: Secondary | ICD-10-CM | POA: Diagnosis not present

## 2018-01-18 DIAGNOSIS — E1122 Type 2 diabetes mellitus with diabetic chronic kidney disease: Secondary | ICD-10-CM | POA: Diagnosis not present

## 2018-01-18 DIAGNOSIS — E11621 Type 2 diabetes mellitus with foot ulcer: Secondary | ICD-10-CM | POA: Diagnosis not present

## 2018-01-18 DIAGNOSIS — N189 Chronic kidney disease, unspecified: Secondary | ICD-10-CM | POA: Diagnosis not present

## 2018-01-18 DIAGNOSIS — L97522 Non-pressure chronic ulcer of other part of left foot with fat layer exposed: Secondary | ICD-10-CM | POA: Diagnosis not present

## 2018-01-20 DIAGNOSIS — E1122 Type 2 diabetes mellitus with diabetic chronic kidney disease: Secondary | ICD-10-CM | POA: Diagnosis not present

## 2018-01-20 DIAGNOSIS — I120 Hypertensive chronic kidney disease with stage 5 chronic kidney disease or end stage renal disease: Secondary | ICD-10-CM | POA: Diagnosis not present

## 2018-01-20 DIAGNOSIS — E1142 Type 2 diabetes mellitus with diabetic polyneuropathy: Secondary | ICD-10-CM | POA: Diagnosis not present

## 2018-01-20 DIAGNOSIS — N189 Chronic kidney disease, unspecified: Secondary | ICD-10-CM | POA: Diagnosis not present

## 2018-01-20 DIAGNOSIS — E11621 Type 2 diabetes mellitus with foot ulcer: Secondary | ICD-10-CM | POA: Diagnosis not present

## 2018-01-20 DIAGNOSIS — L97522 Non-pressure chronic ulcer of other part of left foot with fat layer exposed: Secondary | ICD-10-CM | POA: Diagnosis not present

## 2018-01-22 DIAGNOSIS — L97522 Non-pressure chronic ulcer of other part of left foot with fat layer exposed: Secondary | ICD-10-CM | POA: Diagnosis not present

## 2018-01-22 DIAGNOSIS — N189 Chronic kidney disease, unspecified: Secondary | ICD-10-CM | POA: Diagnosis not present

## 2018-01-22 DIAGNOSIS — E1142 Type 2 diabetes mellitus with diabetic polyneuropathy: Secondary | ICD-10-CM | POA: Diagnosis not present

## 2018-01-22 DIAGNOSIS — E1122 Type 2 diabetes mellitus with diabetic chronic kidney disease: Secondary | ICD-10-CM | POA: Diagnosis not present

## 2018-01-22 DIAGNOSIS — E11621 Type 2 diabetes mellitus with foot ulcer: Secondary | ICD-10-CM | POA: Diagnosis not present

## 2018-01-22 DIAGNOSIS — I120 Hypertensive chronic kidney disease with stage 5 chronic kidney disease or end stage renal disease: Secondary | ICD-10-CM | POA: Diagnosis not present

## 2018-01-25 DIAGNOSIS — E1122 Type 2 diabetes mellitus with diabetic chronic kidney disease: Secondary | ICD-10-CM | POA: Diagnosis not present

## 2018-01-25 DIAGNOSIS — I120 Hypertensive chronic kidney disease with stage 5 chronic kidney disease or end stage renal disease: Secondary | ICD-10-CM | POA: Diagnosis not present

## 2018-01-25 DIAGNOSIS — L97522 Non-pressure chronic ulcer of other part of left foot with fat layer exposed: Secondary | ICD-10-CM | POA: Diagnosis not present

## 2018-01-25 DIAGNOSIS — E11621 Type 2 diabetes mellitus with foot ulcer: Secondary | ICD-10-CM | POA: Diagnosis not present

## 2018-01-25 DIAGNOSIS — N189 Chronic kidney disease, unspecified: Secondary | ICD-10-CM | POA: Diagnosis not present

## 2018-01-25 DIAGNOSIS — E1142 Type 2 diabetes mellitus with diabetic polyneuropathy: Secondary | ICD-10-CM | POA: Diagnosis not present

## 2018-01-27 DIAGNOSIS — I10 Essential (primary) hypertension: Secondary | ICD-10-CM | POA: Diagnosis not present

## 2018-01-27 DIAGNOSIS — Z6836 Body mass index (BMI) 36.0-36.9, adult: Secondary | ICD-10-CM | POA: Diagnosis not present

## 2018-01-27 DIAGNOSIS — N183 Chronic kidney disease, stage 3 (moderate): Secondary | ICD-10-CM | POA: Diagnosis not present

## 2018-01-27 DIAGNOSIS — E1122 Type 2 diabetes mellitus with diabetic chronic kidney disease: Secondary | ICD-10-CM | POA: Diagnosis not present

## 2018-01-27 DIAGNOSIS — Z299 Encounter for prophylactic measures, unspecified: Secondary | ICD-10-CM | POA: Diagnosis not present

## 2018-01-27 DIAGNOSIS — E1165 Type 2 diabetes mellitus with hyperglycemia: Secondary | ICD-10-CM | POA: Diagnosis not present

## 2018-01-29 DIAGNOSIS — E1122 Type 2 diabetes mellitus with diabetic chronic kidney disease: Secondary | ICD-10-CM | POA: Diagnosis not present

## 2018-01-29 DIAGNOSIS — I120 Hypertensive chronic kidney disease with stage 5 chronic kidney disease or end stage renal disease: Secondary | ICD-10-CM | POA: Diagnosis not present

## 2018-01-29 DIAGNOSIS — L97522 Non-pressure chronic ulcer of other part of left foot with fat layer exposed: Secondary | ICD-10-CM | POA: Diagnosis not present

## 2018-01-29 DIAGNOSIS — N189 Chronic kidney disease, unspecified: Secondary | ICD-10-CM | POA: Diagnosis not present

## 2018-01-29 DIAGNOSIS — E1142 Type 2 diabetes mellitus with diabetic polyneuropathy: Secondary | ICD-10-CM | POA: Diagnosis not present

## 2018-01-29 DIAGNOSIS — E11621 Type 2 diabetes mellitus with foot ulcer: Secondary | ICD-10-CM | POA: Diagnosis not present

## 2018-02-01 DIAGNOSIS — E11621 Type 2 diabetes mellitus with foot ulcer: Secondary | ICD-10-CM | POA: Diagnosis not present

## 2018-02-01 DIAGNOSIS — L97518 Non-pressure chronic ulcer of other part of right foot with other specified severity: Secondary | ICD-10-CM | POA: Diagnosis not present

## 2018-02-01 DIAGNOSIS — L97513 Non-pressure chronic ulcer of other part of right foot with necrosis of muscle: Secondary | ICD-10-CM | POA: Diagnosis not present

## 2018-02-03 DIAGNOSIS — I120 Hypertensive chronic kidney disease with stage 5 chronic kidney disease or end stage renal disease: Secondary | ICD-10-CM | POA: Diagnosis not present

## 2018-02-03 DIAGNOSIS — N189 Chronic kidney disease, unspecified: Secondary | ICD-10-CM | POA: Diagnosis not present

## 2018-02-03 DIAGNOSIS — E1142 Type 2 diabetes mellitus with diabetic polyneuropathy: Secondary | ICD-10-CM | POA: Diagnosis not present

## 2018-02-03 DIAGNOSIS — L97522 Non-pressure chronic ulcer of other part of left foot with fat layer exposed: Secondary | ICD-10-CM | POA: Diagnosis not present

## 2018-02-03 DIAGNOSIS — E11621 Type 2 diabetes mellitus with foot ulcer: Secondary | ICD-10-CM | POA: Diagnosis not present

## 2018-02-03 DIAGNOSIS — E1122 Type 2 diabetes mellitus with diabetic chronic kidney disease: Secondary | ICD-10-CM | POA: Diagnosis not present

## 2018-02-05 DIAGNOSIS — L97522 Non-pressure chronic ulcer of other part of left foot with fat layer exposed: Secondary | ICD-10-CM | POA: Diagnosis not present

## 2018-02-05 DIAGNOSIS — E1142 Type 2 diabetes mellitus with diabetic polyneuropathy: Secondary | ICD-10-CM | POA: Diagnosis not present

## 2018-02-05 DIAGNOSIS — E11621 Type 2 diabetes mellitus with foot ulcer: Secondary | ICD-10-CM | POA: Diagnosis not present

## 2018-02-05 DIAGNOSIS — N189 Chronic kidney disease, unspecified: Secondary | ICD-10-CM | POA: Diagnosis not present

## 2018-02-05 DIAGNOSIS — E1122 Type 2 diabetes mellitus with diabetic chronic kidney disease: Secondary | ICD-10-CM | POA: Diagnosis not present

## 2018-02-05 DIAGNOSIS — I120 Hypertensive chronic kidney disease with stage 5 chronic kidney disease or end stage renal disease: Secondary | ICD-10-CM | POA: Diagnosis not present

## 2018-02-08 DIAGNOSIS — Z89512 Acquired absence of left leg below knee: Secondary | ICD-10-CM | POA: Diagnosis not present

## 2018-02-08 DIAGNOSIS — I120 Hypertensive chronic kidney disease with stage 5 chronic kidney disease or end stage renal disease: Secondary | ICD-10-CM | POA: Diagnosis not present

## 2018-02-08 DIAGNOSIS — L97511 Non-pressure chronic ulcer of other part of right foot limited to breakdown of skin: Secondary | ICD-10-CM | POA: Diagnosis not present

## 2018-02-08 DIAGNOSIS — N189 Chronic kidney disease, unspecified: Secondary | ICD-10-CM | POA: Diagnosis not present

## 2018-02-08 DIAGNOSIS — F329 Major depressive disorder, single episode, unspecified: Secondary | ICD-10-CM | POA: Diagnosis not present

## 2018-02-08 DIAGNOSIS — F419 Anxiety disorder, unspecified: Secondary | ICD-10-CM | POA: Diagnosis not present

## 2018-02-08 DIAGNOSIS — Z7984 Long term (current) use of oral hypoglycemic drugs: Secondary | ICD-10-CM | POA: Diagnosis not present

## 2018-02-08 DIAGNOSIS — E1122 Type 2 diabetes mellitus with diabetic chronic kidney disease: Secondary | ICD-10-CM | POA: Diagnosis not present

## 2018-02-08 DIAGNOSIS — E1142 Type 2 diabetes mellitus with diabetic polyneuropathy: Secondary | ICD-10-CM | POA: Diagnosis not present

## 2018-02-08 DIAGNOSIS — E11621 Type 2 diabetes mellitus with foot ulcer: Secondary | ICD-10-CM | POA: Diagnosis not present

## 2018-02-12 DIAGNOSIS — I120 Hypertensive chronic kidney disease with stage 5 chronic kidney disease or end stage renal disease: Secondary | ICD-10-CM | POA: Diagnosis not present

## 2018-02-12 DIAGNOSIS — E1142 Type 2 diabetes mellitus with diabetic polyneuropathy: Secondary | ICD-10-CM | POA: Diagnosis not present

## 2018-02-12 DIAGNOSIS — E1122 Type 2 diabetes mellitus with diabetic chronic kidney disease: Secondary | ICD-10-CM | POA: Diagnosis not present

## 2018-02-12 DIAGNOSIS — E11621 Type 2 diabetes mellitus with foot ulcer: Secondary | ICD-10-CM | POA: Diagnosis not present

## 2018-02-12 DIAGNOSIS — L97511 Non-pressure chronic ulcer of other part of right foot limited to breakdown of skin: Secondary | ICD-10-CM | POA: Diagnosis not present

## 2018-02-12 DIAGNOSIS — N189 Chronic kidney disease, unspecified: Secondary | ICD-10-CM | POA: Diagnosis not present

## 2018-02-15 DIAGNOSIS — E1122 Type 2 diabetes mellitus with diabetic chronic kidney disease: Secondary | ICD-10-CM | POA: Diagnosis not present

## 2018-02-15 DIAGNOSIS — L97511 Non-pressure chronic ulcer of other part of right foot limited to breakdown of skin: Secondary | ICD-10-CM | POA: Diagnosis not present

## 2018-02-15 DIAGNOSIS — E11621 Type 2 diabetes mellitus with foot ulcer: Secondary | ICD-10-CM | POA: Diagnosis not present

## 2018-02-15 DIAGNOSIS — N189 Chronic kidney disease, unspecified: Secondary | ICD-10-CM | POA: Diagnosis not present

## 2018-02-15 DIAGNOSIS — E1142 Type 2 diabetes mellitus with diabetic polyneuropathy: Secondary | ICD-10-CM | POA: Diagnosis not present

## 2018-02-15 DIAGNOSIS — I120 Hypertensive chronic kidney disease with stage 5 chronic kidney disease or end stage renal disease: Secondary | ICD-10-CM | POA: Diagnosis not present

## 2018-02-22 DIAGNOSIS — I120 Hypertensive chronic kidney disease with stage 5 chronic kidney disease or end stage renal disease: Secondary | ICD-10-CM | POA: Diagnosis not present

## 2018-02-22 DIAGNOSIS — E1122 Type 2 diabetes mellitus with diabetic chronic kidney disease: Secondary | ICD-10-CM | POA: Diagnosis not present

## 2018-02-22 DIAGNOSIS — E11621 Type 2 diabetes mellitus with foot ulcer: Secondary | ICD-10-CM | POA: Diagnosis not present

## 2018-02-22 DIAGNOSIS — L97511 Non-pressure chronic ulcer of other part of right foot limited to breakdown of skin: Secondary | ICD-10-CM | POA: Diagnosis not present

## 2018-02-22 DIAGNOSIS — N189 Chronic kidney disease, unspecified: Secondary | ICD-10-CM | POA: Diagnosis not present

## 2018-02-22 DIAGNOSIS — E1142 Type 2 diabetes mellitus with diabetic polyneuropathy: Secondary | ICD-10-CM | POA: Diagnosis not present

## 2018-02-24 DIAGNOSIS — E11621 Type 2 diabetes mellitus with foot ulcer: Secondary | ICD-10-CM | POA: Diagnosis not present

## 2018-02-24 DIAGNOSIS — L97513 Non-pressure chronic ulcer of other part of right foot with necrosis of muscle: Secondary | ICD-10-CM | POA: Diagnosis not present

## 2018-02-24 DIAGNOSIS — L97518 Non-pressure chronic ulcer of other part of right foot with other specified severity: Secondary | ICD-10-CM | POA: Diagnosis not present

## 2018-02-26 DIAGNOSIS — I120 Hypertensive chronic kidney disease with stage 5 chronic kidney disease or end stage renal disease: Secondary | ICD-10-CM | POA: Diagnosis not present

## 2018-02-26 DIAGNOSIS — E11621 Type 2 diabetes mellitus with foot ulcer: Secondary | ICD-10-CM | POA: Diagnosis not present

## 2018-02-26 DIAGNOSIS — E1122 Type 2 diabetes mellitus with diabetic chronic kidney disease: Secondary | ICD-10-CM | POA: Diagnosis not present

## 2018-02-26 DIAGNOSIS — N189 Chronic kidney disease, unspecified: Secondary | ICD-10-CM | POA: Diagnosis not present

## 2018-02-26 DIAGNOSIS — E1142 Type 2 diabetes mellitus with diabetic polyneuropathy: Secondary | ICD-10-CM | POA: Diagnosis not present

## 2018-02-26 DIAGNOSIS — L97511 Non-pressure chronic ulcer of other part of right foot limited to breakdown of skin: Secondary | ICD-10-CM | POA: Diagnosis not present

## 2018-03-01 DIAGNOSIS — E1142 Type 2 diabetes mellitus with diabetic polyneuropathy: Secondary | ICD-10-CM | POA: Diagnosis not present

## 2018-03-01 DIAGNOSIS — E1122 Type 2 diabetes mellitus with diabetic chronic kidney disease: Secondary | ICD-10-CM | POA: Diagnosis not present

## 2018-03-01 DIAGNOSIS — L97511 Non-pressure chronic ulcer of other part of right foot limited to breakdown of skin: Secondary | ICD-10-CM | POA: Diagnosis not present

## 2018-03-01 DIAGNOSIS — N189 Chronic kidney disease, unspecified: Secondary | ICD-10-CM | POA: Diagnosis not present

## 2018-03-01 DIAGNOSIS — I120 Hypertensive chronic kidney disease with stage 5 chronic kidney disease or end stage renal disease: Secondary | ICD-10-CM | POA: Diagnosis not present

## 2018-03-01 DIAGNOSIS — E11621 Type 2 diabetes mellitus with foot ulcer: Secondary | ICD-10-CM | POA: Diagnosis not present

## 2018-03-03 DIAGNOSIS — L97511 Non-pressure chronic ulcer of other part of right foot limited to breakdown of skin: Secondary | ICD-10-CM | POA: Diagnosis not present

## 2018-03-03 DIAGNOSIS — E11621 Type 2 diabetes mellitus with foot ulcer: Secondary | ICD-10-CM | POA: Diagnosis not present

## 2018-03-03 DIAGNOSIS — E1142 Type 2 diabetes mellitus with diabetic polyneuropathy: Secondary | ICD-10-CM | POA: Diagnosis not present

## 2018-03-03 DIAGNOSIS — E1122 Type 2 diabetes mellitus with diabetic chronic kidney disease: Secondary | ICD-10-CM | POA: Diagnosis not present

## 2018-03-03 DIAGNOSIS — I120 Hypertensive chronic kidney disease with stage 5 chronic kidney disease or end stage renal disease: Secondary | ICD-10-CM | POA: Diagnosis not present

## 2018-03-03 DIAGNOSIS — N189 Chronic kidney disease, unspecified: Secondary | ICD-10-CM | POA: Diagnosis not present

## 2018-03-05 DIAGNOSIS — L97511 Non-pressure chronic ulcer of other part of right foot limited to breakdown of skin: Secondary | ICD-10-CM | POA: Diagnosis not present

## 2018-03-05 DIAGNOSIS — E1122 Type 2 diabetes mellitus with diabetic chronic kidney disease: Secondary | ICD-10-CM | POA: Diagnosis not present

## 2018-03-05 DIAGNOSIS — I120 Hypertensive chronic kidney disease with stage 5 chronic kidney disease or end stage renal disease: Secondary | ICD-10-CM | POA: Diagnosis not present

## 2018-03-05 DIAGNOSIS — N189 Chronic kidney disease, unspecified: Secondary | ICD-10-CM | POA: Diagnosis not present

## 2018-03-05 DIAGNOSIS — E11621 Type 2 diabetes mellitus with foot ulcer: Secondary | ICD-10-CM | POA: Diagnosis not present

## 2018-03-05 DIAGNOSIS — E1142 Type 2 diabetes mellitus with diabetic polyneuropathy: Secondary | ICD-10-CM | POA: Diagnosis not present

## 2018-03-08 DIAGNOSIS — I120 Hypertensive chronic kidney disease with stage 5 chronic kidney disease or end stage renal disease: Secondary | ICD-10-CM | POA: Diagnosis not present

## 2018-03-08 DIAGNOSIS — E11621 Type 2 diabetes mellitus with foot ulcer: Secondary | ICD-10-CM | POA: Diagnosis not present

## 2018-03-08 DIAGNOSIS — E1122 Type 2 diabetes mellitus with diabetic chronic kidney disease: Secondary | ICD-10-CM | POA: Diagnosis not present

## 2018-03-08 DIAGNOSIS — N189 Chronic kidney disease, unspecified: Secondary | ICD-10-CM | POA: Diagnosis not present

## 2018-03-08 DIAGNOSIS — L97511 Non-pressure chronic ulcer of other part of right foot limited to breakdown of skin: Secondary | ICD-10-CM | POA: Diagnosis not present

## 2018-03-08 DIAGNOSIS — E1142 Type 2 diabetes mellitus with diabetic polyneuropathy: Secondary | ICD-10-CM | POA: Diagnosis not present

## 2018-03-10 DIAGNOSIS — N189 Chronic kidney disease, unspecified: Secondary | ICD-10-CM | POA: Diagnosis not present

## 2018-03-10 DIAGNOSIS — E1142 Type 2 diabetes mellitus with diabetic polyneuropathy: Secondary | ICD-10-CM | POA: Diagnosis not present

## 2018-03-10 DIAGNOSIS — E11621 Type 2 diabetes mellitus with foot ulcer: Secondary | ICD-10-CM | POA: Diagnosis not present

## 2018-03-10 DIAGNOSIS — L97511 Non-pressure chronic ulcer of other part of right foot limited to breakdown of skin: Secondary | ICD-10-CM | POA: Diagnosis not present

## 2018-03-10 DIAGNOSIS — I120 Hypertensive chronic kidney disease with stage 5 chronic kidney disease or end stage renal disease: Secondary | ICD-10-CM | POA: Diagnosis not present

## 2018-03-10 DIAGNOSIS — E1122 Type 2 diabetes mellitus with diabetic chronic kidney disease: Secondary | ICD-10-CM | POA: Diagnosis not present

## 2018-03-22 DIAGNOSIS — E1122 Type 2 diabetes mellitus with diabetic chronic kidney disease: Secondary | ICD-10-CM | POA: Diagnosis not present

## 2018-03-22 DIAGNOSIS — I120 Hypertensive chronic kidney disease with stage 5 chronic kidney disease or end stage renal disease: Secondary | ICD-10-CM | POA: Diagnosis not present

## 2018-03-22 DIAGNOSIS — E1142 Type 2 diabetes mellitus with diabetic polyneuropathy: Secondary | ICD-10-CM | POA: Diagnosis not present

## 2018-03-22 DIAGNOSIS — N189 Chronic kidney disease, unspecified: Secondary | ICD-10-CM | POA: Diagnosis not present

## 2018-03-22 DIAGNOSIS — E11621 Type 2 diabetes mellitus with foot ulcer: Secondary | ICD-10-CM | POA: Diagnosis not present

## 2018-03-22 DIAGNOSIS — L97511 Non-pressure chronic ulcer of other part of right foot limited to breakdown of skin: Secondary | ICD-10-CM | POA: Diagnosis not present

## 2018-03-24 DIAGNOSIS — I120 Hypertensive chronic kidney disease with stage 5 chronic kidney disease or end stage renal disease: Secondary | ICD-10-CM | POA: Diagnosis not present

## 2018-03-24 DIAGNOSIS — E1142 Type 2 diabetes mellitus with diabetic polyneuropathy: Secondary | ICD-10-CM | POA: Diagnosis not present

## 2018-03-24 DIAGNOSIS — E11621 Type 2 diabetes mellitus with foot ulcer: Secondary | ICD-10-CM | POA: Diagnosis not present

## 2018-03-24 DIAGNOSIS — N189 Chronic kidney disease, unspecified: Secondary | ICD-10-CM | POA: Diagnosis not present

## 2018-03-24 DIAGNOSIS — E1122 Type 2 diabetes mellitus with diabetic chronic kidney disease: Secondary | ICD-10-CM | POA: Diagnosis not present

## 2018-03-24 DIAGNOSIS — L97511 Non-pressure chronic ulcer of other part of right foot limited to breakdown of skin: Secondary | ICD-10-CM | POA: Diagnosis not present

## 2018-03-25 DIAGNOSIS — L97418 Non-pressure chronic ulcer of right heel and midfoot with other specified severity: Secondary | ICD-10-CM | POA: Diagnosis not present

## 2018-03-25 DIAGNOSIS — E11621 Type 2 diabetes mellitus with foot ulcer: Secondary | ICD-10-CM | POA: Diagnosis not present

## 2018-03-25 DIAGNOSIS — L97513 Non-pressure chronic ulcer of other part of right foot with necrosis of muscle: Secondary | ICD-10-CM | POA: Diagnosis not present

## 2018-03-30 DIAGNOSIS — E11621 Type 2 diabetes mellitus with foot ulcer: Secondary | ICD-10-CM | POA: Diagnosis not present

## 2018-03-30 DIAGNOSIS — E1142 Type 2 diabetes mellitus with diabetic polyneuropathy: Secondary | ICD-10-CM | POA: Diagnosis not present

## 2018-03-30 DIAGNOSIS — I120 Hypertensive chronic kidney disease with stage 5 chronic kidney disease or end stage renal disease: Secondary | ICD-10-CM | POA: Diagnosis not present

## 2018-03-30 DIAGNOSIS — E1122 Type 2 diabetes mellitus with diabetic chronic kidney disease: Secondary | ICD-10-CM | POA: Diagnosis not present

## 2018-03-30 DIAGNOSIS — L97511 Non-pressure chronic ulcer of other part of right foot limited to breakdown of skin: Secondary | ICD-10-CM | POA: Diagnosis not present

## 2018-03-30 DIAGNOSIS — N189 Chronic kidney disease, unspecified: Secondary | ICD-10-CM | POA: Diagnosis not present

## 2018-04-02 DIAGNOSIS — L97511 Non-pressure chronic ulcer of other part of right foot limited to breakdown of skin: Secondary | ICD-10-CM | POA: Diagnosis not present

## 2018-04-02 DIAGNOSIS — E1142 Type 2 diabetes mellitus with diabetic polyneuropathy: Secondary | ICD-10-CM | POA: Diagnosis not present

## 2018-04-02 DIAGNOSIS — E11621 Type 2 diabetes mellitus with foot ulcer: Secondary | ICD-10-CM | POA: Diagnosis not present

## 2018-04-02 DIAGNOSIS — N189 Chronic kidney disease, unspecified: Secondary | ICD-10-CM | POA: Diagnosis not present

## 2018-04-02 DIAGNOSIS — E1122 Type 2 diabetes mellitus with diabetic chronic kidney disease: Secondary | ICD-10-CM | POA: Diagnosis not present

## 2018-04-02 DIAGNOSIS — I120 Hypertensive chronic kidney disease with stage 5 chronic kidney disease or end stage renal disease: Secondary | ICD-10-CM | POA: Diagnosis not present

## 2018-04-05 DIAGNOSIS — E1122 Type 2 diabetes mellitus with diabetic chronic kidney disease: Secondary | ICD-10-CM | POA: Diagnosis not present

## 2018-04-05 DIAGNOSIS — E1142 Type 2 diabetes mellitus with diabetic polyneuropathy: Secondary | ICD-10-CM | POA: Diagnosis not present

## 2018-04-05 DIAGNOSIS — L97511 Non-pressure chronic ulcer of other part of right foot limited to breakdown of skin: Secondary | ICD-10-CM | POA: Diagnosis not present

## 2018-04-05 DIAGNOSIS — I120 Hypertensive chronic kidney disease with stage 5 chronic kidney disease or end stage renal disease: Secondary | ICD-10-CM | POA: Diagnosis not present

## 2018-04-05 DIAGNOSIS — N189 Chronic kidney disease, unspecified: Secondary | ICD-10-CM | POA: Diagnosis not present

## 2018-04-05 DIAGNOSIS — E11621 Type 2 diabetes mellitus with foot ulcer: Secondary | ICD-10-CM | POA: Diagnosis not present

## 2018-04-07 DIAGNOSIS — E11621 Type 2 diabetes mellitus with foot ulcer: Secondary | ICD-10-CM | POA: Diagnosis not present

## 2018-04-07 DIAGNOSIS — L97418 Non-pressure chronic ulcer of right heel and midfoot with other specified severity: Secondary | ICD-10-CM | POA: Diagnosis not present

## 2018-04-07 DIAGNOSIS — L97513 Non-pressure chronic ulcer of other part of right foot with necrosis of muscle: Secondary | ICD-10-CM | POA: Diagnosis not present

## 2018-04-08 DIAGNOSIS — L97511 Non-pressure chronic ulcer of other part of right foot limited to breakdown of skin: Secondary | ICD-10-CM | POA: Diagnosis not present

## 2018-04-08 DIAGNOSIS — N189 Chronic kidney disease, unspecified: Secondary | ICD-10-CM | POA: Diagnosis not present

## 2018-04-08 DIAGNOSIS — E1122 Type 2 diabetes mellitus with diabetic chronic kidney disease: Secondary | ICD-10-CM | POA: Diagnosis not present

## 2018-04-08 DIAGNOSIS — I120 Hypertensive chronic kidney disease with stage 5 chronic kidney disease or end stage renal disease: Secondary | ICD-10-CM | POA: Diagnosis not present

## 2018-04-08 DIAGNOSIS — E11621 Type 2 diabetes mellitus with foot ulcer: Secondary | ICD-10-CM | POA: Diagnosis not present

## 2018-04-08 DIAGNOSIS — E1142 Type 2 diabetes mellitus with diabetic polyneuropathy: Secondary | ICD-10-CM | POA: Diagnosis not present

## 2018-04-09 DIAGNOSIS — F419 Anxiety disorder, unspecified: Secondary | ICD-10-CM | POA: Diagnosis not present

## 2018-04-09 DIAGNOSIS — L97511 Non-pressure chronic ulcer of other part of right foot limited to breakdown of skin: Secondary | ICD-10-CM | POA: Diagnosis not present

## 2018-04-09 DIAGNOSIS — I129 Hypertensive chronic kidney disease with stage 1 through stage 4 chronic kidney disease, or unspecified chronic kidney disease: Secondary | ICD-10-CM | POA: Diagnosis not present

## 2018-04-09 DIAGNOSIS — N189 Chronic kidney disease, unspecified: Secondary | ICD-10-CM | POA: Diagnosis not present

## 2018-04-09 DIAGNOSIS — F329 Major depressive disorder, single episode, unspecified: Secondary | ICD-10-CM | POA: Diagnosis not present

## 2018-04-09 DIAGNOSIS — E1142 Type 2 diabetes mellitus with diabetic polyneuropathy: Secondary | ICD-10-CM | POA: Diagnosis not present

## 2018-04-09 DIAGNOSIS — E1122 Type 2 diabetes mellitus with diabetic chronic kidney disease: Secondary | ICD-10-CM | POA: Diagnosis not present

## 2018-04-09 DIAGNOSIS — Z7984 Long term (current) use of oral hypoglycemic drugs: Secondary | ICD-10-CM | POA: Diagnosis not present

## 2018-04-09 DIAGNOSIS — Z89512 Acquired absence of left leg below knee: Secondary | ICD-10-CM | POA: Diagnosis not present

## 2018-04-09 DIAGNOSIS — E11621 Type 2 diabetes mellitus with foot ulcer: Secondary | ICD-10-CM | POA: Diagnosis not present

## 2018-04-20 DIAGNOSIS — L97511 Non-pressure chronic ulcer of other part of right foot limited to breakdown of skin: Secondary | ICD-10-CM | POA: Diagnosis not present

## 2018-04-20 DIAGNOSIS — E11621 Type 2 diabetes mellitus with foot ulcer: Secondary | ICD-10-CM | POA: Diagnosis not present

## 2018-04-20 DIAGNOSIS — E1122 Type 2 diabetes mellitus with diabetic chronic kidney disease: Secondary | ICD-10-CM | POA: Diagnosis not present

## 2018-04-20 DIAGNOSIS — I129 Hypertensive chronic kidney disease with stage 1 through stage 4 chronic kidney disease, or unspecified chronic kidney disease: Secondary | ICD-10-CM | POA: Diagnosis not present

## 2018-04-20 DIAGNOSIS — N189 Chronic kidney disease, unspecified: Secondary | ICD-10-CM | POA: Diagnosis not present

## 2018-04-20 DIAGNOSIS — E1142 Type 2 diabetes mellitus with diabetic polyneuropathy: Secondary | ICD-10-CM | POA: Diagnosis not present

## 2018-04-23 DIAGNOSIS — E1122 Type 2 diabetes mellitus with diabetic chronic kidney disease: Secondary | ICD-10-CM | POA: Diagnosis not present

## 2018-04-23 DIAGNOSIS — E11621 Type 2 diabetes mellitus with foot ulcer: Secondary | ICD-10-CM | POA: Diagnosis not present

## 2018-04-23 DIAGNOSIS — L97511 Non-pressure chronic ulcer of other part of right foot limited to breakdown of skin: Secondary | ICD-10-CM | POA: Diagnosis not present

## 2018-04-23 DIAGNOSIS — I129 Hypertensive chronic kidney disease with stage 1 through stage 4 chronic kidney disease, or unspecified chronic kidney disease: Secondary | ICD-10-CM | POA: Diagnosis not present

## 2018-04-23 DIAGNOSIS — N189 Chronic kidney disease, unspecified: Secondary | ICD-10-CM | POA: Diagnosis not present

## 2018-04-23 DIAGNOSIS — E1142 Type 2 diabetes mellitus with diabetic polyneuropathy: Secondary | ICD-10-CM | POA: Diagnosis not present

## 2018-04-26 DIAGNOSIS — N189 Chronic kidney disease, unspecified: Secondary | ICD-10-CM | POA: Diagnosis not present

## 2018-04-26 DIAGNOSIS — E1122 Type 2 diabetes mellitus with diabetic chronic kidney disease: Secondary | ICD-10-CM | POA: Diagnosis not present

## 2018-04-26 DIAGNOSIS — I129 Hypertensive chronic kidney disease with stage 1 through stage 4 chronic kidney disease, or unspecified chronic kidney disease: Secondary | ICD-10-CM | POA: Diagnosis not present

## 2018-04-26 DIAGNOSIS — E1142 Type 2 diabetes mellitus with diabetic polyneuropathy: Secondary | ICD-10-CM | POA: Diagnosis not present

## 2018-04-26 DIAGNOSIS — E11621 Type 2 diabetes mellitus with foot ulcer: Secondary | ICD-10-CM | POA: Diagnosis not present

## 2018-04-26 DIAGNOSIS — L97511 Non-pressure chronic ulcer of other part of right foot limited to breakdown of skin: Secondary | ICD-10-CM | POA: Diagnosis not present

## 2018-05-04 DIAGNOSIS — L97512 Non-pressure chronic ulcer of other part of right foot with fat layer exposed: Secondary | ICD-10-CM | POA: Diagnosis not present

## 2018-05-04 DIAGNOSIS — Z6837 Body mass index (BMI) 37.0-37.9, adult: Secondary | ICD-10-CM | POA: Diagnosis not present

## 2018-05-04 DIAGNOSIS — N183 Chronic kidney disease, stage 3 (moderate): Secondary | ICD-10-CM | POA: Diagnosis not present

## 2018-05-04 DIAGNOSIS — S88112S Complete traumatic amputation at level between knee and ankle, left lower leg, sequela: Secondary | ICD-10-CM | POA: Diagnosis not present

## 2018-05-04 DIAGNOSIS — L84 Corns and callosities: Secondary | ICD-10-CM | POA: Diagnosis not present

## 2018-05-04 DIAGNOSIS — E11621 Type 2 diabetes mellitus with foot ulcer: Secondary | ICD-10-CM | POA: Diagnosis not present

## 2018-05-04 DIAGNOSIS — I10 Essential (primary) hypertension: Secondary | ICD-10-CM | POA: Diagnosis not present

## 2018-05-04 DIAGNOSIS — E1122 Type 2 diabetes mellitus with diabetic chronic kidney disease: Secondary | ICD-10-CM | POA: Diagnosis not present

## 2018-05-04 DIAGNOSIS — Z299 Encounter for prophylactic measures, unspecified: Secondary | ICD-10-CM | POA: Diagnosis not present

## 2018-05-04 DIAGNOSIS — L97518 Non-pressure chronic ulcer of other part of right foot with other specified severity: Secondary | ICD-10-CM | POA: Diagnosis not present

## 2018-05-04 DIAGNOSIS — E1165 Type 2 diabetes mellitus with hyperglycemia: Secondary | ICD-10-CM | POA: Diagnosis not present

## 2018-05-07 DIAGNOSIS — E11621 Type 2 diabetes mellitus with foot ulcer: Secondary | ICD-10-CM | POA: Diagnosis not present

## 2018-05-07 DIAGNOSIS — L97511 Non-pressure chronic ulcer of other part of right foot limited to breakdown of skin: Secondary | ICD-10-CM | POA: Diagnosis not present

## 2018-05-07 DIAGNOSIS — N189 Chronic kidney disease, unspecified: Secondary | ICD-10-CM | POA: Diagnosis not present

## 2018-05-07 DIAGNOSIS — I129 Hypertensive chronic kidney disease with stage 1 through stage 4 chronic kidney disease, or unspecified chronic kidney disease: Secondary | ICD-10-CM | POA: Diagnosis not present

## 2018-05-07 DIAGNOSIS — E1142 Type 2 diabetes mellitus with diabetic polyneuropathy: Secondary | ICD-10-CM | POA: Diagnosis not present

## 2018-05-07 DIAGNOSIS — E1122 Type 2 diabetes mellitus with diabetic chronic kidney disease: Secondary | ICD-10-CM | POA: Diagnosis not present

## 2018-07-06 DIAGNOSIS — Z7189 Other specified counseling: Secondary | ICD-10-CM | POA: Diagnosis not present

## 2018-07-06 DIAGNOSIS — Z6835 Body mass index (BMI) 35.0-35.9, adult: Secondary | ICD-10-CM | POA: Diagnosis not present

## 2018-07-06 DIAGNOSIS — E782 Mixed hyperlipidemia: Secondary | ICD-10-CM | POA: Diagnosis not present

## 2018-07-06 DIAGNOSIS — Z1339 Encounter for screening examination for other mental health and behavioral disorders: Secondary | ICD-10-CM | POA: Diagnosis not present

## 2018-07-06 DIAGNOSIS — Z0000 Encounter for general adult medical examination without abnormal findings: Secondary | ICD-10-CM | POA: Diagnosis not present

## 2018-07-06 DIAGNOSIS — E1165 Type 2 diabetes mellitus with hyperglycemia: Secondary | ICD-10-CM | POA: Diagnosis not present

## 2018-07-06 DIAGNOSIS — Z1331 Encounter for screening for depression: Secondary | ICD-10-CM | POA: Diagnosis not present

## 2018-07-06 DIAGNOSIS — Z1211 Encounter for screening for malignant neoplasm of colon: Secondary | ICD-10-CM | POA: Diagnosis not present

## 2018-07-06 DIAGNOSIS — Z299 Encounter for prophylactic measures, unspecified: Secondary | ICD-10-CM | POA: Diagnosis not present

## 2018-07-06 DIAGNOSIS — R5383 Other fatigue: Secondary | ICD-10-CM | POA: Diagnosis not present

## 2018-08-12 DIAGNOSIS — E119 Type 2 diabetes mellitus without complications: Secondary | ICD-10-CM | POA: Diagnosis not present

## 2018-08-12 DIAGNOSIS — E7800 Pure hypercholesterolemia, unspecified: Secondary | ICD-10-CM | POA: Diagnosis not present

## 2018-09-13 DIAGNOSIS — E1122 Type 2 diabetes mellitus with diabetic chronic kidney disease: Secondary | ICD-10-CM | POA: Diagnosis not present

## 2018-09-13 DIAGNOSIS — L0390 Cellulitis, unspecified: Secondary | ICD-10-CM | POA: Diagnosis not present

## 2018-09-13 DIAGNOSIS — I10 Essential (primary) hypertension: Secondary | ICD-10-CM | POA: Diagnosis not present

## 2018-09-13 DIAGNOSIS — Z125 Encounter for screening for malignant neoplasm of prostate: Secondary | ICD-10-CM | POA: Diagnosis not present

## 2018-09-13 DIAGNOSIS — Z79899 Other long term (current) drug therapy: Secondary | ICD-10-CM | POA: Diagnosis not present

## 2018-09-13 DIAGNOSIS — E782 Mixed hyperlipidemia: Secondary | ICD-10-CM | POA: Diagnosis not present

## 2018-09-13 DIAGNOSIS — Z6835 Body mass index (BMI) 35.0-35.9, adult: Secondary | ICD-10-CM | POA: Diagnosis not present

## 2018-09-13 DIAGNOSIS — L0291 Cutaneous abscess, unspecified: Secondary | ICD-10-CM | POA: Diagnosis not present

## 2018-09-13 DIAGNOSIS — R5383 Other fatigue: Secondary | ICD-10-CM | POA: Diagnosis not present

## 2018-09-13 DIAGNOSIS — E1165 Type 2 diabetes mellitus with hyperglycemia: Secondary | ICD-10-CM | POA: Diagnosis not present

## 2018-09-13 DIAGNOSIS — Z299 Encounter for prophylactic measures, unspecified: Secondary | ICD-10-CM | POA: Diagnosis not present

## 2018-10-01 DIAGNOSIS — E1122 Type 2 diabetes mellitus with diabetic chronic kidney disease: Secondary | ICD-10-CM | POA: Diagnosis not present

## 2018-10-01 DIAGNOSIS — Z6835 Body mass index (BMI) 35.0-35.9, adult: Secondary | ICD-10-CM | POA: Diagnosis not present

## 2018-10-01 DIAGNOSIS — L0291 Cutaneous abscess, unspecified: Secondary | ICD-10-CM | POA: Diagnosis not present

## 2018-10-01 DIAGNOSIS — I10 Essential (primary) hypertension: Secondary | ICD-10-CM | POA: Diagnosis not present

## 2018-10-01 DIAGNOSIS — L0390 Cellulitis, unspecified: Secondary | ICD-10-CM | POA: Diagnosis not present

## 2018-10-01 DIAGNOSIS — N183 Chronic kidney disease, stage 3 (moderate): Secondary | ICD-10-CM | POA: Diagnosis not present

## 2018-10-01 DIAGNOSIS — Z299 Encounter for prophylactic measures, unspecified: Secondary | ICD-10-CM | POA: Diagnosis not present

## 2018-10-05 DIAGNOSIS — M86171 Other acute osteomyelitis, right ankle and foot: Secondary | ICD-10-CM | POA: Diagnosis not present

## 2018-10-05 DIAGNOSIS — L03115 Cellulitis of right lower limb: Secondary | ICD-10-CM | POA: Diagnosis not present

## 2018-10-05 DIAGNOSIS — E1151 Type 2 diabetes mellitus with diabetic peripheral angiopathy without gangrene: Secondary | ICD-10-CM | POA: Diagnosis present

## 2018-10-05 DIAGNOSIS — N400 Enlarged prostate without lower urinary tract symptoms: Secondary | ICD-10-CM | POA: Diagnosis present

## 2018-10-05 DIAGNOSIS — E11628 Type 2 diabetes mellitus with other skin complications: Secondary | ICD-10-CM | POA: Diagnosis not present

## 2018-10-05 DIAGNOSIS — Z7984 Long term (current) use of oral hypoglycemic drugs: Secondary | ICD-10-CM | POA: Diagnosis not present

## 2018-10-05 DIAGNOSIS — Z79899 Other long term (current) drug therapy: Secondary | ICD-10-CM | POA: Diagnosis not present

## 2018-10-05 DIAGNOSIS — E11621 Type 2 diabetes mellitus with foot ulcer: Secondary | ICD-10-CM | POA: Diagnosis not present

## 2018-10-05 DIAGNOSIS — F419 Anxiety disorder, unspecified: Secondary | ICD-10-CM | POA: Diagnosis present

## 2018-10-05 DIAGNOSIS — Z89512 Acquired absence of left leg below knee: Secondary | ICD-10-CM | POA: Diagnosis not present

## 2018-10-05 DIAGNOSIS — E669 Obesity, unspecified: Secondary | ICD-10-CM | POA: Diagnosis not present

## 2018-10-05 DIAGNOSIS — Z23 Encounter for immunization: Secondary | ICD-10-CM | POA: Diagnosis not present

## 2018-10-05 DIAGNOSIS — C61 Malignant neoplasm of prostate: Secondary | ICD-10-CM | POA: Diagnosis present

## 2018-10-05 DIAGNOSIS — G4733 Obstructive sleep apnea (adult) (pediatric): Secondary | ICD-10-CM | POA: Diagnosis not present

## 2018-10-05 DIAGNOSIS — E1169 Type 2 diabetes mellitus with other specified complication: Secondary | ICD-10-CM | POA: Diagnosis present

## 2018-10-05 DIAGNOSIS — L97519 Non-pressure chronic ulcer of other part of right foot with unspecified severity: Secondary | ICD-10-CM | POA: Diagnosis not present

## 2018-10-05 DIAGNOSIS — I10 Essential (primary) hypertension: Secondary | ICD-10-CM | POA: Diagnosis not present

## 2018-10-22 DIAGNOSIS — Z299 Encounter for prophylactic measures, unspecified: Secondary | ICD-10-CM | POA: Diagnosis not present

## 2018-10-22 DIAGNOSIS — M869 Osteomyelitis, unspecified: Secondary | ICD-10-CM | POA: Diagnosis not present

## 2018-10-22 DIAGNOSIS — Z6835 Body mass index (BMI) 35.0-35.9, adult: Secondary | ICD-10-CM | POA: Diagnosis not present

## 2018-10-22 DIAGNOSIS — E1165 Type 2 diabetes mellitus with hyperglycemia: Secondary | ICD-10-CM | POA: Diagnosis not present

## 2018-10-22 DIAGNOSIS — L97509 Non-pressure chronic ulcer of other part of unspecified foot with unspecified severity: Secondary | ICD-10-CM | POA: Diagnosis not present

## 2018-10-22 DIAGNOSIS — I10 Essential (primary) hypertension: Secondary | ICD-10-CM | POA: Diagnosis not present

## 2018-10-22 DIAGNOSIS — E1169 Type 2 diabetes mellitus with other specified complication: Secondary | ICD-10-CM | POA: Diagnosis not present

## 2018-10-22 DIAGNOSIS — E11621 Type 2 diabetes mellitus with foot ulcer: Secondary | ICD-10-CM | POA: Diagnosis not present

## 2018-10-22 DIAGNOSIS — Z789 Other specified health status: Secondary | ICD-10-CM | POA: Diagnosis not present

## 2018-10-23 DIAGNOSIS — F419 Anxiety disorder, unspecified: Secondary | ICD-10-CM | POA: Diagnosis not present

## 2018-10-23 DIAGNOSIS — N400 Enlarged prostate without lower urinary tract symptoms: Secondary | ICD-10-CM | POA: Diagnosis not present

## 2018-10-23 DIAGNOSIS — Z89512 Acquired absence of left leg below knee: Secondary | ICD-10-CM | POA: Diagnosis not present

## 2018-10-23 DIAGNOSIS — E11621 Type 2 diabetes mellitus with foot ulcer: Secondary | ICD-10-CM | POA: Diagnosis not present

## 2018-10-23 DIAGNOSIS — L03115 Cellulitis of right lower limb: Secondary | ICD-10-CM | POA: Diagnosis not present

## 2018-10-23 DIAGNOSIS — I10 Essential (primary) hypertension: Secondary | ICD-10-CM | POA: Diagnosis not present

## 2018-10-23 DIAGNOSIS — E1151 Type 2 diabetes mellitus with diabetic peripheral angiopathy without gangrene: Secondary | ICD-10-CM | POA: Diagnosis not present

## 2018-10-23 DIAGNOSIS — G4733 Obstructive sleep apnea (adult) (pediatric): Secondary | ICD-10-CM | POA: Diagnosis not present

## 2018-10-23 DIAGNOSIS — Z8546 Personal history of malignant neoplasm of prostate: Secondary | ICD-10-CM | POA: Diagnosis not present

## 2018-10-23 DIAGNOSIS — E669 Obesity, unspecified: Secondary | ICD-10-CM | POA: Diagnosis not present

## 2018-10-23 DIAGNOSIS — Z8739 Personal history of other diseases of the musculoskeletal system and connective tissue: Secondary | ICD-10-CM | POA: Diagnosis not present

## 2018-10-23 DIAGNOSIS — F339 Major depressive disorder, recurrent, unspecified: Secondary | ICD-10-CM | POA: Diagnosis not present

## 2018-10-23 DIAGNOSIS — L97819 Non-pressure chronic ulcer of other part of right lower leg with unspecified severity: Secondary | ICD-10-CM | POA: Diagnosis not present

## 2018-10-27 DIAGNOSIS — E11621 Type 2 diabetes mellitus with foot ulcer: Secondary | ICD-10-CM | POA: Diagnosis not present

## 2018-10-27 DIAGNOSIS — L03115 Cellulitis of right lower limb: Secondary | ICD-10-CM | POA: Diagnosis not present

## 2018-10-27 DIAGNOSIS — E1151 Type 2 diabetes mellitus with diabetic peripheral angiopathy without gangrene: Secondary | ICD-10-CM | POA: Diagnosis not present

## 2018-10-27 DIAGNOSIS — L97819 Non-pressure chronic ulcer of other part of right lower leg with unspecified severity: Secondary | ICD-10-CM | POA: Diagnosis not present

## 2018-10-27 DIAGNOSIS — G4733 Obstructive sleep apnea (adult) (pediatric): Secondary | ICD-10-CM | POA: Diagnosis not present

## 2018-10-27 DIAGNOSIS — I10 Essential (primary) hypertension: Secondary | ICD-10-CM | POA: Diagnosis not present

## 2018-10-29 DIAGNOSIS — L97518 Non-pressure chronic ulcer of other part of right foot with other specified severity: Secondary | ICD-10-CM | POA: Diagnosis not present

## 2018-10-29 DIAGNOSIS — L97513 Non-pressure chronic ulcer of other part of right foot with necrosis of muscle: Secondary | ICD-10-CM | POA: Diagnosis not present

## 2018-10-29 DIAGNOSIS — E11621 Type 2 diabetes mellitus with foot ulcer: Secondary | ICD-10-CM | POA: Diagnosis not present

## 2018-11-02 DIAGNOSIS — L97819 Non-pressure chronic ulcer of other part of right lower leg with unspecified severity: Secondary | ICD-10-CM | POA: Diagnosis not present

## 2018-11-02 DIAGNOSIS — L03115 Cellulitis of right lower limb: Secondary | ICD-10-CM | POA: Diagnosis not present

## 2018-11-02 DIAGNOSIS — E1151 Type 2 diabetes mellitus with diabetic peripheral angiopathy without gangrene: Secondary | ICD-10-CM | POA: Diagnosis not present

## 2018-11-02 DIAGNOSIS — E11621 Type 2 diabetes mellitus with foot ulcer: Secondary | ICD-10-CM | POA: Diagnosis not present

## 2018-11-02 DIAGNOSIS — I10 Essential (primary) hypertension: Secondary | ICD-10-CM | POA: Diagnosis not present

## 2018-11-02 DIAGNOSIS — G4733 Obstructive sleep apnea (adult) (pediatric): Secondary | ICD-10-CM | POA: Diagnosis not present

## 2018-11-05 DIAGNOSIS — L97518 Non-pressure chronic ulcer of other part of right foot with other specified severity: Secondary | ICD-10-CM | POA: Diagnosis not present

## 2018-11-05 DIAGNOSIS — L97513 Non-pressure chronic ulcer of other part of right foot with necrosis of muscle: Secondary | ICD-10-CM | POA: Diagnosis not present

## 2018-11-05 DIAGNOSIS — E11621 Type 2 diabetes mellitus with foot ulcer: Secondary | ICD-10-CM | POA: Diagnosis not present

## 2018-11-08 DIAGNOSIS — G4733 Obstructive sleep apnea (adult) (pediatric): Secondary | ICD-10-CM | POA: Diagnosis not present

## 2018-11-08 DIAGNOSIS — E11621 Type 2 diabetes mellitus with foot ulcer: Secondary | ICD-10-CM | POA: Diagnosis not present

## 2018-11-08 DIAGNOSIS — I10 Essential (primary) hypertension: Secondary | ICD-10-CM | POA: Diagnosis not present

## 2018-11-08 DIAGNOSIS — L97819 Non-pressure chronic ulcer of other part of right lower leg with unspecified severity: Secondary | ICD-10-CM | POA: Diagnosis not present

## 2018-11-08 DIAGNOSIS — E1151 Type 2 diabetes mellitus with diabetic peripheral angiopathy without gangrene: Secondary | ICD-10-CM | POA: Diagnosis not present

## 2018-11-08 DIAGNOSIS — L03115 Cellulitis of right lower limb: Secondary | ICD-10-CM | POA: Diagnosis not present

## 2018-11-12 DIAGNOSIS — E11621 Type 2 diabetes mellitus with foot ulcer: Secondary | ICD-10-CM | POA: Diagnosis not present

## 2018-11-12 DIAGNOSIS — L97518 Non-pressure chronic ulcer of other part of right foot with other specified severity: Secondary | ICD-10-CM | POA: Diagnosis not present

## 2018-11-12 DIAGNOSIS — L97513 Non-pressure chronic ulcer of other part of right foot with necrosis of muscle: Secondary | ICD-10-CM | POA: Diagnosis not present

## 2018-11-16 DIAGNOSIS — L03115 Cellulitis of right lower limb: Secondary | ICD-10-CM | POA: Diagnosis not present

## 2018-11-16 DIAGNOSIS — E11621 Type 2 diabetes mellitus with foot ulcer: Secondary | ICD-10-CM | POA: Diagnosis not present

## 2018-11-16 DIAGNOSIS — E1151 Type 2 diabetes mellitus with diabetic peripheral angiopathy without gangrene: Secondary | ICD-10-CM | POA: Diagnosis not present

## 2018-11-16 DIAGNOSIS — L97819 Non-pressure chronic ulcer of other part of right lower leg with unspecified severity: Secondary | ICD-10-CM | POA: Diagnosis not present

## 2018-11-16 DIAGNOSIS — G4733 Obstructive sleep apnea (adult) (pediatric): Secondary | ICD-10-CM | POA: Diagnosis not present

## 2018-11-16 DIAGNOSIS — I10 Essential (primary) hypertension: Secondary | ICD-10-CM | POA: Diagnosis not present

## 2018-11-19 DIAGNOSIS — L97518 Non-pressure chronic ulcer of other part of right foot with other specified severity: Secondary | ICD-10-CM | POA: Diagnosis not present

## 2018-11-19 DIAGNOSIS — L97513 Non-pressure chronic ulcer of other part of right foot with necrosis of muscle: Secondary | ICD-10-CM | POA: Diagnosis not present

## 2018-11-19 DIAGNOSIS — E11621 Type 2 diabetes mellitus with foot ulcer: Secondary | ICD-10-CM | POA: Diagnosis not present

## 2018-11-26 DIAGNOSIS — L97518 Non-pressure chronic ulcer of other part of right foot with other specified severity: Secondary | ICD-10-CM | POA: Diagnosis not present

## 2018-11-26 DIAGNOSIS — E11621 Type 2 diabetes mellitus with foot ulcer: Secondary | ICD-10-CM | POA: Diagnosis not present

## 2018-11-26 DIAGNOSIS — E1169 Type 2 diabetes mellitus with other specified complication: Secondary | ICD-10-CM | POA: Diagnosis not present

## 2018-11-26 DIAGNOSIS — L97513 Non-pressure chronic ulcer of other part of right foot with necrosis of muscle: Secondary | ICD-10-CM | POA: Diagnosis not present

## 2018-11-26 DIAGNOSIS — M868X8 Other osteomyelitis, other site: Secondary | ICD-10-CM | POA: Diagnosis not present

## 2018-12-03 DIAGNOSIS — Z6837 Body mass index (BMI) 37.0-37.9, adult: Secondary | ICD-10-CM | POA: Diagnosis not present

## 2018-12-03 DIAGNOSIS — E1122 Type 2 diabetes mellitus with diabetic chronic kidney disease: Secondary | ICD-10-CM | POA: Diagnosis not present

## 2018-12-03 DIAGNOSIS — L97513 Non-pressure chronic ulcer of other part of right foot with necrosis of muscle: Secondary | ICD-10-CM | POA: Diagnosis not present

## 2018-12-03 DIAGNOSIS — N183 Chronic kidney disease, stage 3 (moderate): Secondary | ICD-10-CM | POA: Diagnosis not present

## 2018-12-03 DIAGNOSIS — F419 Anxiety disorder, unspecified: Secondary | ICD-10-CM | POA: Diagnosis not present

## 2018-12-03 DIAGNOSIS — E11621 Type 2 diabetes mellitus with foot ulcer: Secondary | ICD-10-CM | POA: Diagnosis not present

## 2018-12-03 DIAGNOSIS — I10 Essential (primary) hypertension: Secondary | ICD-10-CM | POA: Diagnosis not present

## 2018-12-03 DIAGNOSIS — Z299 Encounter for prophylactic measures, unspecified: Secondary | ICD-10-CM | POA: Diagnosis not present

## 2018-12-03 DIAGNOSIS — E1169 Type 2 diabetes mellitus with other specified complication: Secondary | ICD-10-CM | POA: Diagnosis not present

## 2018-12-03 DIAGNOSIS — E1165 Type 2 diabetes mellitus with hyperglycemia: Secondary | ICD-10-CM | POA: Diagnosis not present

## 2018-12-03 DIAGNOSIS — L97518 Non-pressure chronic ulcer of other part of right foot with other specified severity: Secondary | ICD-10-CM | POA: Diagnosis not present

## 2018-12-03 DIAGNOSIS — M868X8 Other osteomyelitis, other site: Secondary | ICD-10-CM | POA: Diagnosis not present

## 2018-12-08 DIAGNOSIS — Z452 Encounter for adjustment and management of vascular access device: Secondary | ICD-10-CM | POA: Diagnosis not present

## 2018-12-08 DIAGNOSIS — L03115 Cellulitis of right lower limb: Secondary | ICD-10-CM | POA: Diagnosis not present

## 2018-12-08 DIAGNOSIS — R5381 Other malaise: Secondary | ICD-10-CM | POA: Diagnosis not present

## 2018-12-08 DIAGNOSIS — E11628 Type 2 diabetes mellitus with other skin complications: Secondary | ICD-10-CM | POA: Diagnosis not present

## 2018-12-08 DIAGNOSIS — E1140 Type 2 diabetes mellitus with diabetic neuropathy, unspecified: Secondary | ICD-10-CM | POA: Diagnosis not present

## 2018-12-08 DIAGNOSIS — M86172 Other acute osteomyelitis, left ankle and foot: Secondary | ICD-10-CM | POA: Diagnosis not present

## 2018-12-08 DIAGNOSIS — L97519 Non-pressure chronic ulcer of other part of right foot with unspecified severity: Secondary | ICD-10-CM | POA: Diagnosis not present

## 2018-12-08 DIAGNOSIS — Z89612 Acquired absence of left leg above knee: Secondary | ICD-10-CM | POA: Diagnosis not present

## 2018-12-08 DIAGNOSIS — E1165 Type 2 diabetes mellitus with hyperglycemia: Secondary | ICD-10-CM | POA: Diagnosis not present

## 2018-12-08 DIAGNOSIS — L03031 Cellulitis of right toe: Secondary | ICD-10-CM | POA: Diagnosis not present

## 2018-12-08 DIAGNOSIS — M869 Osteomyelitis, unspecified: Secondary | ICD-10-CM | POA: Diagnosis not present

## 2018-12-08 DIAGNOSIS — Z85828 Personal history of other malignant neoplasm of skin: Secondary | ICD-10-CM | POA: Diagnosis not present

## 2018-12-08 DIAGNOSIS — I10 Essential (primary) hypertension: Secondary | ICD-10-CM | POA: Diagnosis present

## 2018-12-08 DIAGNOSIS — Z8546 Personal history of malignant neoplasm of prostate: Secondary | ICD-10-CM | POA: Diagnosis not present

## 2018-12-08 DIAGNOSIS — E1142 Type 2 diabetes mellitus with diabetic polyneuropathy: Secondary | ICD-10-CM | POA: Diagnosis not present

## 2018-12-08 DIAGNOSIS — E11621 Type 2 diabetes mellitus with foot ulcer: Secondary | ICD-10-CM | POA: Diagnosis not present

## 2018-12-08 DIAGNOSIS — N400 Enlarged prostate without lower urinary tract symptoms: Secondary | ICD-10-CM | POA: Diagnosis present

## 2018-12-08 DIAGNOSIS — M86171 Other acute osteomyelitis, right ankle and foot: Secondary | ICD-10-CM | POA: Diagnosis not present

## 2018-12-08 DIAGNOSIS — E1169 Type 2 diabetes mellitus with other specified complication: Secondary | ICD-10-CM | POA: Diagnosis present

## 2018-12-08 DIAGNOSIS — M79676 Pain in unspecified toe(s): Secondary | ICD-10-CM | POA: Diagnosis not present

## 2018-12-08 DIAGNOSIS — Z7984 Long term (current) use of oral hypoglycemic drugs: Secondary | ICD-10-CM | POA: Diagnosis not present

## 2018-12-14 DIAGNOSIS — Z452 Encounter for adjustment and management of vascular access device: Secondary | ICD-10-CM | POA: Diagnosis not present

## 2018-12-14 DIAGNOSIS — E1140 Type 2 diabetes mellitus with diabetic neuropathy, unspecified: Secondary | ICD-10-CM | POA: Diagnosis not present

## 2018-12-14 DIAGNOSIS — L03115 Cellulitis of right lower limb: Secondary | ICD-10-CM | POA: Diagnosis not present

## 2018-12-14 DIAGNOSIS — L97512 Non-pressure chronic ulcer of other part of right foot with fat layer exposed: Secondary | ICD-10-CM | POA: Diagnosis not present

## 2018-12-14 DIAGNOSIS — E11621 Type 2 diabetes mellitus with foot ulcer: Secondary | ICD-10-CM | POA: Diagnosis not present

## 2018-12-14 DIAGNOSIS — Z742 Need for assistance at home and no other household member able to render care: Secondary | ICD-10-CM | POA: Diagnosis not present

## 2018-12-14 DIAGNOSIS — E1165 Type 2 diabetes mellitus with hyperglycemia: Secondary | ICD-10-CM | POA: Diagnosis not present

## 2018-12-14 DIAGNOSIS — Z89612 Acquired absence of left leg above knee: Secondary | ICD-10-CM | POA: Diagnosis not present

## 2018-12-14 DIAGNOSIS — N189 Chronic kidney disease, unspecified: Secondary | ICD-10-CM | POA: Diagnosis not present

## 2018-12-14 DIAGNOSIS — E1122 Type 2 diabetes mellitus with diabetic chronic kidney disease: Secondary | ICD-10-CM | POA: Diagnosis not present

## 2018-12-14 DIAGNOSIS — Z4800 Encounter for change or removal of nonsurgical wound dressing: Secondary | ICD-10-CM | POA: Diagnosis not present

## 2018-12-14 DIAGNOSIS — Z792 Long term (current) use of antibiotics: Secondary | ICD-10-CM | POA: Diagnosis not present

## 2018-12-14 DIAGNOSIS — E1169 Type 2 diabetes mellitus with other specified complication: Secondary | ICD-10-CM | POA: Diagnosis not present

## 2018-12-14 DIAGNOSIS — M869 Osteomyelitis, unspecified: Secondary | ICD-10-CM | POA: Diagnosis not present

## 2018-12-14 DIAGNOSIS — L97511 Non-pressure chronic ulcer of other part of right foot limited to breakdown of skin: Secondary | ICD-10-CM | POA: Diagnosis not present

## 2018-12-14 DIAGNOSIS — Z8546 Personal history of malignant neoplasm of prostate: Secondary | ICD-10-CM | POA: Diagnosis not present

## 2018-12-14 DIAGNOSIS — I129 Hypertensive chronic kidney disease with stage 1 through stage 4 chronic kidney disease, or unspecified chronic kidney disease: Secondary | ICD-10-CM | POA: Diagnosis not present

## 2018-12-15 DIAGNOSIS — Z452 Encounter for adjustment and management of vascular access device: Secondary | ICD-10-CM | POA: Diagnosis not present

## 2018-12-15 DIAGNOSIS — E11621 Type 2 diabetes mellitus with foot ulcer: Secondary | ICD-10-CM | POA: Diagnosis not present

## 2018-12-15 DIAGNOSIS — L03115 Cellulitis of right lower limb: Secondary | ICD-10-CM | POA: Diagnosis not present

## 2018-12-15 DIAGNOSIS — Z792 Long term (current) use of antibiotics: Secondary | ICD-10-CM | POA: Diagnosis not present

## 2018-12-15 DIAGNOSIS — E1169 Type 2 diabetes mellitus with other specified complication: Secondary | ICD-10-CM | POA: Diagnosis not present

## 2018-12-15 DIAGNOSIS — M869 Osteomyelitis, unspecified: Secondary | ICD-10-CM | POA: Diagnosis not present

## 2018-12-16 DIAGNOSIS — M869 Osteomyelitis, unspecified: Secondary | ICD-10-CM | POA: Diagnosis not present

## 2018-12-16 DIAGNOSIS — E11621 Type 2 diabetes mellitus with foot ulcer: Secondary | ICD-10-CM | POA: Diagnosis not present

## 2018-12-16 DIAGNOSIS — Z792 Long term (current) use of antibiotics: Secondary | ICD-10-CM | POA: Diagnosis not present

## 2018-12-16 DIAGNOSIS — E1169 Type 2 diabetes mellitus with other specified complication: Secondary | ICD-10-CM | POA: Diagnosis not present

## 2018-12-16 DIAGNOSIS — Z452 Encounter for adjustment and management of vascular access device: Secondary | ICD-10-CM | POA: Diagnosis not present

## 2018-12-16 DIAGNOSIS — L03115 Cellulitis of right lower limb: Secondary | ICD-10-CM | POA: Diagnosis not present

## 2018-12-18 DIAGNOSIS — M869 Osteomyelitis, unspecified: Secondary | ICD-10-CM | POA: Diagnosis not present

## 2018-12-18 DIAGNOSIS — L03115 Cellulitis of right lower limb: Secondary | ICD-10-CM | POA: Diagnosis not present

## 2018-12-18 DIAGNOSIS — Z452 Encounter for adjustment and management of vascular access device: Secondary | ICD-10-CM | POA: Diagnosis not present

## 2018-12-18 DIAGNOSIS — E1169 Type 2 diabetes mellitus with other specified complication: Secondary | ICD-10-CM | POA: Diagnosis not present

## 2018-12-18 DIAGNOSIS — E11621 Type 2 diabetes mellitus with foot ulcer: Secondary | ICD-10-CM | POA: Diagnosis not present

## 2018-12-18 DIAGNOSIS — Z792 Long term (current) use of antibiotics: Secondary | ICD-10-CM | POA: Diagnosis not present

## 2018-12-19 ENCOUNTER — Other Ambulatory Visit (HOSPITAL_COMMUNITY)
Admission: RE | Admit: 2018-12-19 | Discharge: 2018-12-19 | Disposition: A | Discharge status: Home / Self Care | Payer: Medicare Other | Source: Other Acute Inpatient Hospital | Attending: Surgery | Admitting: Surgery

## 2018-12-19 DIAGNOSIS — E1169 Type 2 diabetes mellitus with other specified complication: Secondary | ICD-10-CM | POA: Diagnosis not present

## 2018-12-19 DIAGNOSIS — L97412 Non-pressure chronic ulcer of right heel and midfoot with fat layer exposed: Secondary | ICD-10-CM | POA: Diagnosis not present

## 2018-12-19 DIAGNOSIS — L03115 Cellulitis of right lower limb: Secondary | ICD-10-CM | POA: Diagnosis not present

## 2018-12-19 DIAGNOSIS — Z792 Long term (current) use of antibiotics: Secondary | ICD-10-CM | POA: Diagnosis not present

## 2018-12-19 DIAGNOSIS — M869 Osteomyelitis, unspecified: Secondary | ICD-10-CM | POA: Diagnosis not present

## 2018-12-19 DIAGNOSIS — Z452 Encounter for adjustment and management of vascular access device: Secondary | ICD-10-CM | POA: Diagnosis not present

## 2018-12-19 DIAGNOSIS — E11621 Type 2 diabetes mellitus with foot ulcer: Secondary | ICD-10-CM | POA: Diagnosis not present

## 2018-12-19 LAB — CBC WITH DIFFERENTIAL/PLATELET
ABS IMMATURE GRANULOCYTES: 0.04 10*3/uL (ref 0.00–0.07)
Basophils Absolute: 0.1 10*3/uL (ref 0.0–0.1)
Basophils Relative: 1 %
Eosinophils Absolute: 0.4 10*3/uL (ref 0.0–0.5)
Eosinophils Relative: 4 %
HCT: 41.5 % (ref 39.0–52.0)
Hemoglobin: 13.2 g/dL (ref 13.0–17.0)
IMMATURE GRANULOCYTES: 1 %
LYMPHS ABS: 1.5 10*3/uL (ref 0.7–4.0)
Lymphocytes Relative: 18 %
MCH: 29.4 pg (ref 26.0–34.0)
MCHC: 31.8 g/dL (ref 30.0–36.0)
MCV: 92.4 fL (ref 80.0–100.0)
MONO ABS: 0.6 10*3/uL (ref 0.1–1.0)
Monocytes Relative: 7 %
NEUTROS PCT: 69 %
Neutro Abs: 6 10*3/uL (ref 1.7–7.7)
Platelets: 265 10*3/uL (ref 150–400)
RBC: 4.49 MIL/uL (ref 4.22–5.81)
RDW: 13.6 % (ref 11.5–15.5)
WBC: 8.6 10*3/uL (ref 4.0–10.5)
nRBC: 0 % (ref 0.0–0.2)

## 2018-12-19 LAB — CREATININE, SERUM
Creatinine, Ser: 1.22 mg/dL (ref 0.61–1.24)
GFR, EST NON AFRICAN AMERICAN: 58 mL/min — AB (ref >60)

## 2018-12-19 LAB — BUN: BUN: 27 mg/dL — ABNORMAL HIGH (ref 8–23)

## 2018-12-20 DIAGNOSIS — E1165 Type 2 diabetes mellitus with hyperglycemia: Secondary | ICD-10-CM | POA: Diagnosis not present

## 2018-12-20 DIAGNOSIS — Z792 Long term (current) use of antibiotics: Secondary | ICD-10-CM | POA: Diagnosis not present

## 2018-12-20 DIAGNOSIS — M869 Osteomyelitis, unspecified: Secondary | ICD-10-CM | POA: Diagnosis not present

## 2018-12-20 DIAGNOSIS — Z299 Encounter for prophylactic measures, unspecified: Secondary | ICD-10-CM | POA: Diagnosis not present

## 2018-12-20 DIAGNOSIS — E11621 Type 2 diabetes mellitus with foot ulcer: Secondary | ICD-10-CM | POA: Diagnosis not present

## 2018-12-20 DIAGNOSIS — Z452 Encounter for adjustment and management of vascular access device: Secondary | ICD-10-CM | POA: Diagnosis not present

## 2018-12-20 DIAGNOSIS — L97509 Non-pressure chronic ulcer of other part of unspecified foot with unspecified severity: Secondary | ICD-10-CM | POA: Diagnosis not present

## 2018-12-20 DIAGNOSIS — E1169 Type 2 diabetes mellitus with other specified complication: Secondary | ICD-10-CM | POA: Diagnosis not present

## 2018-12-20 DIAGNOSIS — L03115 Cellulitis of right lower limb: Secondary | ICD-10-CM | POA: Diagnosis not present

## 2018-12-23 DIAGNOSIS — M869 Osteomyelitis, unspecified: Secondary | ICD-10-CM | POA: Diagnosis not present

## 2018-12-23 DIAGNOSIS — Z452 Encounter for adjustment and management of vascular access device: Secondary | ICD-10-CM | POA: Diagnosis not present

## 2018-12-23 DIAGNOSIS — R972 Elevated prostate specific antigen [PSA]: Secondary | ICD-10-CM | POA: Diagnosis not present

## 2018-12-23 DIAGNOSIS — Z792 Long term (current) use of antibiotics: Secondary | ICD-10-CM | POA: Diagnosis not present

## 2018-12-23 DIAGNOSIS — N281 Cyst of kidney, acquired: Secondary | ICD-10-CM | POA: Diagnosis not present

## 2018-12-23 DIAGNOSIS — L03115 Cellulitis of right lower limb: Secondary | ICD-10-CM | POA: Diagnosis not present

## 2018-12-23 DIAGNOSIS — E1169 Type 2 diabetes mellitus with other specified complication: Secondary | ICD-10-CM | POA: Diagnosis not present

## 2018-12-23 DIAGNOSIS — E11621 Type 2 diabetes mellitus with foot ulcer: Secondary | ICD-10-CM | POA: Diagnosis not present

## 2018-12-29 DIAGNOSIS — Z452 Encounter for adjustment and management of vascular access device: Secondary | ICD-10-CM | POA: Diagnosis not present

## 2018-12-29 DIAGNOSIS — E11621 Type 2 diabetes mellitus with foot ulcer: Secondary | ICD-10-CM | POA: Diagnosis not present

## 2018-12-29 DIAGNOSIS — E1169 Type 2 diabetes mellitus with other specified complication: Secondary | ICD-10-CM | POA: Diagnosis not present

## 2018-12-29 DIAGNOSIS — L03115 Cellulitis of right lower limb: Secondary | ICD-10-CM | POA: Diagnosis not present

## 2018-12-29 DIAGNOSIS — Z792 Long term (current) use of antibiotics: Secondary | ICD-10-CM | POA: Diagnosis not present

## 2018-12-29 DIAGNOSIS — M869 Osteomyelitis, unspecified: Secondary | ICD-10-CM | POA: Diagnosis not present

## 2018-12-31 DIAGNOSIS — M868X7 Other osteomyelitis, ankle and foot: Secondary | ICD-10-CM | POA: Diagnosis not present

## 2018-12-31 DIAGNOSIS — Z9119 Patient's noncompliance with other medical treatment and regimen: Secondary | ICD-10-CM | POA: Diagnosis not present

## 2018-12-31 DIAGNOSIS — L97518 Non-pressure chronic ulcer of other part of right foot with other specified severity: Secondary | ICD-10-CM | POA: Diagnosis not present

## 2018-12-31 DIAGNOSIS — L97513 Non-pressure chronic ulcer of other part of right foot with necrosis of muscle: Secondary | ICD-10-CM | POA: Diagnosis not present

## 2018-12-31 DIAGNOSIS — E11621 Type 2 diabetes mellitus with foot ulcer: Secondary | ICD-10-CM | POA: Diagnosis not present

## 2019-01-04 DIAGNOSIS — L03115 Cellulitis of right lower limb: Secondary | ICD-10-CM | POA: Diagnosis not present

## 2019-01-04 DIAGNOSIS — M869 Osteomyelitis, unspecified: Secondary | ICD-10-CM | POA: Diagnosis not present

## 2019-01-04 DIAGNOSIS — E1169 Type 2 diabetes mellitus with other specified complication: Secondary | ICD-10-CM | POA: Diagnosis not present

## 2019-01-04 DIAGNOSIS — Z792 Long term (current) use of antibiotics: Secondary | ICD-10-CM | POA: Diagnosis not present

## 2019-01-04 DIAGNOSIS — Z452 Encounter for adjustment and management of vascular access device: Secondary | ICD-10-CM | POA: Diagnosis not present

## 2019-01-04 DIAGNOSIS — E11621 Type 2 diabetes mellitus with foot ulcer: Secondary | ICD-10-CM | POA: Diagnosis not present

## 2019-01-05 DIAGNOSIS — L03115 Cellulitis of right lower limb: Secondary | ICD-10-CM | POA: Diagnosis not present

## 2019-01-06 DIAGNOSIS — M869 Osteomyelitis, unspecified: Secondary | ICD-10-CM | POA: Diagnosis not present

## 2019-01-06 DIAGNOSIS — E1169 Type 2 diabetes mellitus with other specified complication: Secondary | ICD-10-CM | POA: Diagnosis not present

## 2019-01-06 DIAGNOSIS — Z792 Long term (current) use of antibiotics: Secondary | ICD-10-CM | POA: Diagnosis not present

## 2019-01-06 DIAGNOSIS — L03115 Cellulitis of right lower limb: Secondary | ICD-10-CM | POA: Diagnosis not present

## 2019-01-06 DIAGNOSIS — E11621 Type 2 diabetes mellitus with foot ulcer: Secondary | ICD-10-CM | POA: Diagnosis not present

## 2019-01-06 DIAGNOSIS — Z452 Encounter for adjustment and management of vascular access device: Secondary | ICD-10-CM | POA: Diagnosis not present

## 2019-01-10 DIAGNOSIS — E11621 Type 2 diabetes mellitus with foot ulcer: Secondary | ICD-10-CM | POA: Diagnosis not present

## 2019-01-10 DIAGNOSIS — M869 Osteomyelitis, unspecified: Secondary | ICD-10-CM | POA: Diagnosis not present

## 2019-01-10 DIAGNOSIS — E1169 Type 2 diabetes mellitus with other specified complication: Secondary | ICD-10-CM | POA: Diagnosis not present

## 2019-01-10 DIAGNOSIS — Z452 Encounter for adjustment and management of vascular access device: Secondary | ICD-10-CM | POA: Diagnosis not present

## 2019-01-10 DIAGNOSIS — L03115 Cellulitis of right lower limb: Secondary | ICD-10-CM | POA: Diagnosis not present

## 2019-01-10 DIAGNOSIS — Z792 Long term (current) use of antibiotics: Secondary | ICD-10-CM | POA: Diagnosis not present

## 2019-01-13 DIAGNOSIS — Z8546 Personal history of malignant neoplasm of prostate: Secondary | ICD-10-CM | POA: Diagnosis not present

## 2019-01-13 DIAGNOSIS — L03115 Cellulitis of right lower limb: Secondary | ICD-10-CM | POA: Diagnosis not present

## 2019-01-13 DIAGNOSIS — E1165 Type 2 diabetes mellitus with hyperglycemia: Secondary | ICD-10-CM | POA: Diagnosis not present

## 2019-01-13 DIAGNOSIS — E1169 Type 2 diabetes mellitus with other specified complication: Secondary | ICD-10-CM | POA: Diagnosis not present

## 2019-01-13 DIAGNOSIS — L97511 Non-pressure chronic ulcer of other part of right foot limited to breakdown of skin: Secondary | ICD-10-CM | POA: Diagnosis not present

## 2019-01-13 DIAGNOSIS — Z792 Long term (current) use of antibiotics: Secondary | ICD-10-CM | POA: Diagnosis not present

## 2019-01-13 DIAGNOSIS — N189 Chronic kidney disease, unspecified: Secondary | ICD-10-CM | POA: Diagnosis not present

## 2019-01-13 DIAGNOSIS — E1122 Type 2 diabetes mellitus with diabetic chronic kidney disease: Secondary | ICD-10-CM | POA: Diagnosis not present

## 2019-01-13 DIAGNOSIS — Z4800 Encounter for change or removal of nonsurgical wound dressing: Secondary | ICD-10-CM | POA: Diagnosis not present

## 2019-01-13 DIAGNOSIS — E11621 Type 2 diabetes mellitus with foot ulcer: Secondary | ICD-10-CM | POA: Diagnosis not present

## 2019-01-13 DIAGNOSIS — L97512 Non-pressure chronic ulcer of other part of right foot with fat layer exposed: Secondary | ICD-10-CM | POA: Diagnosis not present

## 2019-01-13 DIAGNOSIS — E1140 Type 2 diabetes mellitus with diabetic neuropathy, unspecified: Secondary | ICD-10-CM | POA: Diagnosis not present

## 2019-01-13 DIAGNOSIS — Z89612 Acquired absence of left leg above knee: Secondary | ICD-10-CM | POA: Diagnosis not present

## 2019-01-13 DIAGNOSIS — I129 Hypertensive chronic kidney disease with stage 1 through stage 4 chronic kidney disease, or unspecified chronic kidney disease: Secondary | ICD-10-CM | POA: Diagnosis not present

## 2019-01-13 DIAGNOSIS — Z452 Encounter for adjustment and management of vascular access device: Secondary | ICD-10-CM | POA: Diagnosis not present

## 2019-01-13 DIAGNOSIS — Z742 Need for assistance at home and no other household member able to render care: Secondary | ICD-10-CM | POA: Diagnosis not present

## 2019-01-13 DIAGNOSIS — M869 Osteomyelitis, unspecified: Secondary | ICD-10-CM | POA: Diagnosis not present

## 2019-01-14 DIAGNOSIS — Z6839 Body mass index (BMI) 39.0-39.9, adult: Secondary | ICD-10-CM | POA: Diagnosis not present

## 2019-01-14 DIAGNOSIS — I10 Essential (primary) hypertension: Secondary | ICD-10-CM | POA: Diagnosis not present

## 2019-01-14 DIAGNOSIS — Z299 Encounter for prophylactic measures, unspecified: Secondary | ICD-10-CM | POA: Diagnosis not present

## 2019-01-14 DIAGNOSIS — I4892 Unspecified atrial flutter: Secondary | ICD-10-CM | POA: Diagnosis not present

## 2019-01-14 DIAGNOSIS — E782 Mixed hyperlipidemia: Secondary | ICD-10-CM | POA: Diagnosis not present

## 2019-01-14 DIAGNOSIS — J069 Acute upper respiratory infection, unspecified: Secondary | ICD-10-CM | POA: Diagnosis not present

## 2019-01-14 DIAGNOSIS — Z789 Other specified health status: Secondary | ICD-10-CM | POA: Diagnosis not present

## 2019-01-17 DIAGNOSIS — Z452 Encounter for adjustment and management of vascular access device: Secondary | ICD-10-CM | POA: Diagnosis not present

## 2019-01-17 DIAGNOSIS — M869 Osteomyelitis, unspecified: Secondary | ICD-10-CM | POA: Diagnosis not present

## 2019-01-17 DIAGNOSIS — E11621 Type 2 diabetes mellitus with foot ulcer: Secondary | ICD-10-CM | POA: Diagnosis not present

## 2019-01-17 DIAGNOSIS — E1169 Type 2 diabetes mellitus with other specified complication: Secondary | ICD-10-CM | POA: Diagnosis not present

## 2019-01-17 DIAGNOSIS — L03115 Cellulitis of right lower limb: Secondary | ICD-10-CM | POA: Diagnosis not present

## 2019-01-17 DIAGNOSIS — Z792 Long term (current) use of antibiotics: Secondary | ICD-10-CM | POA: Diagnosis not present

## 2019-01-20 DIAGNOSIS — Z792 Long term (current) use of antibiotics: Secondary | ICD-10-CM | POA: Diagnosis not present

## 2019-01-20 DIAGNOSIS — L03115 Cellulitis of right lower limb: Secondary | ICD-10-CM | POA: Diagnosis not present

## 2019-01-20 DIAGNOSIS — Z452 Encounter for adjustment and management of vascular access device: Secondary | ICD-10-CM | POA: Diagnosis not present

## 2019-01-20 DIAGNOSIS — M869 Osteomyelitis, unspecified: Secondary | ICD-10-CM | POA: Diagnosis not present

## 2019-01-20 DIAGNOSIS — E11621 Type 2 diabetes mellitus with foot ulcer: Secondary | ICD-10-CM | POA: Diagnosis not present

## 2019-01-20 DIAGNOSIS — E1169 Type 2 diabetes mellitus with other specified complication: Secondary | ICD-10-CM | POA: Diagnosis not present

## 2019-01-21 DIAGNOSIS — Z6839 Body mass index (BMI) 39.0-39.9, adult: Secondary | ICD-10-CM | POA: Diagnosis not present

## 2019-01-21 DIAGNOSIS — L97518 Non-pressure chronic ulcer of other part of right foot with other specified severity: Secondary | ICD-10-CM | POA: Diagnosis not present

## 2019-01-21 DIAGNOSIS — T24231A Burn of second degree of right lower leg, initial encounter: Secondary | ICD-10-CM | POA: Diagnosis not present

## 2019-01-21 DIAGNOSIS — E11621 Type 2 diabetes mellitus with foot ulcer: Secondary | ICD-10-CM | POA: Diagnosis not present

## 2019-01-21 DIAGNOSIS — L97509 Non-pressure chronic ulcer of other part of unspecified foot with unspecified severity: Secondary | ICD-10-CM | POA: Diagnosis not present

## 2019-01-21 DIAGNOSIS — M869 Osteomyelitis, unspecified: Secondary | ICD-10-CM | POA: Diagnosis not present

## 2019-01-21 DIAGNOSIS — Z299 Encounter for prophylactic measures, unspecified: Secondary | ICD-10-CM | POA: Diagnosis not present

## 2019-01-21 DIAGNOSIS — L97513 Non-pressure chronic ulcer of other part of right foot with necrosis of muscle: Secondary | ICD-10-CM | POA: Diagnosis not present

## 2019-01-21 DIAGNOSIS — E1169 Type 2 diabetes mellitus with other specified complication: Secondary | ICD-10-CM | POA: Diagnosis not present

## 2019-01-24 DIAGNOSIS — E1169 Type 2 diabetes mellitus with other specified complication: Secondary | ICD-10-CM | POA: Diagnosis not present

## 2019-01-24 DIAGNOSIS — M869 Osteomyelitis, unspecified: Secondary | ICD-10-CM | POA: Diagnosis not present

## 2019-01-24 DIAGNOSIS — E11621 Type 2 diabetes mellitus with foot ulcer: Secondary | ICD-10-CM | POA: Diagnosis not present

## 2019-01-24 DIAGNOSIS — L03115 Cellulitis of right lower limb: Secondary | ICD-10-CM | POA: Diagnosis not present

## 2019-01-24 DIAGNOSIS — Z792 Long term (current) use of antibiotics: Secondary | ICD-10-CM | POA: Diagnosis not present

## 2019-01-24 DIAGNOSIS — Z452 Encounter for adjustment and management of vascular access device: Secondary | ICD-10-CM | POA: Diagnosis not present

## 2019-01-27 DIAGNOSIS — L03115 Cellulitis of right lower limb: Secondary | ICD-10-CM | POA: Diagnosis not present

## 2019-01-27 DIAGNOSIS — M869 Osteomyelitis, unspecified: Secondary | ICD-10-CM | POA: Diagnosis not present

## 2019-01-27 DIAGNOSIS — Z452 Encounter for adjustment and management of vascular access device: Secondary | ICD-10-CM | POA: Diagnosis not present

## 2019-01-27 DIAGNOSIS — E11621 Type 2 diabetes mellitus with foot ulcer: Secondary | ICD-10-CM | POA: Diagnosis not present

## 2019-01-27 DIAGNOSIS — Z792 Long term (current) use of antibiotics: Secondary | ICD-10-CM | POA: Diagnosis not present

## 2019-01-27 DIAGNOSIS — E1169 Type 2 diabetes mellitus with other specified complication: Secondary | ICD-10-CM | POA: Diagnosis not present

## 2019-01-31 DIAGNOSIS — Z452 Encounter for adjustment and management of vascular access device: Secondary | ICD-10-CM | POA: Diagnosis not present

## 2019-01-31 DIAGNOSIS — M869 Osteomyelitis, unspecified: Secondary | ICD-10-CM | POA: Diagnosis not present

## 2019-01-31 DIAGNOSIS — E11621 Type 2 diabetes mellitus with foot ulcer: Secondary | ICD-10-CM | POA: Diagnosis not present

## 2019-01-31 DIAGNOSIS — L03115 Cellulitis of right lower limb: Secondary | ICD-10-CM | POA: Diagnosis not present

## 2019-01-31 DIAGNOSIS — E1169 Type 2 diabetes mellitus with other specified complication: Secondary | ICD-10-CM | POA: Diagnosis not present

## 2019-01-31 DIAGNOSIS — Z792 Long term (current) use of antibiotics: Secondary | ICD-10-CM | POA: Diagnosis not present

## 2019-02-02 DIAGNOSIS — E7800 Pure hypercholesterolemia, unspecified: Secondary | ICD-10-CM | POA: Diagnosis not present

## 2019-02-02 DIAGNOSIS — E119 Type 2 diabetes mellitus without complications: Secondary | ICD-10-CM | POA: Diagnosis not present

## 2019-02-03 DIAGNOSIS — L03115 Cellulitis of right lower limb: Secondary | ICD-10-CM | POA: Diagnosis not present

## 2019-02-03 DIAGNOSIS — M869 Osteomyelitis, unspecified: Secondary | ICD-10-CM | POA: Diagnosis not present

## 2019-02-03 DIAGNOSIS — Z452 Encounter for adjustment and management of vascular access device: Secondary | ICD-10-CM | POA: Diagnosis not present

## 2019-02-03 DIAGNOSIS — E11621 Type 2 diabetes mellitus with foot ulcer: Secondary | ICD-10-CM | POA: Diagnosis not present

## 2019-02-03 DIAGNOSIS — E1169 Type 2 diabetes mellitus with other specified complication: Secondary | ICD-10-CM | POA: Diagnosis not present

## 2019-02-03 DIAGNOSIS — Z792 Long term (current) use of antibiotics: Secondary | ICD-10-CM | POA: Diagnosis not present

## 2019-02-04 DIAGNOSIS — M869 Osteomyelitis, unspecified: Secondary | ICD-10-CM | POA: Diagnosis not present

## 2019-02-04 DIAGNOSIS — L97513 Non-pressure chronic ulcer of other part of right foot with necrosis of muscle: Secondary | ICD-10-CM | POA: Diagnosis not present

## 2019-02-04 DIAGNOSIS — L97518 Non-pressure chronic ulcer of other part of right foot with other specified severity: Secondary | ICD-10-CM | POA: Diagnosis not present

## 2019-02-04 DIAGNOSIS — E11621 Type 2 diabetes mellitus with foot ulcer: Secondary | ICD-10-CM | POA: Diagnosis not present

## 2019-02-04 DIAGNOSIS — E1169 Type 2 diabetes mellitus with other specified complication: Secondary | ICD-10-CM | POA: Diagnosis not present

## 2019-02-07 DIAGNOSIS — M869 Osteomyelitis, unspecified: Secondary | ICD-10-CM | POA: Diagnosis not present

## 2019-02-07 DIAGNOSIS — Z792 Long term (current) use of antibiotics: Secondary | ICD-10-CM | POA: Diagnosis not present

## 2019-02-07 DIAGNOSIS — L03115 Cellulitis of right lower limb: Secondary | ICD-10-CM | POA: Diagnosis not present

## 2019-02-07 DIAGNOSIS — Z452 Encounter for adjustment and management of vascular access device: Secondary | ICD-10-CM | POA: Diagnosis not present

## 2019-02-07 DIAGNOSIS — E1169 Type 2 diabetes mellitus with other specified complication: Secondary | ICD-10-CM | POA: Diagnosis not present

## 2019-02-07 DIAGNOSIS — E11621 Type 2 diabetes mellitus with foot ulcer: Secondary | ICD-10-CM | POA: Diagnosis not present

## 2019-02-10 DIAGNOSIS — Z792 Long term (current) use of antibiotics: Secondary | ICD-10-CM | POA: Diagnosis not present

## 2019-02-10 DIAGNOSIS — L03115 Cellulitis of right lower limb: Secondary | ICD-10-CM | POA: Diagnosis not present

## 2019-02-10 DIAGNOSIS — Z452 Encounter for adjustment and management of vascular access device: Secondary | ICD-10-CM | POA: Diagnosis not present

## 2019-02-10 DIAGNOSIS — M869 Osteomyelitis, unspecified: Secondary | ICD-10-CM | POA: Diagnosis not present

## 2019-02-10 DIAGNOSIS — E1169 Type 2 diabetes mellitus with other specified complication: Secondary | ICD-10-CM | POA: Diagnosis not present

## 2019-02-10 DIAGNOSIS — E11621 Type 2 diabetes mellitus with foot ulcer: Secondary | ICD-10-CM | POA: Diagnosis not present

## 2019-02-15 DIAGNOSIS — L97518 Non-pressure chronic ulcer of other part of right foot with other specified severity: Secondary | ICD-10-CM | POA: Diagnosis not present

## 2019-02-15 DIAGNOSIS — E1169 Type 2 diabetes mellitus with other specified complication: Secondary | ICD-10-CM | POA: Diagnosis not present

## 2019-02-15 DIAGNOSIS — M25474 Effusion, right foot: Secondary | ICD-10-CM | POA: Diagnosis not present

## 2019-02-15 DIAGNOSIS — M869 Osteomyelitis, unspecified: Secondary | ICD-10-CM | POA: Diagnosis not present

## 2019-02-15 DIAGNOSIS — E11621 Type 2 diabetes mellitus with foot ulcer: Secondary | ICD-10-CM | POA: Diagnosis not present

## 2019-02-25 DIAGNOSIS — E11621 Type 2 diabetes mellitus with foot ulcer: Secondary | ICD-10-CM | POA: Diagnosis not present

## 2019-02-25 DIAGNOSIS — L97513 Non-pressure chronic ulcer of other part of right foot with necrosis of muscle: Secondary | ICD-10-CM | POA: Diagnosis not present

## 2019-02-25 DIAGNOSIS — M869 Osteomyelitis, unspecified: Secondary | ICD-10-CM | POA: Diagnosis not present

## 2019-02-25 DIAGNOSIS — L97518 Non-pressure chronic ulcer of other part of right foot with other specified severity: Secondary | ICD-10-CM | POA: Diagnosis not present

## 2019-03-11 DIAGNOSIS — L97518 Non-pressure chronic ulcer of other part of right foot with other specified severity: Secondary | ICD-10-CM | POA: Diagnosis not present

## 2019-03-11 DIAGNOSIS — E11621 Type 2 diabetes mellitus with foot ulcer: Secondary | ICD-10-CM | POA: Diagnosis not present

## 2019-03-11 DIAGNOSIS — M869 Osteomyelitis, unspecified: Secondary | ICD-10-CM | POA: Diagnosis not present

## 2019-03-25 DIAGNOSIS — L97518 Non-pressure chronic ulcer of other part of right foot with other specified severity: Secondary | ICD-10-CM | POA: Diagnosis not present

## 2019-03-25 DIAGNOSIS — L97519 Non-pressure chronic ulcer of other part of right foot with unspecified severity: Secondary | ICD-10-CM | POA: Diagnosis not present

## 2019-03-25 DIAGNOSIS — E11621 Type 2 diabetes mellitus with foot ulcer: Secondary | ICD-10-CM | POA: Diagnosis not present

## 2019-03-30 DIAGNOSIS — S90819A Abrasion, unspecified foot, initial encounter: Secondary | ICD-10-CM | POA: Diagnosis not present

## 2019-03-30 DIAGNOSIS — C61 Malignant neoplasm of prostate: Secondary | ICD-10-CM | POA: Diagnosis not present

## 2019-03-30 DIAGNOSIS — E1165 Type 2 diabetes mellitus with hyperglycemia: Secondary | ICD-10-CM | POA: Diagnosis not present

## 2019-03-30 DIAGNOSIS — L089 Local infection of the skin and subcutaneous tissue, unspecified: Secondary | ICD-10-CM | POA: Diagnosis not present

## 2019-03-30 DIAGNOSIS — Z299 Encounter for prophylactic measures, unspecified: Secondary | ICD-10-CM | POA: Diagnosis not present

## 2019-03-30 DIAGNOSIS — I10 Essential (primary) hypertension: Secondary | ICD-10-CM | POA: Diagnosis not present

## 2019-03-30 DIAGNOSIS — Z6838 Body mass index (BMI) 38.0-38.9, adult: Secondary | ICD-10-CM | POA: Diagnosis not present

## 2019-04-15 DIAGNOSIS — E11621 Type 2 diabetes mellitus with foot ulcer: Secondary | ICD-10-CM | POA: Diagnosis not present

## 2019-04-15 DIAGNOSIS — L97513 Non-pressure chronic ulcer of other part of right foot with necrosis of muscle: Secondary | ICD-10-CM | POA: Diagnosis not present

## 2019-04-15 DIAGNOSIS — L97518 Non-pressure chronic ulcer of other part of right foot with other specified severity: Secondary | ICD-10-CM | POA: Diagnosis not present

## 2019-05-06 DIAGNOSIS — E1169 Type 2 diabetes mellitus with other specified complication: Secondary | ICD-10-CM | POA: Diagnosis not present

## 2019-05-06 DIAGNOSIS — E11621 Type 2 diabetes mellitus with foot ulcer: Secondary | ICD-10-CM | POA: Diagnosis not present

## 2019-05-06 DIAGNOSIS — M869 Osteomyelitis, unspecified: Secondary | ICD-10-CM | POA: Diagnosis not present

## 2019-05-06 DIAGNOSIS — L97518 Non-pressure chronic ulcer of other part of right foot with other specified severity: Secondary | ICD-10-CM | POA: Diagnosis not present

## 2019-05-06 DIAGNOSIS — L97519 Non-pressure chronic ulcer of other part of right foot with unspecified severity: Secondary | ICD-10-CM | POA: Diagnosis not present

## 2019-05-09 DIAGNOSIS — L97511 Non-pressure chronic ulcer of other part of right foot limited to breakdown of skin: Secondary | ICD-10-CM | POA: Diagnosis not present

## 2019-05-09 DIAGNOSIS — E1122 Type 2 diabetes mellitus with diabetic chronic kidney disease: Secondary | ICD-10-CM | POA: Diagnosis not present

## 2019-05-09 DIAGNOSIS — E11621 Type 2 diabetes mellitus with foot ulcer: Secondary | ICD-10-CM | POA: Diagnosis not present

## 2019-05-09 DIAGNOSIS — I129 Hypertensive chronic kidney disease with stage 1 through stage 4 chronic kidney disease, or unspecified chronic kidney disease: Secondary | ICD-10-CM | POA: Diagnosis not present

## 2019-05-09 DIAGNOSIS — Z7984 Long term (current) use of oral hypoglycemic drugs: Secondary | ICD-10-CM | POA: Diagnosis not present

## 2019-05-09 DIAGNOSIS — Z89512 Acquired absence of left leg below knee: Secondary | ICD-10-CM | POA: Diagnosis not present

## 2019-05-09 DIAGNOSIS — N189 Chronic kidney disease, unspecified: Secondary | ICD-10-CM | POA: Diagnosis not present

## 2019-05-12 DIAGNOSIS — E1122 Type 2 diabetes mellitus with diabetic chronic kidney disease: Secondary | ICD-10-CM | POA: Diagnosis not present

## 2019-05-12 DIAGNOSIS — I129 Hypertensive chronic kidney disease with stage 1 through stage 4 chronic kidney disease, or unspecified chronic kidney disease: Secondary | ICD-10-CM | POA: Diagnosis not present

## 2019-05-12 DIAGNOSIS — E11621 Type 2 diabetes mellitus with foot ulcer: Secondary | ICD-10-CM | POA: Diagnosis not present

## 2019-05-12 DIAGNOSIS — L97511 Non-pressure chronic ulcer of other part of right foot limited to breakdown of skin: Secondary | ICD-10-CM | POA: Diagnosis not present

## 2019-05-12 DIAGNOSIS — N189 Chronic kidney disease, unspecified: Secondary | ICD-10-CM | POA: Diagnosis not present

## 2019-05-12 DIAGNOSIS — Z7984 Long term (current) use of oral hypoglycemic drugs: Secondary | ICD-10-CM | POA: Diagnosis not present

## 2019-05-16 DIAGNOSIS — E11621 Type 2 diabetes mellitus with foot ulcer: Secondary | ICD-10-CM | POA: Diagnosis not present

## 2019-05-16 DIAGNOSIS — E1122 Type 2 diabetes mellitus with diabetic chronic kidney disease: Secondary | ICD-10-CM | POA: Diagnosis not present

## 2019-05-16 DIAGNOSIS — N189 Chronic kidney disease, unspecified: Secondary | ICD-10-CM | POA: Diagnosis not present

## 2019-05-16 DIAGNOSIS — I129 Hypertensive chronic kidney disease with stage 1 through stage 4 chronic kidney disease, or unspecified chronic kidney disease: Secondary | ICD-10-CM | POA: Diagnosis not present

## 2019-05-16 DIAGNOSIS — L97511 Non-pressure chronic ulcer of other part of right foot limited to breakdown of skin: Secondary | ICD-10-CM | POA: Diagnosis not present

## 2019-05-16 DIAGNOSIS — Z7984 Long term (current) use of oral hypoglycemic drugs: Secondary | ICD-10-CM | POA: Diagnosis not present

## 2019-05-19 DIAGNOSIS — L97511 Non-pressure chronic ulcer of other part of right foot limited to breakdown of skin: Secondary | ICD-10-CM | POA: Diagnosis not present

## 2019-05-19 DIAGNOSIS — E1122 Type 2 diabetes mellitus with diabetic chronic kidney disease: Secondary | ICD-10-CM | POA: Diagnosis not present

## 2019-05-19 DIAGNOSIS — Z7984 Long term (current) use of oral hypoglycemic drugs: Secondary | ICD-10-CM | POA: Diagnosis not present

## 2019-05-19 DIAGNOSIS — N189 Chronic kidney disease, unspecified: Secondary | ICD-10-CM | POA: Diagnosis not present

## 2019-05-19 DIAGNOSIS — I129 Hypertensive chronic kidney disease with stage 1 through stage 4 chronic kidney disease, or unspecified chronic kidney disease: Secondary | ICD-10-CM | POA: Diagnosis not present

## 2019-05-19 DIAGNOSIS — E11621 Type 2 diabetes mellitus with foot ulcer: Secondary | ICD-10-CM | POA: Diagnosis not present

## 2019-05-23 DIAGNOSIS — E11621 Type 2 diabetes mellitus with foot ulcer: Secondary | ICD-10-CM | POA: Diagnosis not present

## 2019-05-23 DIAGNOSIS — L97511 Non-pressure chronic ulcer of other part of right foot limited to breakdown of skin: Secondary | ICD-10-CM | POA: Diagnosis not present

## 2019-05-23 DIAGNOSIS — E1122 Type 2 diabetes mellitus with diabetic chronic kidney disease: Secondary | ICD-10-CM | POA: Diagnosis not present

## 2019-05-23 DIAGNOSIS — Z7984 Long term (current) use of oral hypoglycemic drugs: Secondary | ICD-10-CM | POA: Diagnosis not present

## 2019-05-23 DIAGNOSIS — I129 Hypertensive chronic kidney disease with stage 1 through stage 4 chronic kidney disease, or unspecified chronic kidney disease: Secondary | ICD-10-CM | POA: Diagnosis not present

## 2019-05-23 DIAGNOSIS — N189 Chronic kidney disease, unspecified: Secondary | ICD-10-CM | POA: Diagnosis not present

## 2019-05-24 DIAGNOSIS — R609 Edema, unspecified: Secondary | ICD-10-CM | POA: Diagnosis not present

## 2019-05-24 DIAGNOSIS — Z6839 Body mass index (BMI) 39.0-39.9, adult: Secondary | ICD-10-CM | POA: Diagnosis not present

## 2019-05-24 DIAGNOSIS — N183 Chronic kidney disease, stage 3 (moderate): Secondary | ICD-10-CM | POA: Diagnosis not present

## 2019-05-24 DIAGNOSIS — I10 Essential (primary) hypertension: Secondary | ICD-10-CM | POA: Diagnosis not present

## 2019-05-24 DIAGNOSIS — E1122 Type 2 diabetes mellitus with diabetic chronic kidney disease: Secondary | ICD-10-CM | POA: Diagnosis not present

## 2019-05-24 DIAGNOSIS — Z299 Encounter for prophylactic measures, unspecified: Secondary | ICD-10-CM | POA: Diagnosis not present

## 2019-05-27 DIAGNOSIS — L97519 Non-pressure chronic ulcer of other part of right foot with unspecified severity: Secondary | ICD-10-CM | POA: Diagnosis not present

## 2019-05-27 DIAGNOSIS — E11621 Type 2 diabetes mellitus with foot ulcer: Secondary | ICD-10-CM | POA: Diagnosis not present

## 2019-05-27 DIAGNOSIS — L97518 Non-pressure chronic ulcer of other part of right foot with other specified severity: Secondary | ICD-10-CM | POA: Diagnosis not present

## 2019-05-30 DIAGNOSIS — E11621 Type 2 diabetes mellitus with foot ulcer: Secondary | ICD-10-CM | POA: Diagnosis not present

## 2019-05-30 DIAGNOSIS — Z7984 Long term (current) use of oral hypoglycemic drugs: Secondary | ICD-10-CM | POA: Diagnosis not present

## 2019-05-30 DIAGNOSIS — N189 Chronic kidney disease, unspecified: Secondary | ICD-10-CM | POA: Diagnosis not present

## 2019-05-30 DIAGNOSIS — E1122 Type 2 diabetes mellitus with diabetic chronic kidney disease: Secondary | ICD-10-CM | POA: Diagnosis not present

## 2019-05-30 DIAGNOSIS — L97511 Non-pressure chronic ulcer of other part of right foot limited to breakdown of skin: Secondary | ICD-10-CM | POA: Diagnosis not present

## 2019-05-30 DIAGNOSIS — I129 Hypertensive chronic kidney disease with stage 1 through stage 4 chronic kidney disease, or unspecified chronic kidney disease: Secondary | ICD-10-CM | POA: Diagnosis not present

## 2019-06-02 DIAGNOSIS — L97511 Non-pressure chronic ulcer of other part of right foot limited to breakdown of skin: Secondary | ICD-10-CM | POA: Diagnosis not present

## 2019-06-02 DIAGNOSIS — E11621 Type 2 diabetes mellitus with foot ulcer: Secondary | ICD-10-CM | POA: Diagnosis not present

## 2019-06-02 DIAGNOSIS — Z7984 Long term (current) use of oral hypoglycemic drugs: Secondary | ICD-10-CM | POA: Diagnosis not present

## 2019-06-02 DIAGNOSIS — E1122 Type 2 diabetes mellitus with diabetic chronic kidney disease: Secondary | ICD-10-CM | POA: Diagnosis not present

## 2019-06-02 DIAGNOSIS — N189 Chronic kidney disease, unspecified: Secondary | ICD-10-CM | POA: Diagnosis not present

## 2019-06-02 DIAGNOSIS — I129 Hypertensive chronic kidney disease with stage 1 through stage 4 chronic kidney disease, or unspecified chronic kidney disease: Secondary | ICD-10-CM | POA: Diagnosis not present

## 2019-06-06 DIAGNOSIS — N189 Chronic kidney disease, unspecified: Secondary | ICD-10-CM | POA: Diagnosis not present

## 2019-06-06 DIAGNOSIS — I129 Hypertensive chronic kidney disease with stage 1 through stage 4 chronic kidney disease, or unspecified chronic kidney disease: Secondary | ICD-10-CM | POA: Diagnosis not present

## 2019-06-06 DIAGNOSIS — E11621 Type 2 diabetes mellitus with foot ulcer: Secondary | ICD-10-CM | POA: Diagnosis not present

## 2019-06-06 DIAGNOSIS — E1122 Type 2 diabetes mellitus with diabetic chronic kidney disease: Secondary | ICD-10-CM | POA: Diagnosis not present

## 2019-06-06 DIAGNOSIS — L97511 Non-pressure chronic ulcer of other part of right foot limited to breakdown of skin: Secondary | ICD-10-CM | POA: Diagnosis not present

## 2019-06-06 DIAGNOSIS — Z7984 Long term (current) use of oral hypoglycemic drugs: Secondary | ICD-10-CM | POA: Diagnosis not present

## 2019-06-08 DIAGNOSIS — E11621 Type 2 diabetes mellitus with foot ulcer: Secondary | ICD-10-CM | POA: Diagnosis not present

## 2019-06-08 DIAGNOSIS — I129 Hypertensive chronic kidney disease with stage 1 through stage 4 chronic kidney disease, or unspecified chronic kidney disease: Secondary | ICD-10-CM | POA: Diagnosis not present

## 2019-06-08 DIAGNOSIS — E1122 Type 2 diabetes mellitus with diabetic chronic kidney disease: Secondary | ICD-10-CM | POA: Diagnosis not present

## 2019-06-08 DIAGNOSIS — N189 Chronic kidney disease, unspecified: Secondary | ICD-10-CM | POA: Diagnosis not present

## 2019-06-08 DIAGNOSIS — L97511 Non-pressure chronic ulcer of other part of right foot limited to breakdown of skin: Secondary | ICD-10-CM | POA: Diagnosis not present

## 2019-06-08 DIAGNOSIS — Z89512 Acquired absence of left leg below knee: Secondary | ICD-10-CM | POA: Diagnosis not present

## 2019-06-08 DIAGNOSIS — Z7984 Long term (current) use of oral hypoglycemic drugs: Secondary | ICD-10-CM | POA: Diagnosis not present

## 2019-06-09 DIAGNOSIS — E1122 Type 2 diabetes mellitus with diabetic chronic kidney disease: Secondary | ICD-10-CM | POA: Diagnosis not present

## 2019-06-09 DIAGNOSIS — E11621 Type 2 diabetes mellitus with foot ulcer: Secondary | ICD-10-CM | POA: Diagnosis not present

## 2019-06-09 DIAGNOSIS — L97511 Non-pressure chronic ulcer of other part of right foot limited to breakdown of skin: Secondary | ICD-10-CM | POA: Diagnosis not present

## 2019-06-09 DIAGNOSIS — I129 Hypertensive chronic kidney disease with stage 1 through stage 4 chronic kidney disease, or unspecified chronic kidney disease: Secondary | ICD-10-CM | POA: Diagnosis not present

## 2019-06-09 DIAGNOSIS — Z7984 Long term (current) use of oral hypoglycemic drugs: Secondary | ICD-10-CM | POA: Diagnosis not present

## 2019-06-09 DIAGNOSIS — N189 Chronic kidney disease, unspecified: Secondary | ICD-10-CM | POA: Diagnosis not present

## 2019-06-10 DIAGNOSIS — L97518 Non-pressure chronic ulcer of other part of right foot with other specified severity: Secondary | ICD-10-CM | POA: Diagnosis not present

## 2019-06-10 DIAGNOSIS — E11621 Type 2 diabetes mellitus with foot ulcer: Secondary | ICD-10-CM | POA: Diagnosis not present

## 2019-06-13 DIAGNOSIS — I129 Hypertensive chronic kidney disease with stage 1 through stage 4 chronic kidney disease, or unspecified chronic kidney disease: Secondary | ICD-10-CM | POA: Diagnosis not present

## 2019-06-13 DIAGNOSIS — N189 Chronic kidney disease, unspecified: Secondary | ICD-10-CM | POA: Diagnosis not present

## 2019-06-13 DIAGNOSIS — L97511 Non-pressure chronic ulcer of other part of right foot limited to breakdown of skin: Secondary | ICD-10-CM | POA: Diagnosis not present

## 2019-06-13 DIAGNOSIS — E11621 Type 2 diabetes mellitus with foot ulcer: Secondary | ICD-10-CM | POA: Diagnosis not present

## 2019-06-13 DIAGNOSIS — Z7984 Long term (current) use of oral hypoglycemic drugs: Secondary | ICD-10-CM | POA: Diagnosis not present

## 2019-06-13 DIAGNOSIS — E1122 Type 2 diabetes mellitus with diabetic chronic kidney disease: Secondary | ICD-10-CM | POA: Diagnosis not present

## 2019-06-16 DIAGNOSIS — L97511 Non-pressure chronic ulcer of other part of right foot limited to breakdown of skin: Secondary | ICD-10-CM | POA: Diagnosis not present

## 2019-06-16 DIAGNOSIS — E11621 Type 2 diabetes mellitus with foot ulcer: Secondary | ICD-10-CM | POA: Diagnosis not present

## 2019-06-16 DIAGNOSIS — N189 Chronic kidney disease, unspecified: Secondary | ICD-10-CM | POA: Diagnosis not present

## 2019-06-16 DIAGNOSIS — I129 Hypertensive chronic kidney disease with stage 1 through stage 4 chronic kidney disease, or unspecified chronic kidney disease: Secondary | ICD-10-CM | POA: Diagnosis not present

## 2019-06-16 DIAGNOSIS — Z7984 Long term (current) use of oral hypoglycemic drugs: Secondary | ICD-10-CM | POA: Diagnosis not present

## 2019-06-16 DIAGNOSIS — E1122 Type 2 diabetes mellitus with diabetic chronic kidney disease: Secondary | ICD-10-CM | POA: Diagnosis not present

## 2019-06-20 DIAGNOSIS — N189 Chronic kidney disease, unspecified: Secondary | ICD-10-CM | POA: Diagnosis not present

## 2019-06-20 DIAGNOSIS — Z7984 Long term (current) use of oral hypoglycemic drugs: Secondary | ICD-10-CM | POA: Diagnosis not present

## 2019-06-20 DIAGNOSIS — I129 Hypertensive chronic kidney disease with stage 1 through stage 4 chronic kidney disease, or unspecified chronic kidney disease: Secondary | ICD-10-CM | POA: Diagnosis not present

## 2019-06-20 DIAGNOSIS — E1122 Type 2 diabetes mellitus with diabetic chronic kidney disease: Secondary | ICD-10-CM | POA: Diagnosis not present

## 2019-06-20 DIAGNOSIS — E11621 Type 2 diabetes mellitus with foot ulcer: Secondary | ICD-10-CM | POA: Diagnosis not present

## 2019-06-20 DIAGNOSIS — L97511 Non-pressure chronic ulcer of other part of right foot limited to breakdown of skin: Secondary | ICD-10-CM | POA: Diagnosis not present

## 2019-06-23 DIAGNOSIS — N189 Chronic kidney disease, unspecified: Secondary | ICD-10-CM | POA: Diagnosis not present

## 2019-06-23 DIAGNOSIS — L97511 Non-pressure chronic ulcer of other part of right foot limited to breakdown of skin: Secondary | ICD-10-CM | POA: Diagnosis not present

## 2019-06-23 DIAGNOSIS — E1122 Type 2 diabetes mellitus with diabetic chronic kidney disease: Secondary | ICD-10-CM | POA: Diagnosis not present

## 2019-06-23 DIAGNOSIS — E11621 Type 2 diabetes mellitus with foot ulcer: Secondary | ICD-10-CM | POA: Diagnosis not present

## 2019-06-23 DIAGNOSIS — I129 Hypertensive chronic kidney disease with stage 1 through stage 4 chronic kidney disease, or unspecified chronic kidney disease: Secondary | ICD-10-CM | POA: Diagnosis not present

## 2019-06-23 DIAGNOSIS — Z7984 Long term (current) use of oral hypoglycemic drugs: Secondary | ICD-10-CM | POA: Diagnosis not present

## 2019-06-27 DIAGNOSIS — E11621 Type 2 diabetes mellitus with foot ulcer: Secondary | ICD-10-CM | POA: Diagnosis not present

## 2019-06-27 DIAGNOSIS — L97511 Non-pressure chronic ulcer of other part of right foot limited to breakdown of skin: Secondary | ICD-10-CM | POA: Diagnosis not present

## 2019-06-27 DIAGNOSIS — E1122 Type 2 diabetes mellitus with diabetic chronic kidney disease: Secondary | ICD-10-CM | POA: Diagnosis not present

## 2019-06-27 DIAGNOSIS — Z7984 Long term (current) use of oral hypoglycemic drugs: Secondary | ICD-10-CM | POA: Diagnosis not present

## 2019-06-27 DIAGNOSIS — N189 Chronic kidney disease, unspecified: Secondary | ICD-10-CM | POA: Diagnosis not present

## 2019-06-27 DIAGNOSIS — I129 Hypertensive chronic kidney disease with stage 1 through stage 4 chronic kidney disease, or unspecified chronic kidney disease: Secondary | ICD-10-CM | POA: Diagnosis not present

## 2019-06-30 DIAGNOSIS — E1122 Type 2 diabetes mellitus with diabetic chronic kidney disease: Secondary | ICD-10-CM | POA: Diagnosis not present

## 2019-06-30 DIAGNOSIS — E11621 Type 2 diabetes mellitus with foot ulcer: Secondary | ICD-10-CM | POA: Diagnosis not present

## 2019-06-30 DIAGNOSIS — I129 Hypertensive chronic kidney disease with stage 1 through stage 4 chronic kidney disease, or unspecified chronic kidney disease: Secondary | ICD-10-CM | POA: Diagnosis not present

## 2019-06-30 DIAGNOSIS — L97511 Non-pressure chronic ulcer of other part of right foot limited to breakdown of skin: Secondary | ICD-10-CM | POA: Diagnosis not present

## 2019-06-30 DIAGNOSIS — Z7984 Long term (current) use of oral hypoglycemic drugs: Secondary | ICD-10-CM | POA: Diagnosis not present

## 2019-06-30 DIAGNOSIS — N189 Chronic kidney disease, unspecified: Secondary | ICD-10-CM | POA: Diagnosis not present

## 2019-07-04 DIAGNOSIS — E11621 Type 2 diabetes mellitus with foot ulcer: Secondary | ICD-10-CM | POA: Diagnosis not present

## 2019-07-04 DIAGNOSIS — I129 Hypertensive chronic kidney disease with stage 1 through stage 4 chronic kidney disease, or unspecified chronic kidney disease: Secondary | ICD-10-CM | POA: Diagnosis not present

## 2019-07-04 DIAGNOSIS — N189 Chronic kidney disease, unspecified: Secondary | ICD-10-CM | POA: Diagnosis not present

## 2019-07-04 DIAGNOSIS — L97511 Non-pressure chronic ulcer of other part of right foot limited to breakdown of skin: Secondary | ICD-10-CM | POA: Diagnosis not present

## 2019-07-04 DIAGNOSIS — Z7984 Long term (current) use of oral hypoglycemic drugs: Secondary | ICD-10-CM | POA: Diagnosis not present

## 2019-07-04 DIAGNOSIS — E1122 Type 2 diabetes mellitus with diabetic chronic kidney disease: Secondary | ICD-10-CM | POA: Diagnosis not present

## 2019-07-07 DIAGNOSIS — N189 Chronic kidney disease, unspecified: Secondary | ICD-10-CM | POA: Diagnosis not present

## 2019-07-07 DIAGNOSIS — I129 Hypertensive chronic kidney disease with stage 1 through stage 4 chronic kidney disease, or unspecified chronic kidney disease: Secondary | ICD-10-CM | POA: Diagnosis not present

## 2019-07-07 DIAGNOSIS — E1122 Type 2 diabetes mellitus with diabetic chronic kidney disease: Secondary | ICD-10-CM | POA: Diagnosis not present

## 2019-07-07 DIAGNOSIS — E11621 Type 2 diabetes mellitus with foot ulcer: Secondary | ICD-10-CM | POA: Diagnosis not present

## 2019-07-07 DIAGNOSIS — Z7984 Long term (current) use of oral hypoglycemic drugs: Secondary | ICD-10-CM | POA: Diagnosis not present

## 2019-07-07 DIAGNOSIS — L97511 Non-pressure chronic ulcer of other part of right foot limited to breakdown of skin: Secondary | ICD-10-CM | POA: Diagnosis not present

## 2019-07-08 DIAGNOSIS — N189 Chronic kidney disease, unspecified: Secondary | ICD-10-CM | POA: Diagnosis not present

## 2019-07-08 DIAGNOSIS — L97519 Non-pressure chronic ulcer of other part of right foot with unspecified severity: Secondary | ICD-10-CM | POA: Diagnosis not present

## 2019-07-08 DIAGNOSIS — Z7984 Long term (current) use of oral hypoglycemic drugs: Secondary | ICD-10-CM | POA: Diagnosis not present

## 2019-07-08 DIAGNOSIS — E1122 Type 2 diabetes mellitus with diabetic chronic kidney disease: Secondary | ICD-10-CM | POA: Diagnosis not present

## 2019-07-08 DIAGNOSIS — L97518 Non-pressure chronic ulcer of other part of right foot with other specified severity: Secondary | ICD-10-CM | POA: Diagnosis not present

## 2019-07-08 DIAGNOSIS — I129 Hypertensive chronic kidney disease with stage 1 through stage 4 chronic kidney disease, or unspecified chronic kidney disease: Secondary | ICD-10-CM | POA: Diagnosis not present

## 2019-07-08 DIAGNOSIS — Z89512 Acquired absence of left leg below knee: Secondary | ICD-10-CM | POA: Diagnosis not present

## 2019-07-08 DIAGNOSIS — E11621 Type 2 diabetes mellitus with foot ulcer: Secondary | ICD-10-CM | POA: Diagnosis not present

## 2019-07-08 DIAGNOSIS — L97511 Non-pressure chronic ulcer of other part of right foot limited to breakdown of skin: Secondary | ICD-10-CM | POA: Diagnosis not present

## 2019-07-09 DIAGNOSIS — E1122 Type 2 diabetes mellitus with diabetic chronic kidney disease: Secondary | ICD-10-CM | POA: Diagnosis not present

## 2019-07-09 DIAGNOSIS — I129 Hypertensive chronic kidney disease with stage 1 through stage 4 chronic kidney disease, or unspecified chronic kidney disease: Secondary | ICD-10-CM | POA: Diagnosis not present

## 2019-07-09 DIAGNOSIS — L97511 Non-pressure chronic ulcer of other part of right foot limited to breakdown of skin: Secondary | ICD-10-CM | POA: Diagnosis not present

## 2019-07-09 DIAGNOSIS — E11621 Type 2 diabetes mellitus with foot ulcer: Secondary | ICD-10-CM | POA: Diagnosis not present

## 2019-07-11 DIAGNOSIS — E1122 Type 2 diabetes mellitus with diabetic chronic kidney disease: Secondary | ICD-10-CM | POA: Diagnosis not present

## 2019-07-11 DIAGNOSIS — I129 Hypertensive chronic kidney disease with stage 1 through stage 4 chronic kidney disease, or unspecified chronic kidney disease: Secondary | ICD-10-CM | POA: Diagnosis not present

## 2019-07-11 DIAGNOSIS — E11621 Type 2 diabetes mellitus with foot ulcer: Secondary | ICD-10-CM | POA: Diagnosis not present

## 2019-07-11 DIAGNOSIS — L97511 Non-pressure chronic ulcer of other part of right foot limited to breakdown of skin: Secondary | ICD-10-CM | POA: Diagnosis not present

## 2019-07-11 DIAGNOSIS — N189 Chronic kidney disease, unspecified: Secondary | ICD-10-CM | POA: Diagnosis not present

## 2019-07-11 DIAGNOSIS — Z7984 Long term (current) use of oral hypoglycemic drugs: Secondary | ICD-10-CM | POA: Diagnosis not present

## 2019-07-12 DIAGNOSIS — Z0000 Encounter for general adult medical examination without abnormal findings: Secondary | ICD-10-CM | POA: Diagnosis not present

## 2019-07-12 DIAGNOSIS — Z1331 Encounter for screening for depression: Secondary | ICD-10-CM | POA: Diagnosis not present

## 2019-07-12 DIAGNOSIS — I10 Essential (primary) hypertension: Secondary | ICD-10-CM | POA: Diagnosis not present

## 2019-07-12 DIAGNOSIS — Z7189 Other specified counseling: Secondary | ICD-10-CM | POA: Diagnosis not present

## 2019-07-12 DIAGNOSIS — E1122 Type 2 diabetes mellitus with diabetic chronic kidney disease: Secondary | ICD-10-CM | POA: Diagnosis not present

## 2019-07-12 DIAGNOSIS — Z299 Encounter for prophylactic measures, unspecified: Secondary | ICD-10-CM | POA: Diagnosis not present

## 2019-07-12 DIAGNOSIS — E1165 Type 2 diabetes mellitus with hyperglycemia: Secondary | ICD-10-CM | POA: Diagnosis not present

## 2019-07-12 DIAGNOSIS — Z1339 Encounter for screening examination for other mental health and behavioral disorders: Secondary | ICD-10-CM | POA: Diagnosis not present

## 2019-07-12 DIAGNOSIS — R5383 Other fatigue: Secondary | ICD-10-CM | POA: Diagnosis not present

## 2019-07-12 DIAGNOSIS — Z1211 Encounter for screening for malignant neoplasm of colon: Secondary | ICD-10-CM | POA: Diagnosis not present

## 2019-07-12 DIAGNOSIS — Z6835 Body mass index (BMI) 35.0-35.9, adult: Secondary | ICD-10-CM | POA: Diagnosis not present

## 2019-07-12 DIAGNOSIS — E7800 Pure hypercholesterolemia, unspecified: Secondary | ICD-10-CM | POA: Diagnosis not present

## 2019-07-14 DIAGNOSIS — E1122 Type 2 diabetes mellitus with diabetic chronic kidney disease: Secondary | ICD-10-CM | POA: Diagnosis not present

## 2019-07-14 DIAGNOSIS — I129 Hypertensive chronic kidney disease with stage 1 through stage 4 chronic kidney disease, or unspecified chronic kidney disease: Secondary | ICD-10-CM | POA: Diagnosis not present

## 2019-07-14 DIAGNOSIS — N189 Chronic kidney disease, unspecified: Secondary | ICD-10-CM | POA: Diagnosis not present

## 2019-07-14 DIAGNOSIS — Z7984 Long term (current) use of oral hypoglycemic drugs: Secondary | ICD-10-CM | POA: Diagnosis not present

## 2019-07-14 DIAGNOSIS — E11621 Type 2 diabetes mellitus with foot ulcer: Secondary | ICD-10-CM | POA: Diagnosis not present

## 2019-07-14 DIAGNOSIS — L97511 Non-pressure chronic ulcer of other part of right foot limited to breakdown of skin: Secondary | ICD-10-CM | POA: Diagnosis not present

## 2019-07-18 DIAGNOSIS — E1122 Type 2 diabetes mellitus with diabetic chronic kidney disease: Secondary | ICD-10-CM | POA: Diagnosis not present

## 2019-07-18 DIAGNOSIS — N189 Chronic kidney disease, unspecified: Secondary | ICD-10-CM | POA: Diagnosis not present

## 2019-07-18 DIAGNOSIS — I129 Hypertensive chronic kidney disease with stage 1 through stage 4 chronic kidney disease, or unspecified chronic kidney disease: Secondary | ICD-10-CM | POA: Diagnosis not present

## 2019-07-18 DIAGNOSIS — L97511 Non-pressure chronic ulcer of other part of right foot limited to breakdown of skin: Secondary | ICD-10-CM | POA: Diagnosis not present

## 2019-07-18 DIAGNOSIS — Z7984 Long term (current) use of oral hypoglycemic drugs: Secondary | ICD-10-CM | POA: Diagnosis not present

## 2019-07-18 DIAGNOSIS — E11621 Type 2 diabetes mellitus with foot ulcer: Secondary | ICD-10-CM | POA: Diagnosis not present

## 2019-07-19 DIAGNOSIS — Z23 Encounter for immunization: Secondary | ICD-10-CM | POA: Diagnosis not present

## 2019-07-21 DIAGNOSIS — L97511 Non-pressure chronic ulcer of other part of right foot limited to breakdown of skin: Secondary | ICD-10-CM | POA: Diagnosis not present

## 2019-07-21 DIAGNOSIS — N189 Chronic kidney disease, unspecified: Secondary | ICD-10-CM | POA: Diagnosis not present

## 2019-07-21 DIAGNOSIS — Z23 Encounter for immunization: Secondary | ICD-10-CM | POA: Diagnosis not present

## 2019-07-21 DIAGNOSIS — E1122 Type 2 diabetes mellitus with diabetic chronic kidney disease: Secondary | ICD-10-CM | POA: Diagnosis not present

## 2019-07-21 DIAGNOSIS — Z7984 Long term (current) use of oral hypoglycemic drugs: Secondary | ICD-10-CM | POA: Diagnosis not present

## 2019-07-21 DIAGNOSIS — E11621 Type 2 diabetes mellitus with foot ulcer: Secondary | ICD-10-CM | POA: Diagnosis not present

## 2019-07-21 DIAGNOSIS — I129 Hypertensive chronic kidney disease with stage 1 through stage 4 chronic kidney disease, or unspecified chronic kidney disease: Secondary | ICD-10-CM | POA: Diagnosis not present

## 2019-07-25 DIAGNOSIS — L97511 Non-pressure chronic ulcer of other part of right foot limited to breakdown of skin: Secondary | ICD-10-CM | POA: Diagnosis not present

## 2019-07-25 DIAGNOSIS — Z7984 Long term (current) use of oral hypoglycemic drugs: Secondary | ICD-10-CM | POA: Diagnosis not present

## 2019-07-25 DIAGNOSIS — E11621 Type 2 diabetes mellitus with foot ulcer: Secondary | ICD-10-CM | POA: Diagnosis not present

## 2019-07-25 DIAGNOSIS — I129 Hypertensive chronic kidney disease with stage 1 through stage 4 chronic kidney disease, or unspecified chronic kidney disease: Secondary | ICD-10-CM | POA: Diagnosis not present

## 2019-07-25 DIAGNOSIS — E1122 Type 2 diabetes mellitus with diabetic chronic kidney disease: Secondary | ICD-10-CM | POA: Diagnosis not present

## 2019-07-25 DIAGNOSIS — N189 Chronic kidney disease, unspecified: Secondary | ICD-10-CM | POA: Diagnosis not present

## 2019-07-28 DIAGNOSIS — Z7984 Long term (current) use of oral hypoglycemic drugs: Secondary | ICD-10-CM | POA: Diagnosis not present

## 2019-07-28 DIAGNOSIS — L97511 Non-pressure chronic ulcer of other part of right foot limited to breakdown of skin: Secondary | ICD-10-CM | POA: Diagnosis not present

## 2019-07-28 DIAGNOSIS — I129 Hypertensive chronic kidney disease with stage 1 through stage 4 chronic kidney disease, or unspecified chronic kidney disease: Secondary | ICD-10-CM | POA: Diagnosis not present

## 2019-07-28 DIAGNOSIS — N189 Chronic kidney disease, unspecified: Secondary | ICD-10-CM | POA: Diagnosis not present

## 2019-07-28 DIAGNOSIS — E1122 Type 2 diabetes mellitus with diabetic chronic kidney disease: Secondary | ICD-10-CM | POA: Diagnosis not present

## 2019-07-28 DIAGNOSIS — E11621 Type 2 diabetes mellitus with foot ulcer: Secondary | ICD-10-CM | POA: Diagnosis not present

## 2019-07-29 DIAGNOSIS — I890 Lymphedema, not elsewhere classified: Secondary | ICD-10-CM | POA: Diagnosis not present

## 2019-07-29 DIAGNOSIS — Z872 Personal history of diseases of the skin and subcutaneous tissue: Secondary | ICD-10-CM | POA: Diagnosis not present

## 2019-07-29 DIAGNOSIS — E11621 Type 2 diabetes mellitus with foot ulcer: Secondary | ICD-10-CM | POA: Diagnosis not present

## 2019-07-29 DIAGNOSIS — L97518 Non-pressure chronic ulcer of other part of right foot with other specified severity: Secondary | ICD-10-CM | POA: Diagnosis not present

## 2019-08-01 DIAGNOSIS — N189 Chronic kidney disease, unspecified: Secondary | ICD-10-CM | POA: Diagnosis not present

## 2019-08-01 DIAGNOSIS — I129 Hypertensive chronic kidney disease with stage 1 through stage 4 chronic kidney disease, or unspecified chronic kidney disease: Secondary | ICD-10-CM | POA: Diagnosis not present

## 2019-08-01 DIAGNOSIS — E1122 Type 2 diabetes mellitus with diabetic chronic kidney disease: Secondary | ICD-10-CM | POA: Diagnosis not present

## 2019-08-01 DIAGNOSIS — Z7984 Long term (current) use of oral hypoglycemic drugs: Secondary | ICD-10-CM | POA: Diagnosis not present

## 2019-08-01 DIAGNOSIS — E11621 Type 2 diabetes mellitus with foot ulcer: Secondary | ICD-10-CM | POA: Diagnosis not present

## 2019-08-01 DIAGNOSIS — L97511 Non-pressure chronic ulcer of other part of right foot limited to breakdown of skin: Secondary | ICD-10-CM | POA: Diagnosis not present

## 2019-08-02 DIAGNOSIS — Z79899 Other long term (current) drug therapy: Secondary | ICD-10-CM | POA: Diagnosis not present

## 2019-08-02 DIAGNOSIS — E1122 Type 2 diabetes mellitus with diabetic chronic kidney disease: Secondary | ICD-10-CM | POA: Diagnosis not present

## 2019-08-02 DIAGNOSIS — R5383 Other fatigue: Secondary | ICD-10-CM | POA: Diagnosis not present

## 2019-08-02 DIAGNOSIS — Z299 Encounter for prophylactic measures, unspecified: Secondary | ICD-10-CM | POA: Diagnosis not present

## 2019-08-02 DIAGNOSIS — E7800 Pure hypercholesterolemia, unspecified: Secondary | ICD-10-CM | POA: Diagnosis not present

## 2019-08-02 DIAGNOSIS — N183 Chronic kidney disease, stage 3 unspecified: Secondary | ICD-10-CM | POA: Diagnosis not present

## 2019-08-02 DIAGNOSIS — I4892 Unspecified atrial flutter: Secondary | ICD-10-CM | POA: Diagnosis not present

## 2019-08-02 DIAGNOSIS — E1165 Type 2 diabetes mellitus with hyperglycemia: Secondary | ICD-10-CM | POA: Diagnosis not present

## 2019-08-02 DIAGNOSIS — C61 Malignant neoplasm of prostate: Secondary | ICD-10-CM | POA: Diagnosis not present

## 2019-08-02 DIAGNOSIS — I10 Essential (primary) hypertension: Secondary | ICD-10-CM | POA: Diagnosis not present

## 2019-08-02 DIAGNOSIS — Z6838 Body mass index (BMI) 38.0-38.9, adult: Secondary | ICD-10-CM | POA: Diagnosis not present

## 2019-08-04 DIAGNOSIS — E11621 Type 2 diabetes mellitus with foot ulcer: Secondary | ICD-10-CM | POA: Diagnosis not present

## 2019-08-04 DIAGNOSIS — Z7984 Long term (current) use of oral hypoglycemic drugs: Secondary | ICD-10-CM | POA: Diagnosis not present

## 2019-08-04 DIAGNOSIS — I129 Hypertensive chronic kidney disease with stage 1 through stage 4 chronic kidney disease, or unspecified chronic kidney disease: Secondary | ICD-10-CM | POA: Diagnosis not present

## 2019-08-04 DIAGNOSIS — N189 Chronic kidney disease, unspecified: Secondary | ICD-10-CM | POA: Diagnosis not present

## 2019-08-04 DIAGNOSIS — L97511 Non-pressure chronic ulcer of other part of right foot limited to breakdown of skin: Secondary | ICD-10-CM | POA: Diagnosis not present

## 2019-08-04 DIAGNOSIS — E1122 Type 2 diabetes mellitus with diabetic chronic kidney disease: Secondary | ICD-10-CM | POA: Diagnosis not present

## 2019-08-07 DIAGNOSIS — E1122 Type 2 diabetes mellitus with diabetic chronic kidney disease: Secondary | ICD-10-CM | POA: Diagnosis not present

## 2019-08-07 DIAGNOSIS — N189 Chronic kidney disease, unspecified: Secondary | ICD-10-CM | POA: Diagnosis not present

## 2019-08-07 DIAGNOSIS — L97511 Non-pressure chronic ulcer of other part of right foot limited to breakdown of skin: Secondary | ICD-10-CM | POA: Diagnosis not present

## 2019-08-07 DIAGNOSIS — I129 Hypertensive chronic kidney disease with stage 1 through stage 4 chronic kidney disease, or unspecified chronic kidney disease: Secondary | ICD-10-CM | POA: Diagnosis not present

## 2019-08-07 DIAGNOSIS — Z7984 Long term (current) use of oral hypoglycemic drugs: Secondary | ICD-10-CM | POA: Diagnosis not present

## 2019-08-07 DIAGNOSIS — E11621 Type 2 diabetes mellitus with foot ulcer: Secondary | ICD-10-CM | POA: Diagnosis not present

## 2019-08-07 DIAGNOSIS — Z89512 Acquired absence of left leg below knee: Secondary | ICD-10-CM | POA: Diagnosis not present

## 2019-08-08 DIAGNOSIS — N189 Chronic kidney disease, unspecified: Secondary | ICD-10-CM | POA: Diagnosis not present

## 2019-08-08 DIAGNOSIS — L97511 Non-pressure chronic ulcer of other part of right foot limited to breakdown of skin: Secondary | ICD-10-CM | POA: Diagnosis not present

## 2019-08-08 DIAGNOSIS — I129 Hypertensive chronic kidney disease with stage 1 through stage 4 chronic kidney disease, or unspecified chronic kidney disease: Secondary | ICD-10-CM | POA: Diagnosis not present

## 2019-08-08 DIAGNOSIS — E1122 Type 2 diabetes mellitus with diabetic chronic kidney disease: Secondary | ICD-10-CM | POA: Diagnosis not present

## 2019-08-08 DIAGNOSIS — E11621 Type 2 diabetes mellitus with foot ulcer: Secondary | ICD-10-CM | POA: Diagnosis not present

## 2019-08-08 DIAGNOSIS — Z7984 Long term (current) use of oral hypoglycemic drugs: Secondary | ICD-10-CM | POA: Diagnosis not present

## 2019-08-10 DIAGNOSIS — E1122 Type 2 diabetes mellitus with diabetic chronic kidney disease: Secondary | ICD-10-CM | POA: Diagnosis not present

## 2019-08-10 DIAGNOSIS — I129 Hypertensive chronic kidney disease with stage 1 through stage 4 chronic kidney disease, or unspecified chronic kidney disease: Secondary | ICD-10-CM | POA: Diagnosis not present

## 2019-08-10 DIAGNOSIS — Z7984 Long term (current) use of oral hypoglycemic drugs: Secondary | ICD-10-CM | POA: Diagnosis not present

## 2019-08-10 DIAGNOSIS — N189 Chronic kidney disease, unspecified: Secondary | ICD-10-CM | POA: Diagnosis not present

## 2019-08-10 DIAGNOSIS — E11621 Type 2 diabetes mellitus with foot ulcer: Secondary | ICD-10-CM | POA: Diagnosis not present

## 2019-08-10 DIAGNOSIS — L97511 Non-pressure chronic ulcer of other part of right foot limited to breakdown of skin: Secondary | ICD-10-CM | POA: Diagnosis not present

## 2019-08-12 DIAGNOSIS — I129 Hypertensive chronic kidney disease with stage 1 through stage 4 chronic kidney disease, or unspecified chronic kidney disease: Secondary | ICD-10-CM | POA: Diagnosis not present

## 2019-08-12 DIAGNOSIS — L97511 Non-pressure chronic ulcer of other part of right foot limited to breakdown of skin: Secondary | ICD-10-CM | POA: Diagnosis not present

## 2019-08-12 DIAGNOSIS — E11621 Type 2 diabetes mellitus with foot ulcer: Secondary | ICD-10-CM | POA: Diagnosis not present

## 2019-08-12 DIAGNOSIS — N189 Chronic kidney disease, unspecified: Secondary | ICD-10-CM | POA: Diagnosis not present

## 2019-08-12 DIAGNOSIS — Z7984 Long term (current) use of oral hypoglycemic drugs: Secondary | ICD-10-CM | POA: Diagnosis not present

## 2019-08-12 DIAGNOSIS — E1122 Type 2 diabetes mellitus with diabetic chronic kidney disease: Secondary | ICD-10-CM | POA: Diagnosis not present

## 2019-08-15 DIAGNOSIS — Z7984 Long term (current) use of oral hypoglycemic drugs: Secondary | ICD-10-CM | POA: Diagnosis not present

## 2019-08-15 DIAGNOSIS — E11621 Type 2 diabetes mellitus with foot ulcer: Secondary | ICD-10-CM | POA: Diagnosis not present

## 2019-08-15 DIAGNOSIS — N189 Chronic kidney disease, unspecified: Secondary | ICD-10-CM | POA: Diagnosis not present

## 2019-08-15 DIAGNOSIS — E119 Type 2 diabetes mellitus without complications: Secondary | ICD-10-CM | POA: Diagnosis not present

## 2019-08-15 DIAGNOSIS — L97511 Non-pressure chronic ulcer of other part of right foot limited to breakdown of skin: Secondary | ICD-10-CM | POA: Diagnosis not present

## 2019-08-15 DIAGNOSIS — I129 Hypertensive chronic kidney disease with stage 1 through stage 4 chronic kidney disease, or unspecified chronic kidney disease: Secondary | ICD-10-CM | POA: Diagnosis not present

## 2019-08-15 DIAGNOSIS — E1122 Type 2 diabetes mellitus with diabetic chronic kidney disease: Secondary | ICD-10-CM | POA: Diagnosis not present

## 2019-08-17 DIAGNOSIS — E11621 Type 2 diabetes mellitus with foot ulcer: Secondary | ICD-10-CM | POA: Diagnosis not present

## 2019-08-17 DIAGNOSIS — N189 Chronic kidney disease, unspecified: Secondary | ICD-10-CM | POA: Diagnosis not present

## 2019-08-17 DIAGNOSIS — E1122 Type 2 diabetes mellitus with diabetic chronic kidney disease: Secondary | ICD-10-CM | POA: Diagnosis not present

## 2019-08-17 DIAGNOSIS — L97511 Non-pressure chronic ulcer of other part of right foot limited to breakdown of skin: Secondary | ICD-10-CM | POA: Diagnosis not present

## 2019-08-17 DIAGNOSIS — I129 Hypertensive chronic kidney disease with stage 1 through stage 4 chronic kidney disease, or unspecified chronic kidney disease: Secondary | ICD-10-CM | POA: Diagnosis not present

## 2019-08-17 DIAGNOSIS — Z7984 Long term (current) use of oral hypoglycemic drugs: Secondary | ICD-10-CM | POA: Diagnosis not present

## 2019-08-19 DIAGNOSIS — L97518 Non-pressure chronic ulcer of other part of right foot with other specified severity: Secondary | ICD-10-CM | POA: Diagnosis not present

## 2019-08-19 DIAGNOSIS — I890 Lymphedema, not elsewhere classified: Secondary | ICD-10-CM | POA: Diagnosis not present

## 2019-08-19 DIAGNOSIS — E11621 Type 2 diabetes mellitus with foot ulcer: Secondary | ICD-10-CM | POA: Diagnosis not present

## 2019-08-22 DIAGNOSIS — I129 Hypertensive chronic kidney disease with stage 1 through stage 4 chronic kidney disease, or unspecified chronic kidney disease: Secondary | ICD-10-CM | POA: Diagnosis not present

## 2019-08-22 DIAGNOSIS — E11621 Type 2 diabetes mellitus with foot ulcer: Secondary | ICD-10-CM | POA: Diagnosis not present

## 2019-08-22 DIAGNOSIS — E1122 Type 2 diabetes mellitus with diabetic chronic kidney disease: Secondary | ICD-10-CM | POA: Diagnosis not present

## 2019-08-22 DIAGNOSIS — Z7984 Long term (current) use of oral hypoglycemic drugs: Secondary | ICD-10-CM | POA: Diagnosis not present

## 2019-08-22 DIAGNOSIS — N189 Chronic kidney disease, unspecified: Secondary | ICD-10-CM | POA: Diagnosis not present

## 2019-08-22 DIAGNOSIS — L97511 Non-pressure chronic ulcer of other part of right foot limited to breakdown of skin: Secondary | ICD-10-CM | POA: Diagnosis not present

## 2019-08-24 DIAGNOSIS — N189 Chronic kidney disease, unspecified: Secondary | ICD-10-CM | POA: Diagnosis not present

## 2019-08-24 DIAGNOSIS — E11621 Type 2 diabetes mellitus with foot ulcer: Secondary | ICD-10-CM | POA: Diagnosis not present

## 2019-08-24 DIAGNOSIS — Z7984 Long term (current) use of oral hypoglycemic drugs: Secondary | ICD-10-CM | POA: Diagnosis not present

## 2019-08-24 DIAGNOSIS — I129 Hypertensive chronic kidney disease with stage 1 through stage 4 chronic kidney disease, or unspecified chronic kidney disease: Secondary | ICD-10-CM | POA: Diagnosis not present

## 2019-08-24 DIAGNOSIS — E1122 Type 2 diabetes mellitus with diabetic chronic kidney disease: Secondary | ICD-10-CM | POA: Diagnosis not present

## 2019-08-24 DIAGNOSIS — L97511 Non-pressure chronic ulcer of other part of right foot limited to breakdown of skin: Secondary | ICD-10-CM | POA: Diagnosis not present

## 2019-08-26 DIAGNOSIS — E11621 Type 2 diabetes mellitus with foot ulcer: Secondary | ICD-10-CM | POA: Diagnosis not present

## 2019-08-26 DIAGNOSIS — I890 Lymphedema, not elsewhere classified: Secondary | ICD-10-CM | POA: Diagnosis not present

## 2019-08-29 DIAGNOSIS — I129 Hypertensive chronic kidney disease with stage 1 through stage 4 chronic kidney disease, or unspecified chronic kidney disease: Secondary | ICD-10-CM | POA: Diagnosis not present

## 2019-08-29 DIAGNOSIS — L97511 Non-pressure chronic ulcer of other part of right foot limited to breakdown of skin: Secondary | ICD-10-CM | POA: Diagnosis not present

## 2019-08-29 DIAGNOSIS — N189 Chronic kidney disease, unspecified: Secondary | ICD-10-CM | POA: Diagnosis not present

## 2019-08-29 DIAGNOSIS — E1122 Type 2 diabetes mellitus with diabetic chronic kidney disease: Secondary | ICD-10-CM | POA: Diagnosis not present

## 2019-08-29 DIAGNOSIS — E11621 Type 2 diabetes mellitus with foot ulcer: Secondary | ICD-10-CM | POA: Diagnosis not present

## 2019-08-29 DIAGNOSIS — Z7984 Long term (current) use of oral hypoglycemic drugs: Secondary | ICD-10-CM | POA: Diagnosis not present

## 2019-08-31 DIAGNOSIS — I129 Hypertensive chronic kidney disease with stage 1 through stage 4 chronic kidney disease, or unspecified chronic kidney disease: Secondary | ICD-10-CM | POA: Diagnosis not present

## 2019-08-31 DIAGNOSIS — E11621 Type 2 diabetes mellitus with foot ulcer: Secondary | ICD-10-CM | POA: Diagnosis not present

## 2019-08-31 DIAGNOSIS — N189 Chronic kidney disease, unspecified: Secondary | ICD-10-CM | POA: Diagnosis not present

## 2019-08-31 DIAGNOSIS — E1122 Type 2 diabetes mellitus with diabetic chronic kidney disease: Secondary | ICD-10-CM | POA: Diagnosis not present

## 2019-08-31 DIAGNOSIS — L97511 Non-pressure chronic ulcer of other part of right foot limited to breakdown of skin: Secondary | ICD-10-CM | POA: Diagnosis not present

## 2019-08-31 DIAGNOSIS — Z7984 Long term (current) use of oral hypoglycemic drugs: Secondary | ICD-10-CM | POA: Diagnosis not present

## 2019-09-02 DIAGNOSIS — E11622 Type 2 diabetes mellitus with other skin ulcer: Secondary | ICD-10-CM | POA: Diagnosis not present

## 2019-09-02 DIAGNOSIS — I890 Lymphedema, not elsewhere classified: Secondary | ICD-10-CM | POA: Diagnosis not present

## 2019-09-02 DIAGNOSIS — E11621 Type 2 diabetes mellitus with foot ulcer: Secondary | ICD-10-CM | POA: Diagnosis not present

## 2019-09-02 DIAGNOSIS — L97919 Non-pressure chronic ulcer of unspecified part of right lower leg with unspecified severity: Secondary | ICD-10-CM | POA: Diagnosis not present

## 2019-09-06 DIAGNOSIS — E11621 Type 2 diabetes mellitus with foot ulcer: Secondary | ICD-10-CM | POA: Diagnosis not present

## 2019-09-06 DIAGNOSIS — N189 Chronic kidney disease, unspecified: Secondary | ICD-10-CM | POA: Diagnosis not present

## 2019-09-06 DIAGNOSIS — Z7984 Long term (current) use of oral hypoglycemic drugs: Secondary | ICD-10-CM | POA: Diagnosis not present

## 2019-09-06 DIAGNOSIS — L97511 Non-pressure chronic ulcer of other part of right foot limited to breakdown of skin: Secondary | ICD-10-CM | POA: Diagnosis not present

## 2019-09-06 DIAGNOSIS — I129 Hypertensive chronic kidney disease with stage 1 through stage 4 chronic kidney disease, or unspecified chronic kidney disease: Secondary | ICD-10-CM | POA: Diagnosis not present

## 2019-09-06 DIAGNOSIS — E1122 Type 2 diabetes mellitus with diabetic chronic kidney disease: Secondary | ICD-10-CM | POA: Diagnosis not present

## 2019-09-06 DIAGNOSIS — Z89512 Acquired absence of left leg below knee: Secondary | ICD-10-CM | POA: Diagnosis not present

## 2019-09-07 DIAGNOSIS — E1122 Type 2 diabetes mellitus with diabetic chronic kidney disease: Secondary | ICD-10-CM | POA: Diagnosis not present

## 2019-09-07 DIAGNOSIS — L97511 Non-pressure chronic ulcer of other part of right foot limited to breakdown of skin: Secondary | ICD-10-CM | POA: Diagnosis not present

## 2019-09-07 DIAGNOSIS — I129 Hypertensive chronic kidney disease with stage 1 through stage 4 chronic kidney disease, or unspecified chronic kidney disease: Secondary | ICD-10-CM | POA: Diagnosis not present

## 2019-09-07 DIAGNOSIS — N189 Chronic kidney disease, unspecified: Secondary | ICD-10-CM | POA: Diagnosis not present

## 2019-09-07 DIAGNOSIS — Z7984 Long term (current) use of oral hypoglycemic drugs: Secondary | ICD-10-CM | POA: Diagnosis not present

## 2019-09-07 DIAGNOSIS — E11621 Type 2 diabetes mellitus with foot ulcer: Secondary | ICD-10-CM | POA: Diagnosis not present

## 2019-09-08 DIAGNOSIS — S88119A Complete traumatic amputation at level between knee and ankle, unspecified lower leg, initial encounter: Secondary | ICD-10-CM | POA: Diagnosis not present

## 2019-09-08 DIAGNOSIS — E1122 Type 2 diabetes mellitus with diabetic chronic kidney disease: Secondary | ICD-10-CM | POA: Diagnosis not present

## 2019-09-08 DIAGNOSIS — N183 Chronic kidney disease, stage 3 unspecified: Secondary | ICD-10-CM | POA: Diagnosis not present

## 2019-09-08 DIAGNOSIS — Z6839 Body mass index (BMI) 39.0-39.9, adult: Secondary | ICD-10-CM | POA: Diagnosis not present

## 2019-09-08 DIAGNOSIS — E1165 Type 2 diabetes mellitus with hyperglycemia: Secondary | ICD-10-CM | POA: Diagnosis not present

## 2019-09-08 DIAGNOSIS — Z299 Encounter for prophylactic measures, unspecified: Secondary | ICD-10-CM | POA: Diagnosis not present

## 2019-09-13 DIAGNOSIS — I129 Hypertensive chronic kidney disease with stage 1 through stage 4 chronic kidney disease, or unspecified chronic kidney disease: Secondary | ICD-10-CM | POA: Diagnosis not present

## 2019-09-13 DIAGNOSIS — E11621 Type 2 diabetes mellitus with foot ulcer: Secondary | ICD-10-CM | POA: Diagnosis not present

## 2019-09-13 DIAGNOSIS — Z7984 Long term (current) use of oral hypoglycemic drugs: Secondary | ICD-10-CM | POA: Diagnosis not present

## 2019-09-13 DIAGNOSIS — L97511 Non-pressure chronic ulcer of other part of right foot limited to breakdown of skin: Secondary | ICD-10-CM | POA: Diagnosis not present

## 2019-09-13 DIAGNOSIS — E1122 Type 2 diabetes mellitus with diabetic chronic kidney disease: Secondary | ICD-10-CM | POA: Diagnosis not present

## 2019-09-13 DIAGNOSIS — N189 Chronic kidney disease, unspecified: Secondary | ICD-10-CM | POA: Diagnosis not present

## 2019-09-20 DIAGNOSIS — Z7984 Long term (current) use of oral hypoglycemic drugs: Secondary | ICD-10-CM | POA: Diagnosis not present

## 2019-09-20 DIAGNOSIS — E11621 Type 2 diabetes mellitus with foot ulcer: Secondary | ICD-10-CM | POA: Diagnosis not present

## 2019-09-20 DIAGNOSIS — N189 Chronic kidney disease, unspecified: Secondary | ICD-10-CM | POA: Diagnosis not present

## 2019-09-20 DIAGNOSIS — I129 Hypertensive chronic kidney disease with stage 1 through stage 4 chronic kidney disease, or unspecified chronic kidney disease: Secondary | ICD-10-CM | POA: Diagnosis not present

## 2019-09-20 DIAGNOSIS — E1122 Type 2 diabetes mellitus with diabetic chronic kidney disease: Secondary | ICD-10-CM | POA: Diagnosis not present

## 2019-09-20 DIAGNOSIS — L97511 Non-pressure chronic ulcer of other part of right foot limited to breakdown of skin: Secondary | ICD-10-CM | POA: Diagnosis not present

## 2019-10-04 DIAGNOSIS — L97511 Non-pressure chronic ulcer of other part of right foot limited to breakdown of skin: Secondary | ICD-10-CM | POA: Diagnosis not present

## 2019-10-04 DIAGNOSIS — I129 Hypertensive chronic kidney disease with stage 1 through stage 4 chronic kidney disease, or unspecified chronic kidney disease: Secondary | ICD-10-CM | POA: Diagnosis not present

## 2019-10-04 DIAGNOSIS — E11621 Type 2 diabetes mellitus with foot ulcer: Secondary | ICD-10-CM | POA: Diagnosis not present

## 2019-10-04 DIAGNOSIS — Z7984 Long term (current) use of oral hypoglycemic drugs: Secondary | ICD-10-CM | POA: Diagnosis not present

## 2019-10-04 DIAGNOSIS — E1122 Type 2 diabetes mellitus with diabetic chronic kidney disease: Secondary | ICD-10-CM | POA: Diagnosis not present

## 2019-10-04 DIAGNOSIS — N189 Chronic kidney disease, unspecified: Secondary | ICD-10-CM | POA: Diagnosis not present

## 2019-10-12 DIAGNOSIS — E119 Type 2 diabetes mellitus without complications: Secondary | ICD-10-CM | POA: Diagnosis not present

## 2019-10-17 DIAGNOSIS — E1122 Type 2 diabetes mellitus with diabetic chronic kidney disease: Secondary | ICD-10-CM | POA: Diagnosis not present

## 2019-10-17 DIAGNOSIS — I129 Hypertensive chronic kidney disease with stage 1 through stage 4 chronic kidney disease, or unspecified chronic kidney disease: Secondary | ICD-10-CM | POA: Diagnosis not present

## 2019-10-17 DIAGNOSIS — L97511 Non-pressure chronic ulcer of other part of right foot limited to breakdown of skin: Secondary | ICD-10-CM | POA: Diagnosis not present

## 2019-10-17 DIAGNOSIS — E11621 Type 2 diabetes mellitus with foot ulcer: Secondary | ICD-10-CM | POA: Diagnosis not present

## 2019-10-21 DIAGNOSIS — Z89512 Acquired absence of left leg below knee: Secondary | ICD-10-CM | POA: Insufficient documentation

## 2019-10-21 DIAGNOSIS — E6609 Other obesity due to excess calories: Secondary | ICD-10-CM | POA: Insufficient documentation

## 2019-10-21 DIAGNOSIS — N2581 Secondary hyperparathyroidism of renal origin: Secondary | ICD-10-CM | POA: Insufficient documentation

## 2019-10-21 DIAGNOSIS — Z89411 Acquired absence of right great toe: Secondary | ICD-10-CM | POA: Insufficient documentation

## 2019-11-15 DIAGNOSIS — E119 Type 2 diabetes mellitus without complications: Secondary | ICD-10-CM | POA: Diagnosis not present

## 2019-11-23 ENCOUNTER — Other Ambulatory Visit: Payer: Self-pay | Admitting: Nephrology

## 2019-11-23 ENCOUNTER — Other Ambulatory Visit (HOSPITAL_COMMUNITY): Payer: Self-pay | Admitting: Nephrology

## 2019-11-23 DIAGNOSIS — I129 Hypertensive chronic kidney disease with stage 1 through stage 4 chronic kidney disease, or unspecified chronic kidney disease: Secondary | ICD-10-CM | POA: Diagnosis not present

## 2019-11-23 DIAGNOSIS — N189 Chronic kidney disease, unspecified: Secondary | ICD-10-CM | POA: Diagnosis not present

## 2019-11-23 DIAGNOSIS — E1129 Type 2 diabetes mellitus with other diabetic kidney complication: Secondary | ICD-10-CM | POA: Diagnosis not present

## 2019-11-23 DIAGNOSIS — R600 Localized edema: Secondary | ICD-10-CM | POA: Diagnosis not present

## 2019-11-23 DIAGNOSIS — I739 Peripheral vascular disease, unspecified: Secondary | ICD-10-CM | POA: Diagnosis not present

## 2019-11-23 DIAGNOSIS — R809 Proteinuria, unspecified: Secondary | ICD-10-CM | POA: Diagnosis not present

## 2019-11-23 DIAGNOSIS — E1122 Type 2 diabetes mellitus with diabetic chronic kidney disease: Secondary | ICD-10-CM | POA: Diagnosis not present

## 2019-11-23 DIAGNOSIS — E6609 Other obesity due to excess calories: Secondary | ICD-10-CM | POA: Diagnosis not present

## 2019-11-23 DIAGNOSIS — D638 Anemia in other chronic diseases classified elsewhere: Secondary | ICD-10-CM | POA: Diagnosis not present

## 2019-11-25 DIAGNOSIS — E1129 Type 2 diabetes mellitus with other diabetic kidney complication: Secondary | ICD-10-CM | POA: Diagnosis not present

## 2019-11-25 DIAGNOSIS — I129 Hypertensive chronic kidney disease with stage 1 through stage 4 chronic kidney disease, or unspecified chronic kidney disease: Secondary | ICD-10-CM | POA: Diagnosis not present

## 2019-11-25 DIAGNOSIS — E559 Vitamin D deficiency, unspecified: Secondary | ICD-10-CM | POA: Diagnosis not present

## 2019-11-25 DIAGNOSIS — D638 Anemia in other chronic diseases classified elsewhere: Secondary | ICD-10-CM | POA: Diagnosis not present

## 2019-11-25 DIAGNOSIS — N189 Chronic kidney disease, unspecified: Secondary | ICD-10-CM | POA: Diagnosis not present

## 2019-11-25 DIAGNOSIS — I739 Peripheral vascular disease, unspecified: Secondary | ICD-10-CM | POA: Diagnosis not present

## 2019-11-25 DIAGNOSIS — Z79899 Other long term (current) drug therapy: Secondary | ICD-10-CM | POA: Diagnosis not present

## 2019-11-25 DIAGNOSIS — R809 Proteinuria, unspecified: Secondary | ICD-10-CM | POA: Diagnosis not present

## 2019-11-25 DIAGNOSIS — E6609 Other obesity due to excess calories: Secondary | ICD-10-CM | POA: Diagnosis not present

## 2019-11-25 DIAGNOSIS — E1122 Type 2 diabetes mellitus with diabetic chronic kidney disease: Secondary | ICD-10-CM | POA: Diagnosis not present

## 2019-11-30 DIAGNOSIS — Z6839 Body mass index (BMI) 39.0-39.9, adult: Secondary | ICD-10-CM | POA: Diagnosis not present

## 2019-11-30 DIAGNOSIS — I10 Essential (primary) hypertension: Secondary | ICD-10-CM | POA: Diagnosis not present

## 2019-11-30 DIAGNOSIS — S88119A Complete traumatic amputation at level between knee and ankle, unspecified lower leg, initial encounter: Secondary | ICD-10-CM | POA: Diagnosis not present

## 2019-11-30 DIAGNOSIS — Z299 Encounter for prophylactic measures, unspecified: Secondary | ICD-10-CM | POA: Diagnosis not present

## 2019-11-30 DIAGNOSIS — E11621 Type 2 diabetes mellitus with foot ulcer: Secondary | ICD-10-CM | POA: Diagnosis not present

## 2019-11-30 DIAGNOSIS — L97509 Non-pressure chronic ulcer of other part of unspecified foot with unspecified severity: Secondary | ICD-10-CM | POA: Diagnosis not present

## 2019-11-30 DIAGNOSIS — E1122 Type 2 diabetes mellitus with diabetic chronic kidney disease: Secondary | ICD-10-CM | POA: Diagnosis not present

## 2019-11-30 DIAGNOSIS — Z789 Other specified health status: Secondary | ICD-10-CM | POA: Diagnosis not present

## 2019-12-06 ENCOUNTER — Other Ambulatory Visit: Payer: Self-pay

## 2019-12-06 ENCOUNTER — Ambulatory Visit (HOSPITAL_COMMUNITY)
Admission: RE | Admit: 2019-12-06 | Discharge: 2019-12-06 | Disposition: A | Discharge status: Home / Self Care | Payer: Medicare Other | Source: Ambulatory Visit | Attending: Nephrology | Admitting: Nephrology

## 2019-12-06 DIAGNOSIS — N189 Chronic kidney disease, unspecified: Secondary | ICD-10-CM | POA: Insufficient documentation

## 2019-12-06 DIAGNOSIS — L97519 Non-pressure chronic ulcer of other part of right foot with unspecified severity: Secondary | ICD-10-CM | POA: Diagnosis not present

## 2019-12-06 DIAGNOSIS — L97518 Non-pressure chronic ulcer of other part of right foot with other specified severity: Secondary | ICD-10-CM | POA: Diagnosis not present

## 2019-12-06 DIAGNOSIS — E11621 Type 2 diabetes mellitus with foot ulcer: Secondary | ICD-10-CM | POA: Diagnosis not present

## 2019-12-06 DIAGNOSIS — N281 Cyst of kidney, acquired: Secondary | ICD-10-CM | POA: Diagnosis not present

## 2019-12-08 DIAGNOSIS — N1832 Chronic kidney disease, stage 3b: Secondary | ICD-10-CM | POA: Diagnosis not present

## 2019-12-08 DIAGNOSIS — E6609 Other obesity due to excess calories: Secondary | ICD-10-CM | POA: Diagnosis not present

## 2019-12-08 DIAGNOSIS — E1151 Type 2 diabetes mellitus with diabetic peripheral angiopathy without gangrene: Secondary | ICD-10-CM | POA: Diagnosis not present

## 2019-12-08 DIAGNOSIS — I129 Hypertensive chronic kidney disease with stage 1 through stage 4 chronic kidney disease, or unspecified chronic kidney disease: Secondary | ICD-10-CM | POA: Diagnosis not present

## 2019-12-08 DIAGNOSIS — E7849 Other hyperlipidemia: Secondary | ICD-10-CM | POA: Diagnosis not present

## 2019-12-08 DIAGNOSIS — D631 Anemia in chronic kidney disease: Secondary | ICD-10-CM | POA: Diagnosis not present

## 2019-12-08 DIAGNOSIS — E1129 Type 2 diabetes mellitus with other diabetic kidney complication: Secondary | ICD-10-CM | POA: Diagnosis not present

## 2019-12-08 DIAGNOSIS — Z7984 Long term (current) use of oral hypoglycemic drugs: Secondary | ICD-10-CM | POA: Diagnosis not present

## 2019-12-08 DIAGNOSIS — N2581 Secondary hyperparathyroidism of renal origin: Secondary | ICD-10-CM | POA: Diagnosis not present

## 2019-12-08 DIAGNOSIS — Z89512 Acquired absence of left leg below knee: Secondary | ICD-10-CM | POA: Diagnosis not present

## 2019-12-08 DIAGNOSIS — E11621 Type 2 diabetes mellitus with foot ulcer: Secondary | ICD-10-CM | POA: Diagnosis not present

## 2019-12-08 DIAGNOSIS — L97511 Non-pressure chronic ulcer of other part of right foot limited to breakdown of skin: Secondary | ICD-10-CM | POA: Diagnosis not present

## 2019-12-08 DIAGNOSIS — E1122 Type 2 diabetes mellitus with diabetic chronic kidney disease: Secondary | ICD-10-CM | POA: Diagnosis not present

## 2019-12-08 DIAGNOSIS — F419 Anxiety disorder, unspecified: Secondary | ICD-10-CM | POA: Diagnosis not present

## 2019-12-12 DIAGNOSIS — E11621 Type 2 diabetes mellitus with foot ulcer: Secondary | ICD-10-CM | POA: Diagnosis not present

## 2019-12-12 DIAGNOSIS — M19071 Primary osteoarthritis, right ankle and foot: Secondary | ICD-10-CM | POA: Diagnosis not present

## 2019-12-12 DIAGNOSIS — L97518 Non-pressure chronic ulcer of other part of right foot with other specified severity: Secondary | ICD-10-CM | POA: Diagnosis not present

## 2019-12-13 DIAGNOSIS — E1122 Type 2 diabetes mellitus with diabetic chronic kidney disease: Secondary | ICD-10-CM | POA: Diagnosis not present

## 2019-12-13 DIAGNOSIS — N1832 Chronic kidney disease, stage 3b: Secondary | ICD-10-CM | POA: Diagnosis not present

## 2019-12-13 DIAGNOSIS — L97511 Non-pressure chronic ulcer of other part of right foot limited to breakdown of skin: Secondary | ICD-10-CM | POA: Diagnosis not present

## 2019-12-13 DIAGNOSIS — E1151 Type 2 diabetes mellitus with diabetic peripheral angiopathy without gangrene: Secondary | ICD-10-CM | POA: Diagnosis not present

## 2019-12-13 DIAGNOSIS — I129 Hypertensive chronic kidney disease with stage 1 through stage 4 chronic kidney disease, or unspecified chronic kidney disease: Secondary | ICD-10-CM | POA: Diagnosis not present

## 2019-12-13 DIAGNOSIS — E11621 Type 2 diabetes mellitus with foot ulcer: Secondary | ICD-10-CM | POA: Diagnosis not present

## 2019-12-14 DIAGNOSIS — Z23 Encounter for immunization: Secondary | ICD-10-CM | POA: Diagnosis not present

## 2019-12-16 DIAGNOSIS — E1122 Type 2 diabetes mellitus with diabetic chronic kidney disease: Secondary | ICD-10-CM | POA: Diagnosis not present

## 2019-12-16 DIAGNOSIS — I129 Hypertensive chronic kidney disease with stage 1 through stage 4 chronic kidney disease, or unspecified chronic kidney disease: Secondary | ICD-10-CM | POA: Diagnosis not present

## 2019-12-16 DIAGNOSIS — E11621 Type 2 diabetes mellitus with foot ulcer: Secondary | ICD-10-CM | POA: Diagnosis not present

## 2019-12-16 DIAGNOSIS — N1832 Chronic kidney disease, stage 3b: Secondary | ICD-10-CM | POA: Diagnosis not present

## 2019-12-16 DIAGNOSIS — L97511 Non-pressure chronic ulcer of other part of right foot limited to breakdown of skin: Secondary | ICD-10-CM | POA: Diagnosis not present

## 2019-12-16 DIAGNOSIS — E1151 Type 2 diabetes mellitus with diabetic peripheral angiopathy without gangrene: Secondary | ICD-10-CM | POA: Diagnosis not present

## 2019-12-18 DIAGNOSIS — E1165 Type 2 diabetes mellitus with hyperglycemia: Secondary | ICD-10-CM | POA: Diagnosis not present

## 2019-12-20 DIAGNOSIS — L97511 Non-pressure chronic ulcer of other part of right foot limited to breakdown of skin: Secondary | ICD-10-CM | POA: Diagnosis not present

## 2019-12-20 DIAGNOSIS — E1122 Type 2 diabetes mellitus with diabetic chronic kidney disease: Secondary | ICD-10-CM | POA: Diagnosis not present

## 2019-12-20 DIAGNOSIS — E11621 Type 2 diabetes mellitus with foot ulcer: Secondary | ICD-10-CM | POA: Diagnosis not present

## 2019-12-20 DIAGNOSIS — I129 Hypertensive chronic kidney disease with stage 1 through stage 4 chronic kidney disease, or unspecified chronic kidney disease: Secondary | ICD-10-CM | POA: Diagnosis not present

## 2019-12-20 DIAGNOSIS — E1151 Type 2 diabetes mellitus with diabetic peripheral angiopathy without gangrene: Secondary | ICD-10-CM | POA: Diagnosis not present

## 2019-12-20 DIAGNOSIS — N1832 Chronic kidney disease, stage 3b: Secondary | ICD-10-CM | POA: Diagnosis not present

## 2019-12-22 DIAGNOSIS — L97511 Non-pressure chronic ulcer of other part of right foot limited to breakdown of skin: Secondary | ICD-10-CM | POA: Diagnosis not present

## 2019-12-22 DIAGNOSIS — E11621 Type 2 diabetes mellitus with foot ulcer: Secondary | ICD-10-CM | POA: Diagnosis not present

## 2019-12-22 DIAGNOSIS — N1832 Chronic kidney disease, stage 3b: Secondary | ICD-10-CM | POA: Diagnosis not present

## 2019-12-22 DIAGNOSIS — E1122 Type 2 diabetes mellitus with diabetic chronic kidney disease: Secondary | ICD-10-CM | POA: Diagnosis not present

## 2019-12-22 DIAGNOSIS — I129 Hypertensive chronic kidney disease with stage 1 through stage 4 chronic kidney disease, or unspecified chronic kidney disease: Secondary | ICD-10-CM | POA: Diagnosis not present

## 2019-12-22 DIAGNOSIS — E1151 Type 2 diabetes mellitus with diabetic peripheral angiopathy without gangrene: Secondary | ICD-10-CM | POA: Diagnosis not present

## 2019-12-23 DIAGNOSIS — R5383 Other fatigue: Secondary | ICD-10-CM | POA: Diagnosis not present

## 2019-12-23 DIAGNOSIS — C61 Malignant neoplasm of prostate: Secondary | ICD-10-CM | POA: Diagnosis not present

## 2019-12-23 DIAGNOSIS — R9721 Rising PSA following treatment for malignant neoplasm of prostate: Secondary | ICD-10-CM | POA: Diagnosis not present

## 2019-12-23 DIAGNOSIS — N400 Enlarged prostate without lower urinary tract symptoms: Secondary | ICD-10-CM | POA: Diagnosis not present

## 2019-12-23 DIAGNOSIS — N281 Cyst of kidney, acquired: Secondary | ICD-10-CM | POA: Diagnosis not present

## 2019-12-27 DIAGNOSIS — H40013 Open angle with borderline findings, low risk, bilateral: Secondary | ICD-10-CM | POA: Diagnosis not present

## 2019-12-27 DIAGNOSIS — I129 Hypertensive chronic kidney disease with stage 1 through stage 4 chronic kidney disease, or unspecified chronic kidney disease: Secondary | ICD-10-CM | POA: Diagnosis not present

## 2019-12-27 DIAGNOSIS — H524 Presbyopia: Secondary | ICD-10-CM | POA: Diagnosis not present

## 2019-12-27 DIAGNOSIS — E11621 Type 2 diabetes mellitus with foot ulcer: Secondary | ICD-10-CM | POA: Diagnosis not present

## 2019-12-27 DIAGNOSIS — H52223 Regular astigmatism, bilateral: Secondary | ICD-10-CM | POA: Diagnosis not present

## 2019-12-27 DIAGNOSIS — H5213 Myopia, bilateral: Secondary | ICD-10-CM | POA: Diagnosis not present

## 2019-12-27 DIAGNOSIS — H43393 Other vitreous opacities, bilateral: Secondary | ICD-10-CM | POA: Diagnosis not present

## 2019-12-27 DIAGNOSIS — Z7984 Long term (current) use of oral hypoglycemic drugs: Secondary | ICD-10-CM | POA: Diagnosis not present

## 2019-12-27 DIAGNOSIS — I10 Essential (primary) hypertension: Secondary | ICD-10-CM | POA: Diagnosis not present

## 2019-12-27 DIAGNOSIS — E1122 Type 2 diabetes mellitus with diabetic chronic kidney disease: Secondary | ICD-10-CM | POA: Diagnosis not present

## 2019-12-27 DIAGNOSIS — E119 Type 2 diabetes mellitus without complications: Secondary | ICD-10-CM | POA: Diagnosis not present

## 2019-12-27 DIAGNOSIS — E1151 Type 2 diabetes mellitus with diabetic peripheral angiopathy without gangrene: Secondary | ICD-10-CM | POA: Diagnosis not present

## 2019-12-27 DIAGNOSIS — L97511 Non-pressure chronic ulcer of other part of right foot limited to breakdown of skin: Secondary | ICD-10-CM | POA: Diagnosis not present

## 2019-12-27 DIAGNOSIS — H04123 Dry eye syndrome of bilateral lacrimal glands: Secondary | ICD-10-CM | POA: Diagnosis not present

## 2019-12-27 DIAGNOSIS — N1832 Chronic kidney disease, stage 3b: Secondary | ICD-10-CM | POA: Diagnosis not present

## 2019-12-27 DIAGNOSIS — H35033 Hypertensive retinopathy, bilateral: Secondary | ICD-10-CM | POA: Diagnosis not present

## 2019-12-30 DIAGNOSIS — L97513 Non-pressure chronic ulcer of other part of right foot with necrosis of muscle: Secondary | ICD-10-CM | POA: Diagnosis not present

## 2019-12-30 DIAGNOSIS — E1169 Type 2 diabetes mellitus with other specified complication: Secondary | ICD-10-CM | POA: Diagnosis not present

## 2019-12-30 DIAGNOSIS — M86671 Other chronic osteomyelitis, right ankle and foot: Secondary | ICD-10-CM | POA: Diagnosis not present

## 2019-12-30 DIAGNOSIS — L97518 Non-pressure chronic ulcer of other part of right foot with other specified severity: Secondary | ICD-10-CM | POA: Diagnosis not present

## 2019-12-30 DIAGNOSIS — L84 Corns and callosities: Secondary | ICD-10-CM | POA: Diagnosis not present

## 2019-12-30 DIAGNOSIS — E11621 Type 2 diabetes mellitus with foot ulcer: Secondary | ICD-10-CM | POA: Diagnosis not present

## 2020-01-03 DIAGNOSIS — E1122 Type 2 diabetes mellitus with diabetic chronic kidney disease: Secondary | ICD-10-CM | POA: Diagnosis not present

## 2020-01-03 DIAGNOSIS — I129 Hypertensive chronic kidney disease with stage 1 through stage 4 chronic kidney disease, or unspecified chronic kidney disease: Secondary | ICD-10-CM | POA: Diagnosis not present

## 2020-01-03 DIAGNOSIS — E11621 Type 2 diabetes mellitus with foot ulcer: Secondary | ICD-10-CM | POA: Diagnosis not present

## 2020-01-03 DIAGNOSIS — L97511 Non-pressure chronic ulcer of other part of right foot limited to breakdown of skin: Secondary | ICD-10-CM | POA: Diagnosis not present

## 2020-01-03 DIAGNOSIS — N1832 Chronic kidney disease, stage 3b: Secondary | ICD-10-CM | POA: Diagnosis not present

## 2020-01-03 DIAGNOSIS — E1151 Type 2 diabetes mellitus with diabetic peripheral angiopathy without gangrene: Secondary | ICD-10-CM | POA: Diagnosis not present

## 2020-01-05 DIAGNOSIS — E1151 Type 2 diabetes mellitus with diabetic peripheral angiopathy without gangrene: Secondary | ICD-10-CM | POA: Diagnosis not present

## 2020-01-05 DIAGNOSIS — I129 Hypertensive chronic kidney disease with stage 1 through stage 4 chronic kidney disease, or unspecified chronic kidney disease: Secondary | ICD-10-CM | POA: Diagnosis not present

## 2020-01-05 DIAGNOSIS — N1832 Chronic kidney disease, stage 3b: Secondary | ICD-10-CM | POA: Diagnosis not present

## 2020-01-05 DIAGNOSIS — L97511 Non-pressure chronic ulcer of other part of right foot limited to breakdown of skin: Secondary | ICD-10-CM | POA: Diagnosis not present

## 2020-01-05 DIAGNOSIS — E1122 Type 2 diabetes mellitus with diabetic chronic kidney disease: Secondary | ICD-10-CM | POA: Diagnosis not present

## 2020-01-05 DIAGNOSIS — E11621 Type 2 diabetes mellitus with foot ulcer: Secondary | ICD-10-CM | POA: Diagnosis not present

## 2020-01-06 DIAGNOSIS — D472 Monoclonal gammopathy: Secondary | ICD-10-CM | POA: Diagnosis not present

## 2020-01-06 DIAGNOSIS — D638 Anemia in other chronic diseases classified elsewhere: Secondary | ICD-10-CM | POA: Diagnosis not present

## 2020-01-06 DIAGNOSIS — E1122 Type 2 diabetes mellitus with diabetic chronic kidney disease: Secondary | ICD-10-CM | POA: Diagnosis not present

## 2020-01-06 DIAGNOSIS — N189 Chronic kidney disease, unspecified: Secondary | ICD-10-CM | POA: Diagnosis not present

## 2020-01-06 DIAGNOSIS — R809 Proteinuria, unspecified: Secondary | ICD-10-CM | POA: Diagnosis not present

## 2020-01-06 DIAGNOSIS — E211 Secondary hyperparathyroidism, not elsewhere classified: Secondary | ICD-10-CM | POA: Diagnosis not present

## 2020-01-06 DIAGNOSIS — N200 Calculus of kidney: Secondary | ICD-10-CM | POA: Diagnosis not present

## 2020-01-06 DIAGNOSIS — E1129 Type 2 diabetes mellitus with other diabetic kidney complication: Secondary | ICD-10-CM | POA: Diagnosis not present

## 2020-01-06 DIAGNOSIS — R600 Localized edema: Secondary | ICD-10-CM | POA: Diagnosis not present

## 2020-01-06 DIAGNOSIS — I129 Hypertensive chronic kidney disease with stage 1 through stage 4 chronic kidney disease, or unspecified chronic kidney disease: Secondary | ICD-10-CM | POA: Diagnosis not present

## 2020-01-06 DIAGNOSIS — I739 Peripheral vascular disease, unspecified: Secondary | ICD-10-CM | POA: Diagnosis not present

## 2020-01-07 DIAGNOSIS — I129 Hypertensive chronic kidney disease with stage 1 through stage 4 chronic kidney disease, or unspecified chronic kidney disease: Secondary | ICD-10-CM | POA: Diagnosis not present

## 2020-01-07 DIAGNOSIS — E1122 Type 2 diabetes mellitus with diabetic chronic kidney disease: Secondary | ICD-10-CM | POA: Diagnosis not present

## 2020-01-07 DIAGNOSIS — E1129 Type 2 diabetes mellitus with other diabetic kidney complication: Secondary | ICD-10-CM | POA: Diagnosis not present

## 2020-01-07 DIAGNOSIS — D631 Anemia in chronic kidney disease: Secondary | ICD-10-CM | POA: Diagnosis not present

## 2020-01-07 DIAGNOSIS — L97511 Non-pressure chronic ulcer of other part of right foot limited to breakdown of skin: Secondary | ICD-10-CM | POA: Diagnosis not present

## 2020-01-07 DIAGNOSIS — N2581 Secondary hyperparathyroidism of renal origin: Secondary | ICD-10-CM | POA: Diagnosis not present

## 2020-01-07 DIAGNOSIS — E6609 Other obesity due to excess calories: Secondary | ICD-10-CM | POA: Diagnosis not present

## 2020-01-07 DIAGNOSIS — E1151 Type 2 diabetes mellitus with diabetic peripheral angiopathy without gangrene: Secondary | ICD-10-CM | POA: Diagnosis not present

## 2020-01-07 DIAGNOSIS — E11621 Type 2 diabetes mellitus with foot ulcer: Secondary | ICD-10-CM | POA: Diagnosis not present

## 2020-01-07 DIAGNOSIS — N1832 Chronic kidney disease, stage 3b: Secondary | ICD-10-CM | POA: Diagnosis not present

## 2020-01-07 DIAGNOSIS — Z89512 Acquired absence of left leg below knee: Secondary | ICD-10-CM | POA: Diagnosis not present

## 2020-01-07 DIAGNOSIS — Z7984 Long term (current) use of oral hypoglycemic drugs: Secondary | ICD-10-CM | POA: Diagnosis not present

## 2020-01-07 DIAGNOSIS — E7849 Other hyperlipidemia: Secondary | ICD-10-CM | POA: Diagnosis not present

## 2020-01-07 DIAGNOSIS — F419 Anxiety disorder, unspecified: Secondary | ICD-10-CM | POA: Diagnosis not present

## 2020-01-10 ENCOUNTER — Encounter: Payer: Self-pay | Admitting: Internal Medicine

## 2020-01-10 DIAGNOSIS — N1832 Chronic kidney disease, stage 3b: Secondary | ICD-10-CM | POA: Diagnosis not present

## 2020-01-10 DIAGNOSIS — I129 Hypertensive chronic kidney disease with stage 1 through stage 4 chronic kidney disease, or unspecified chronic kidney disease: Secondary | ICD-10-CM | POA: Diagnosis not present

## 2020-01-10 DIAGNOSIS — L97511 Non-pressure chronic ulcer of other part of right foot limited to breakdown of skin: Secondary | ICD-10-CM | POA: Diagnosis not present

## 2020-01-10 DIAGNOSIS — E11621 Type 2 diabetes mellitus with foot ulcer: Secondary | ICD-10-CM | POA: Diagnosis not present

## 2020-01-10 DIAGNOSIS — E1122 Type 2 diabetes mellitus with diabetic chronic kidney disease: Secondary | ICD-10-CM | POA: Diagnosis not present

## 2020-01-10 DIAGNOSIS — E1151 Type 2 diabetes mellitus with diabetic peripheral angiopathy without gangrene: Secondary | ICD-10-CM | POA: Diagnosis not present

## 2020-01-12 DIAGNOSIS — N1832 Chronic kidney disease, stage 3b: Secondary | ICD-10-CM | POA: Diagnosis not present

## 2020-01-12 DIAGNOSIS — D509 Iron deficiency anemia, unspecified: Secondary | ICD-10-CM | POA: Diagnosis not present

## 2020-01-13 DIAGNOSIS — L97511 Non-pressure chronic ulcer of other part of right foot limited to breakdown of skin: Secondary | ICD-10-CM | POA: Diagnosis not present

## 2020-01-13 DIAGNOSIS — N1832 Chronic kidney disease, stage 3b: Secondary | ICD-10-CM | POA: Diagnosis not present

## 2020-01-13 DIAGNOSIS — E1122 Type 2 diabetes mellitus with diabetic chronic kidney disease: Secondary | ICD-10-CM | POA: Diagnosis not present

## 2020-01-13 DIAGNOSIS — I129 Hypertensive chronic kidney disease with stage 1 through stage 4 chronic kidney disease, or unspecified chronic kidney disease: Secondary | ICD-10-CM | POA: Diagnosis not present

## 2020-01-13 DIAGNOSIS — Z23 Encounter for immunization: Secondary | ICD-10-CM | POA: Diagnosis not present

## 2020-01-13 DIAGNOSIS — E1151 Type 2 diabetes mellitus with diabetic peripheral angiopathy without gangrene: Secondary | ICD-10-CM | POA: Diagnosis not present

## 2020-01-13 DIAGNOSIS — E11621 Type 2 diabetes mellitus with foot ulcer: Secondary | ICD-10-CM | POA: Diagnosis not present

## 2020-01-17 DIAGNOSIS — L97511 Non-pressure chronic ulcer of other part of right foot limited to breakdown of skin: Secondary | ICD-10-CM | POA: Diagnosis not present

## 2020-01-17 DIAGNOSIS — E119 Type 2 diabetes mellitus without complications: Secondary | ICD-10-CM | POA: Diagnosis not present

## 2020-01-17 DIAGNOSIS — I129 Hypertensive chronic kidney disease with stage 1 through stage 4 chronic kidney disease, or unspecified chronic kidney disease: Secondary | ICD-10-CM | POA: Diagnosis not present

## 2020-01-17 DIAGNOSIS — E11621 Type 2 diabetes mellitus with foot ulcer: Secondary | ICD-10-CM | POA: Diagnosis not present

## 2020-01-17 DIAGNOSIS — N1832 Chronic kidney disease, stage 3b: Secondary | ICD-10-CM | POA: Diagnosis not present

## 2020-01-17 DIAGNOSIS — E1151 Type 2 diabetes mellitus with diabetic peripheral angiopathy without gangrene: Secondary | ICD-10-CM | POA: Diagnosis not present

## 2020-01-17 DIAGNOSIS — E1122 Type 2 diabetes mellitus with diabetic chronic kidney disease: Secondary | ICD-10-CM | POA: Diagnosis not present

## 2020-01-20 DIAGNOSIS — L97511 Non-pressure chronic ulcer of other part of right foot limited to breakdown of skin: Secondary | ICD-10-CM | POA: Diagnosis not present

## 2020-01-20 DIAGNOSIS — I129 Hypertensive chronic kidney disease with stage 1 through stage 4 chronic kidney disease, or unspecified chronic kidney disease: Secondary | ICD-10-CM | POA: Diagnosis not present

## 2020-01-20 DIAGNOSIS — E11621 Type 2 diabetes mellitus with foot ulcer: Secondary | ICD-10-CM | POA: Diagnosis not present

## 2020-01-20 DIAGNOSIS — N1832 Chronic kidney disease, stage 3b: Secondary | ICD-10-CM | POA: Diagnosis not present

## 2020-01-20 DIAGNOSIS — E1122 Type 2 diabetes mellitus with diabetic chronic kidney disease: Secondary | ICD-10-CM | POA: Diagnosis not present

## 2020-01-20 DIAGNOSIS — E1151 Type 2 diabetes mellitus with diabetic peripheral angiopathy without gangrene: Secondary | ICD-10-CM | POA: Diagnosis not present

## 2020-01-24 DIAGNOSIS — I129 Hypertensive chronic kidney disease with stage 1 through stage 4 chronic kidney disease, or unspecified chronic kidney disease: Secondary | ICD-10-CM | POA: Diagnosis not present

## 2020-01-24 DIAGNOSIS — L97511 Non-pressure chronic ulcer of other part of right foot limited to breakdown of skin: Secondary | ICD-10-CM | POA: Diagnosis not present

## 2020-01-24 DIAGNOSIS — E1151 Type 2 diabetes mellitus with diabetic peripheral angiopathy without gangrene: Secondary | ICD-10-CM | POA: Diagnosis not present

## 2020-01-24 DIAGNOSIS — E119 Type 2 diabetes mellitus without complications: Secondary | ICD-10-CM | POA: Diagnosis not present

## 2020-01-24 DIAGNOSIS — E11621 Type 2 diabetes mellitus with foot ulcer: Secondary | ICD-10-CM | POA: Diagnosis not present

## 2020-01-24 DIAGNOSIS — E1122 Type 2 diabetes mellitus with diabetic chronic kidney disease: Secondary | ICD-10-CM | POA: Diagnosis not present

## 2020-01-24 DIAGNOSIS — N1832 Chronic kidney disease, stage 3b: Secondary | ICD-10-CM | POA: Diagnosis not present

## 2020-01-24 DIAGNOSIS — E7800 Pure hypercholesterolemia, unspecified: Secondary | ICD-10-CM | POA: Diagnosis not present

## 2020-01-25 ENCOUNTER — Ambulatory Visit (INDEPENDENT_AMBULATORY_CARE_PROVIDER_SITE_OTHER): Payer: Medicare Other | Admitting: Nurse Practitioner

## 2020-01-25 ENCOUNTER — Encounter: Payer: Self-pay | Admitting: *Deleted

## 2020-01-25 ENCOUNTER — Encounter: Payer: Self-pay | Admitting: Nurse Practitioner

## 2020-01-25 ENCOUNTER — Other Ambulatory Visit: Payer: Self-pay

## 2020-01-25 DIAGNOSIS — N1832 Chronic kidney disease, stage 3b: Secondary | ICD-10-CM | POA: Diagnosis not present

## 2020-01-25 DIAGNOSIS — D509 Iron deficiency anemia, unspecified: Secondary | ICD-10-CM | POA: Diagnosis not present

## 2020-01-25 NOTE — Assessment & Plan Note (Signed)
Patient referred from nephrology for iron deficiency anemia iron 22, ferritin 14, iron saturation 6%.  With hemoglobin at 9.2, chronic kidney disease as well likely contributing to his overall anemia picture.  And nephrology notes, it is interesting that he noted he was having black stools about 3 weeks however he states this is since resolved and he has not had any in 2 months.  Previous history of "likely" stomach ulcer from frequent aspirin product use.  He is currently been using Alka-Seltzer/aspirin product.  Last colonoscopy 4 years ago by Dr. Ladona Horns, states he never received the results or recommendations.  We will request reports from [place], [place].  He was having symptomatic anemia with dyspnea and some lightheadedness, however this improved with iron infusions x2 (the last of which was this morning).  At this point given his iron deficiency anemia and colonoscopy around 4 years ago we will proceed with colonoscopy and upper endoscopy to evaluate for source of IDA.  If no findings consistent with his anemia we may need to consider Givens capsule endoscopy in the future.  Follow-up in 2 months.  Proceed with colonoscopy and upper endoscopy on propofol/MAC with Dr. Oneida Alar in the near future. The risks, benefits, and alternatives have been discussed in detail with the patient. They state understanding and desire to proceed.   The patient is currently on Xanax, Celexa, Neurontin, Glucophage. The patient is not on any other anticoagulants, anxiolytics, chronic pain medications, antidepressants, antidiabetics, or iron supplements.  Rare/social alcohol use, denies drug use.  We will plan for the procedure on propofol/MAC to promote adequate sedation.

## 2020-01-25 NOTE — Patient Instructions (Signed)
Your health issues we discussed today were:   Iron deficiency anemia and black stools, previous likely ulcer: 1. Given your anemia with associated shortness of breath we will plan for an upper endoscopy and colonoscopy 2. Further recommendations will follow your procedures 3. If you have any worsening shortness of breath, weakness, fatigue, lightheadedness, dizziness, passing out, nearly passing out seek immediate medical care 4. Call us if you have any recurrent or worsening black stools or any red blood in your stools  Overall I recommend:  1. Continue your other current medications 2. Return for follow-up in 2 months 3. Call us if you have any questions or concerns.   ---------------------------------------------------------------  I am glad you received your coronavirus vaccines [place] even though you are fully vaccinated continue to wear a mask, socially distance from those you do not live with, and wash your hands frequently.  ---------------------------------------------------------------   At [facility] we value your feedback. You may receive a survey about your visit today. Please share your experience as we strive to create trusting relationships with our patients to provide genuine, compassionate, quality care.  We appreciate your understanding and patience as we review any laboratory studies, imaging, and other diagnostic tests that are ordered as we care for you. Our office policy is 5 business days for review of these results, and any emergent or urgent results are addressed in a timely manner for your best interest. If you do not hear from our office in 1 week, please contact us.   We also encourage the use of MyChart, which contains your medical information for your review as well. If you are not enrolled in this feature, an access code is on this after visit summary for your convenience. Thank you for allowing [place] to be involved in your care.  It  was great to see you today!  I hope you have a great Summer!!

## 2020-01-25 NOTE — Progress Notes (Signed)
Primary Care Physician:  Monico Blitz, MD Primary Gastroenterologist:  Dr. Oneida Alar  Chief Complaint  Patient presents with  . Colonoscopy    last tcs 4 yrs ago, Dr. Ladona Horns  . Anemia    HPI:   [name] [name] is a 74 y.o. male who presents on referral from nephrology for iron deficiency anemia.  Reviewed information associated with referral including nephrology office visit dated 01/06/2020 from [facility] obtained through [facility].  Noted history of hypertension, chronic kidney disease.  Noted diabetes and high blood pressure for 25+ years.  At that time he noted he began having black stools around 3 weeks prior and now much better.  Worsening dyspnea on exertion over the past few months.  Labs noted hemoglobin low at 9.2, platelets normal.  Iron low at 22, ferritin low at 14, iron saturation low at 6%.  His creatinine was stable/improved at 1.6.  The patient was given IV iron for his IDA.  No history of colonoscopy or endoscopy found in our system.  Today he states he is doing okay overall.  Last colonoscopy was 4 years ago by Dr. Ladona Horns (report not available in epic EMR).  In the 1990's he had (likely) an ulcer from too much Alka-Seltzer. He notes he has been using some Alka-seltzer recently in addition to aspirin. He has never had an upper endoscopy. Denies abdominal pain, N/V, hematochezia, fever, chills, unintentional weight loss. Denies URI or flu-like symptoms. Denies loss of sense of taste or smell. He has had both Coronavirus vaccine doses. He did have some dyspnea in December which was attributed to CKD and anemia, has since improved after iron transfusion (second one was this morning). Denies chest pain, dyspnea, dizziness, lightheadedness, syncope, near syncope. Denies any other upper or lower GI symptoms.  Past Medical History:  Diagnosis Date  . Cancer [facility])    Prostate- Stage I  . Critical lower limb ischemia   . Essential hypertension   .  Gangrene of foot [facility]) March 2016   Left foot  . Hyperlipidemia   . Type 2 diabetes mellitus ([place])     Past Surgical History:  Procedure Laterality Date  . AMPUTATION Left 01/12/2015   Procedure: AMPUTATION BELOW KNEE;  Surgeon: Leandrew Koyanagi, MD;  Location: [facility];  Service: Orthopedics;  Laterality: Left;  . APPLICATION OF WOUND VAC Right 01/12/2015   Procedure: APPLICATION OF WOUND VAC;  Surgeon: Leandrew Koyanagi, MD;  Location: [facility];  Service: Orthopedics;  Laterality: Right;    Current Outpatient Medications  Medication Sig Dispense Refill  . acetaminophen (TYLENOL) 325 MG tablet Take 2 tablets (650 mg total) by mouth every 6 (six) hours as needed for mild pain (or Fever >/= 101).    Marland Kitchen ALPRAZolam (XANAX) 1 MG tablet Take 1.5 mg by mouth at bedtime.    Marland Kitchen ascorbic acid (VITAMIN C) 1000 MG tablet Take 1 tablet by mouth daily.    Marland Kitchen aspirin EC 81 MG tablet Take 81 mg by mouth daily.    . calcium-vitamin D (OSCAL WITH D) 500-200 MG-UNIT TABS tablet Take 1 tablet by mouth daily.    . citalopram (CELEXA) 40 MG tablet Take 40 mg by mouth daily.    . finasteride (PROSCAR) 5 MG tablet Take 1 tablet by mouth daily.    . furosemide (LASIX) 40 MG tablet Take 40 mg by mouth 2 (two) times daily.    Marland Kitchen gabapentin (NEURONTIN) 100 MG capsule Take 100 mg by mouth 3 (  three) times daily.    Marland Kitchen losartan (COZAAR) 100 MG tablet Take 100 mg by mouth daily.    . metFORMIN (GLUCOPHAGE) 500 MG tablet Take 500 mg by mouth 2 (two) times daily.    . metoprolol (LOPRESSOR) 100 MG tablet Take 100 mg by mouth 2 (two) times daily.    . Multiple Vitamin (MULTIVITAMIN) capsule Take 1 capsule by mouth daily.    . Multiple Vitamins-Minerals (MULTIVITAMIN WITH MINERALS) tablet Take 1 tablet by mouth daily.    . mupirocin ointment (BACTROBAN) 2 % Place 1 application into the nose 2 (two) times daily. Use through 01/18/2015 (Patient taking differently: Place 1 application into the nose 2 (two) times daily. ) 22 g 0  . Omega-3 Fatty  Acids (FISH OIL) 1000 MG CAPS Take 1 capsule by mouth daily.    . simvastatin (ZOCOR) 40 MG tablet Take 40 mg by mouth daily.    Marland Kitchen sulfamethoxazole-trimethoprim (BACTRIM DS) 800-160 MG tablet Take 1 tablet by mouth 2 (two) times daily.    . tamsulosin (FLOMAX) 0.4 MG CAPS capsule Take 0.4 mg by mouth daily.    . vitamin E 400 UNIT capsule Take 400 Units by mouth daily.     No current facility-administered medications for this visit.    Allergies as of 01/25/2020  . (No Known Allergies)    Family History  Problem Relation Age of Onset  . Hypertension Father        and kidney failure  . Heart disease Father   . Heart attack Father   . Kidney disease Father   . Heart attack Maternal Grandfather   . Heart attack Paternal Grandfather   . Colon cancer Paternal Aunt 46       s/p surgical rsxn; lived to 74 yrs old  . Gastric cancer Neg Hx   . Esophageal cancer Neg Hx     Social History   Socioeconomic History  . Marital status: Single    Spouse name: Not on file  . Number of children: Not on file  . Years of education: Not on file  . Highest education level: Not on file  Occupational History  . Not on file  Tobacco Use  . Smoking status: Never Smoker  . Smokeless tobacco: Never Used  Substance and Sexual Activity  . Alcohol use: Yes    Alcohol/week: 0.0 standard drinks    Comment: rare  . Drug use: No  . Sexual activity: Not on file  Other Topics Concern  . Not on file  Social History Narrative  . Not on file   Social Determinants of Health   Financial Resource Strain:   . Difficulty of Paying Living Expenses:   Food Insecurity:   . Worried About Charity fundraiser in the Last Year:   . Arboriculturist in the Last Year:   Transportation Needs:   . Film/video editor (Medical):   Marland Kitchen Lack of Transportation (Non-Medical):   Physical Activity:   . Days of Exercise per Week:   . Minutes of Exercise per Session:   Stress:   . Feeling of Stress :   Social  Connections:   . Frequency of Communication with Friends and Family:   . Frequency of Social Gatherings with Friends and Family:   . Attends Religious Services:   . Active Member of Clubs or Organizations:   . Attends Archivist Meetings:   Marland Kitchen Marital Status:   Intimate Partner Violence:   . Fear of  Current or Ex-Partner:   . Emotionally Abused:   Marland Kitchen Physically Abused:   . Sexually Abused:     Subjective: Review of Systems  Constitutional: Negative for chills, fever, malaise/fatigue and weight loss.  HENT: Negative for congestion and sore throat.   Respiratory: Negative for cough and shortness of breath.   Cardiovascular: Negative for chest pain and palpitations.  Gastrointestinal: Negative for abdominal pain, blood in stool, diarrhea, melena, nausea and vomiting.  Musculoskeletal: Negative for joint pain and myalgias.  Skin: Negative for rash.  Neurological: Negative for dizziness and weakness.  Endo/Heme/Allergies: Does not bruise/bleed easily.  Psychiatric/Behavioral: Negative for depression. The patient is not nervous/anxious.   All other systems reviewed and are negative.      Objective: BP 111/64   Pulse (!) 56   Temp (!) 97.3 F (36.3 C) (Temporal)   Ht 5\' 7"  (1.702 m)   Wt 236 lb 3.2 oz (107.1 kg)   BMI 36.99 kg/m  Physical Exam Vitals and nursing note reviewed.  Constitutional:      General: He is not in acute distress.    Appearance: Normal appearance. He is not ill-appearing, toxic-appearing or diaphoretic.  HENT:     Head: Normocephalic and atraumatic.     Nose: No congestion or rhinorrhea.  Eyes:     General: No scleral icterus. Cardiovascular:     Rate and Rhythm: Normal rate and regular rhythm.     Heart sounds: Normal heart sounds.  Pulmonary:     Effort: Pulmonary effort is normal.     Breath sounds: Normal breath sounds.  Abdominal:     General: Bowel sounds are normal. There is no distension.     Palpations: Abdomen is soft. There  is no hepatomegaly, splenomegaly or mass.     Tenderness: There is no abdominal tenderness. There is no guarding or rebound.     Hernia: No hernia is present.  Musculoskeletal:     Cervical back: Neck supple.  Skin:    General: Skin is warm and dry.     Coloration: Skin is not jaundiced.     Findings: No bruising or rash.  Neurological:     General: No focal deficit present.     Mental Status: He is alert and oriented to person, place, and time. Mental status is at baseline.  Psychiatric:        Mood and Affect: Mood normal.        Behavior: Behavior normal.        Thought Content: Thought content normal.       01/25/2020 3:37 PM   Disclaimer: This note was dictated with voice recognition software. Similar sounding words can inadvertently be transcribed and may not be corrected upon review.

## 2020-01-26 ENCOUNTER — Telehealth: Payer: Self-pay | Admitting: *Deleted

## 2020-01-26 ENCOUNTER — Ambulatory Visit: Payer: Medicare Other | Admitting: Nurse Practitioner

## 2020-01-26 NOTE — Telephone Encounter (Signed)
Called patient. Made aware pre-op appt scheduled for 4/9 at 10:00am, covid test scheduled for 4:30pm. Aware of locations of testing. Patient also provided with carolyn # in endo as he states this conflicts with another appt.

## 2020-01-27 ENCOUNTER — Encounter (HOSPITAL_COMMUNITY): Payer: Self-pay

## 2020-01-27 ENCOUNTER — Encounter (HOSPITAL_COMMUNITY): Admission: RE | Admit: 2020-01-27 | Payer: Medicare Other | Source: Ambulatory Visit

## 2020-01-27 ENCOUNTER — Other Ambulatory Visit: Payer: Self-pay

## 2020-01-27 ENCOUNTER — Other Ambulatory Visit (HOSPITAL_COMMUNITY)
Admission: RE | Admit: 2020-01-27 | Discharge: 2020-01-27 | Disposition: A | Discharge status: Home / Self Care | Payer: Medicare Other | Source: Ambulatory Visit | Attending: Gastroenterology | Admitting: Gastroenterology

## 2020-01-27 ENCOUNTER — Encounter (HOSPITAL_COMMUNITY)
Admission: RE | Admit: 2020-01-27 | Discharge: 2020-01-27 | Disposition: A | Discharge status: Home / Self Care | Payer: Medicare Other | Source: Ambulatory Visit | Attending: Gastroenterology | Admitting: Gastroenterology

## 2020-01-27 DIAGNOSIS — E1122 Type 2 diabetes mellitus with diabetic chronic kidney disease: Secondary | ICD-10-CM | POA: Diagnosis not present

## 2020-01-27 DIAGNOSIS — Z20822 Contact with and (suspected) exposure to covid-19: Secondary | ICD-10-CM | POA: Diagnosis not present

## 2020-01-27 DIAGNOSIS — Z01812 Encounter for preprocedural laboratory examination: Secondary | ICD-10-CM | POA: Insufficient documentation

## 2020-01-27 DIAGNOSIS — L97511 Non-pressure chronic ulcer of other part of right foot limited to breakdown of skin: Secondary | ICD-10-CM | POA: Diagnosis not present

## 2020-01-27 DIAGNOSIS — E1151 Type 2 diabetes mellitus with diabetic peripheral angiopathy without gangrene: Secondary | ICD-10-CM | POA: Diagnosis not present

## 2020-01-27 DIAGNOSIS — N1832 Chronic kidney disease, stage 3b: Secondary | ICD-10-CM | POA: Diagnosis not present

## 2020-01-27 DIAGNOSIS — E11621 Type 2 diabetes mellitus with foot ulcer: Secondary | ICD-10-CM | POA: Diagnosis not present

## 2020-01-27 DIAGNOSIS — I129 Hypertensive chronic kidney disease with stage 1 through stage 4 chronic kidney disease, or unspecified chronic kidney disease: Secondary | ICD-10-CM | POA: Diagnosis not present

## 2020-01-27 HISTORY — DX: Anxiety disorder, unspecified: F41.9

## 2020-01-27 HISTORY — DX: Anemia, unspecified: D64.9

## 2020-01-27 HISTORY — DX: Polyneuropathy, unspecified: G62.9

## 2020-01-27 LAB — CBC WITH DIFFERENTIAL/PLATELET
Abs Immature Granulocytes: 0.02 10*3/uL (ref 0.00–0.07)
Basophils Absolute: 0.1 10*3/uL (ref 0.0–0.1)
Basophils Relative: 1 %
Eosinophils Absolute: 0.1 10*3/uL (ref 0.0–0.5)
Eosinophils Relative: 1 %
HCT: 38.2 % — ABNORMAL LOW (ref 39.0–52.0)
Hemoglobin: 11.7 g/dL — ABNORMAL LOW (ref 13.0–17.0)
Immature Granulocytes: 0 %
Lymphocytes Relative: 24 %
Lymphs Abs: 1.5 10*3/uL (ref 0.7–4.0)
MCH: 24.6 pg — ABNORMAL LOW (ref 26.0–34.0)
MCHC: 30.6 g/dL (ref 30.0–36.0)
MCV: 80.3 fL (ref 80.0–100.0)
Monocytes Absolute: 0.7 10*3/uL (ref 0.1–1.0)
Monocytes Relative: 11 %
Neutro Abs: 3.8 10*3/uL (ref 1.7–7.7)
Neutrophils Relative %: 63 %
Platelets: 256 10*3/uL (ref 150–400)
RBC: 4.76 MIL/uL (ref 4.22–5.81)
RDW: 26.5 % — ABNORMAL HIGH (ref 11.5–15.5)
WBC: 6.2 10*3/uL (ref 4.0–10.5)
nRBC: 0 % (ref 0.0–0.2)

## 2020-01-27 LAB — BASIC METABOLIC PANEL
Anion gap: 13 (ref 5–15)
BUN: 42 mg/dL — ABNORMAL HIGH (ref 8–23)
CO2: 22 mmol/L (ref 22–32)
Calcium: 9.1 mg/dL (ref 8.9–10.3)
Chloride: 98 mmol/L (ref 98–111)
Creatinine, Ser: 3.28 mg/dL — ABNORMAL HIGH (ref 0.61–1.24)
GFR calc Af Amer: 20 mL/min — ABNORMAL LOW (ref >60)
GFR calc non Af Amer: 18 mL/min — ABNORMAL LOW (ref >60)
Glucose, Bld: 80 mg/dL (ref 70–99)
Potassium: 4.2 mmol/L (ref 3.5–5.1)
Sodium: 133 mmol/L — ABNORMAL LOW (ref 135–145)

## 2020-01-27 NOTE — Patient Instructions (Signed)
Maclin Guerrette  01/27/2020     @PREFPERIOPPHARMACY @   Your procedure is scheduled on  01/31/2020 .  Report to [facility] at  [place].  Call this number if you have problems the morning of surgery:  [phone]   Remember:  Follow the diet and prep instructions given to you by Dr Nona Dell office.                     Take these medicines the morning of surgery with A SIP OF WATER  Celexa, gabapentin, losartan, metoprolol.    Do not wear jewelry, make-up or nail polish.  Do not wear lotions, powders, or perfumes. Please wear deodorant and brush your teeth.  Do not shave 48 hours prior to surgery.  Men may shave face and neck.  Do not bring valuables to the hospital.  [facility] is not responsible for any belongings or valuables.  Contacts, dentures or bridgework may not be worn into surgery.  Leave your suitcase in the car.  After surgery it may be brought to your room.  For patients admitted to the hospital, discharge time will be determined by your treatment team.  Patients discharged the day of surgery will not be allowed to drive home.   Name and phone number of your driver:   family Special instructions:  DO NOT smoke the morning of your procedure.  Please read over the following fact sheets that you were given. Anesthesia Post-op Instructions and Care and Recovery After Surgery       Upper Endoscopy, Adult, Care After This sheet gives you information about how to care for yourself after your procedure. Your health care provider may also give you more specific instructions. If you have problems or questions, contact your health care provider. What can I expect after the procedure? After the procedure, it is common to have:  A sore throat.  Mild stomach pain or discomfort.  Bloating.  Nausea. Follow these instructions at home:   Follow instructions from your health care provider about what to eat or drink after your procedure.  Return to your  normal activities as told by your health care provider. Ask your health care provider what activities are safe for you.  Take over-the-counter and prescription medicines only as told by your health care provider.  Do not drive for 24 hours if you were given a sedative during your procedure.  Keep all follow-up visits as told by your health care provider. This is important. Contact a health care provider if you have:  A sore throat that lasts longer than one day.  Trouble swallowing. Get help right away if:  You vomit blood or your vomit looks like coffee grounds.  You have: ? A fever. ? Bloody, black, or tarry stools. ? A severe sore throat or you cannot swallow. ? Difficulty breathing. ? Severe pain in your chest or abdomen. Summary  After the procedure, it is common to have a sore throat, mild stomach discomfort, bloating, and nausea.  Do not drive for 24 hours if you were given a sedative during the procedure.  Follow instructions from your health care provider about what to eat or drink after your procedure.  Return to your normal activities as told by your health care provider. This information is not intended to replace advice given to you by your health care provider. Make sure you discuss any questions you have with your health care provider.  Document Revised: 03/30/2018 Document Reviewed: 03/08/2018 Elsevier Patient Education  [place].  Colonoscopy, Adult, Care After This sheet gives you information about how to care for yourself after your procedure. Your health care provider may also give you more specific instructions. If you have problems or questions, contact your health care provider. What can I expect after the procedure? After the procedure, it is common to have:  A small amount of blood in your stool for 24 hours after the procedure.  Some gas.  Mild cramping or bloating of your abdomen. Follow these instructions at home: Eating and  drinking   Drink enough fluid to keep your urine pale yellow.  Follow instructions from your health care provider about eating or drinking restrictions.  Resume your normal diet as instructed by your health care provider. Avoid heavy or fried foods that are hard to digest. Activity  Rest as told by your health care provider.  Avoid sitting for a long time without moving. Get up to take short walks every 1-2 hours. This is important to improve blood flow and breathing. Ask for help if you feel weak or unsteady.  Return to your normal activities as told by your health care provider. Ask your health care provider what activities are safe for you. Managing cramping and bloating   Try walking around when you have cramps or feel bloated.  Apply heat to your abdomen as told by your health care provider. Use the heat source that your health care provider recommends, such as a moist heat pack or a heating pad. ? Place a towel between your skin and the heat source. ? Leave the heat on for 20-30 minutes. ? Remove the heat if your skin turns bright red. This is especially important if you are unable to feel pain, heat, or cold. You may have a greater risk of getting burned. General instructions  For the first 24 hours after the procedure: ? Do not drive or use machinery. ? Do not sign important documents. ? Do not drink alcohol. ? Do your regular daily activities at a slower pace than normal. ? Eat soft foods that are easy to digest.  Take over-the-counter and prescription medicines only as told by your health care provider.  Keep all follow-up visits as told by your health care provider. This is important. Contact a health care provider if:  You have blood in your stool 2-3 days after the procedure. Get help right away if you have:  More than a small spotting of blood in your stool.  Large blood clots in your stool.  Swelling of your abdomen.  Nausea or vomiting.  A  fever.  Increasing pain in your abdomen that is not relieved with medicine. Summary  After the procedure, it is common to have a small amount of blood in your stool. You may also have mild cramping and bloating of your abdomen.  For the first 24 hours after the procedure, do not drive or use machinery, sign important documents, or drink alcohol.  Get help right away if you have a lot of blood in your stool, nausea or vomiting, a fever, or increased pain in your abdomen. This information is not intended to replace advice given to you by your health care provider. Make sure you discuss any questions you have with your health care provider. Document Revised: 05/02/2019 Document Reviewed: 05/02/2019 Elsevier Patient Education  [place] After These instructions provide you with information about caring for  yourself after your procedure. Your health care provider may also give you more specific instructions. Your treatment has been planned according to current medical practices, but problems sometimes occur. Call your health care provider if you have any problems or questions after your procedure. What can I expect after the procedure? After your procedure, you may:  Feel sleepy for several hours.  Feel clumsy and have poor balance for several hours.  Feel forgetful about what happened after the procedure.  Have poor judgment for several hours.  Feel nauseous or vomit.  Have a sore throat if you had a breathing tube during the procedure. Follow these instructions at home: For at least 24 hours after the procedure:      Have a responsible adult stay with you. It is important to have someone help care for you until you are awake and alert.  Rest as needed.  Do not: ? Participate in activities in which you could fall or become injured. ? Drive. ? Use heavy machinery. ? Drink alcohol. ? Take sleeping pills or medicines that cause  drowsiness. ? Make important decisions or sign legal documents. ? Take care of children on your own. Eating and drinking  Follow the diet that is recommended by your health care provider.  If you vomit, drink water, juice, or soup when you can drink without vomiting.  Make sure you have little or no nausea before eating solid foods. General instructions  Take over-the-counter and prescription medicines only as told by your health care provider.  If you have sleep apnea, surgery and certain medicines can increase your risk for breathing problems. Follow instructions from your health care provider about wearing your sleep device: ? Anytime you are sleeping, including during daytime naps. ? While taking prescription pain medicines, sleeping medicines, or medicines that make you drowsy.  If you smoke, do not smoke without supervision.  Keep all follow-up visits as told by your health care provider. This is important. Contact a health care provider if:  You keep feeling nauseous or you keep vomiting.  You feel light-headed.  You develop a rash.  You have a fever. Get help right away if:  You have trouble breathing. Summary  For several hours after your procedure, you may feel sleepy and have poor judgment.  Have a responsible adult stay with you for at least 24 hours or until you are awake and alert. This information is not intended to replace advice given to you by your health care provider. Make sure you discuss any questions you have with your health care provider. Document Revised: 01/04/2018 Document Reviewed: 01/27/2016 Elsevier Patient Education  [place].

## 2020-01-28 LAB — SARS CORONAVIRUS 2 (TAT 6-24 HRS): SARS Coronavirus 2: NEGATIVE

## 2020-01-29 ENCOUNTER — Telehealth: Payer: Self-pay | Admitting: Gastroenterology

## 2020-01-29 NOTE — Telephone Encounter (Signed)
PLEASE CALL PT. HIS KIDNEY FUNCTION IS SIGNIFICANTLY REDUCED. HE SHOULD SEE DR. Jackson [place] ASAP TO DISCUSS THIS. HE SHOULD HOLD HIS LASIX FOR 3 DAYS. DO NOT USE IBUPROFEN ANYMORE. STOP THE METFORMIN.

## 2020-01-30 ENCOUNTER — Telehealth: Payer: Self-pay | Admitting: Gastroenterology

## 2020-01-30 ENCOUNTER — Encounter (HOSPITAL_COMMUNITY): Payer: Self-pay | Admitting: Anesthesiology

## 2020-01-30 NOTE — Telephone Encounter (Signed)
Called pt unable to leave vm.

## 2020-01-30 NOTE — Telephone Encounter (Signed)
Pt called asking for SF to call him. [phone]

## 2020-01-30 NOTE — Telephone Encounter (Signed)
CC'ED TO PCP 

## 2020-01-30 NOTE — Pre-Procedure Instructions (Signed)
Reviewed labs and chart with Dr Charna Elizabeth. Interoffice mailed Dr Oneida Alar due to his concern over renal function. He feels patient needs to see PCP before proceeding with tomorrows scheduled procedure. Sent note to Dr Oneida Alar about this.

## 2020-01-30 NOTE — Progress Notes (Signed)
CC'ED TO PCP 

## 2020-01-30 NOTE — Telephone Encounter (Signed)
Spoke with patient. He is aware of results below and he voiced understanding. Reports he see's Dr. Marylene Buerger and request I send the results to him. I have sent results. He has been out of lasix x 2 days. He will stop ibuprofen and metformin. He has already started prepping for his TCS.  Also endo called and wants to know if patient is too still to have procedure tomorrow?

## 2020-01-30 NOTE — Telephone Encounter (Signed)
Per [facility] ask anesthesia.  Called endo and spoke with Pamala Hurry. She states Dr. [place] states patient needs to see PCP before procedure can be done.   Called pt. Aware procedure cancelled for tomorrow. He is too f/u with either PCP or Dr. Theador Hawthorne ASAP. Results have been sent to both providers. He is going to call them today.  Called carolyn in endo and LMOVM to take patient off schedule

## 2020-01-31 ENCOUNTER — Encounter (HOSPITAL_COMMUNITY): Admission: RE | Payer: Self-pay | Source: Home / Self Care

## 2020-01-31 ENCOUNTER — Ambulatory Visit (HOSPITAL_COMMUNITY): Admission: RE | Admit: 2020-01-31 | Payer: Medicare Other | Source: Home / Self Care | Admitting: Gastroenterology

## 2020-01-31 SURGERY — COLONOSCOPY WITH PROPOFOL
Anesthesia: Monitor Anesthesia Care

## 2020-01-31 NOTE — Telephone Encounter (Signed)
noted 

## 2020-01-31 NOTE — Telephone Encounter (Signed)
REVIEWED-NO ADDITIONAL RECOMMENDATIONS. 

## 2020-02-01 DIAGNOSIS — I10 Essential (primary) hypertension: Secondary | ICD-10-CM | POA: Diagnosis not present

## 2020-02-01 DIAGNOSIS — Z299 Encounter for prophylactic measures, unspecified: Secondary | ICD-10-CM | POA: Diagnosis not present

## 2020-02-01 DIAGNOSIS — N184 Chronic kidney disease, stage 4 (severe): Secondary | ICD-10-CM | POA: Diagnosis not present

## 2020-02-01 DIAGNOSIS — E1122 Type 2 diabetes mellitus with diabetic chronic kidney disease: Secondary | ICD-10-CM | POA: Diagnosis not present

## 2020-02-01 DIAGNOSIS — E1165 Type 2 diabetes mellitus with hyperglycemia: Secondary | ICD-10-CM | POA: Diagnosis not present

## 2020-02-03 DIAGNOSIS — L97511 Non-pressure chronic ulcer of other part of right foot limited to breakdown of skin: Secondary | ICD-10-CM | POA: Diagnosis not present

## 2020-02-03 DIAGNOSIS — E11621 Type 2 diabetes mellitus with foot ulcer: Secondary | ICD-10-CM | POA: Diagnosis not present

## 2020-02-03 DIAGNOSIS — N1832 Chronic kidney disease, stage 3b: Secondary | ICD-10-CM | POA: Diagnosis not present

## 2020-02-03 DIAGNOSIS — E1151 Type 2 diabetes mellitus with diabetic peripheral angiopathy without gangrene: Secondary | ICD-10-CM | POA: Diagnosis not present

## 2020-02-03 DIAGNOSIS — E1122 Type 2 diabetes mellitus with diabetic chronic kidney disease: Secondary | ICD-10-CM | POA: Diagnosis not present

## 2020-02-03 DIAGNOSIS — I129 Hypertensive chronic kidney disease with stage 1 through stage 4 chronic kidney disease, or unspecified chronic kidney disease: Secondary | ICD-10-CM | POA: Diagnosis not present

## 2020-02-06 DIAGNOSIS — Z89512 Acquired absence of left leg below knee: Secondary | ICD-10-CM | POA: Diagnosis not present

## 2020-02-06 DIAGNOSIS — Z7984 Long term (current) use of oral hypoglycemic drugs: Secondary | ICD-10-CM | POA: Diagnosis not present

## 2020-02-06 DIAGNOSIS — E1122 Type 2 diabetes mellitus with diabetic chronic kidney disease: Secondary | ICD-10-CM | POA: Diagnosis not present

## 2020-02-06 DIAGNOSIS — N1832 Chronic kidney disease, stage 3b: Secondary | ICD-10-CM | POA: Diagnosis not present

## 2020-02-06 DIAGNOSIS — E7849 Other hyperlipidemia: Secondary | ICD-10-CM | POA: Diagnosis not present

## 2020-02-06 DIAGNOSIS — E1129 Type 2 diabetes mellitus with other diabetic kidney complication: Secondary | ICD-10-CM | POA: Diagnosis not present

## 2020-02-06 DIAGNOSIS — E11621 Type 2 diabetes mellitus with foot ulcer: Secondary | ICD-10-CM | POA: Diagnosis not present

## 2020-02-06 DIAGNOSIS — E1151 Type 2 diabetes mellitus with diabetic peripheral angiopathy without gangrene: Secondary | ICD-10-CM | POA: Diagnosis not present

## 2020-02-06 DIAGNOSIS — L97511 Non-pressure chronic ulcer of other part of right foot limited to breakdown of skin: Secondary | ICD-10-CM | POA: Diagnosis not present

## 2020-02-06 DIAGNOSIS — D631 Anemia in chronic kidney disease: Secondary | ICD-10-CM | POA: Diagnosis not present

## 2020-02-06 DIAGNOSIS — F419 Anxiety disorder, unspecified: Secondary | ICD-10-CM | POA: Diagnosis not present

## 2020-02-06 DIAGNOSIS — E6609 Other obesity due to excess calories: Secondary | ICD-10-CM | POA: Diagnosis not present

## 2020-02-06 DIAGNOSIS — I129 Hypertensive chronic kidney disease with stage 1 through stage 4 chronic kidney disease, or unspecified chronic kidney disease: Secondary | ICD-10-CM | POA: Diagnosis not present

## 2020-02-06 DIAGNOSIS — N2581 Secondary hyperparathyroidism of renal origin: Secondary | ICD-10-CM | POA: Diagnosis not present

## 2020-02-09 NOTE — Telephone Encounter (Signed)
Called patient TO DISCUSS CONCERNS. LVM-CALL [phone] TO DISCUSS.  

## 2020-02-10 DIAGNOSIS — Z4800 Encounter for change or removal of nonsurgical wound dressing: Secondary | ICD-10-CM | POA: Diagnosis not present

## 2020-02-10 DIAGNOSIS — M86671 Other chronic osteomyelitis, right ankle and foot: Secondary | ICD-10-CM | POA: Diagnosis not present

## 2020-02-10 DIAGNOSIS — L97518 Non-pressure chronic ulcer of other part of right foot with other specified severity: Secondary | ICD-10-CM | POA: Diagnosis not present

## 2020-02-10 DIAGNOSIS — E1169 Type 2 diabetes mellitus with other specified complication: Secondary | ICD-10-CM | POA: Diagnosis not present

## 2020-02-10 DIAGNOSIS — E11621 Type 2 diabetes mellitus with foot ulcer: Secondary | ICD-10-CM | POA: Diagnosis not present

## 2020-02-14 DIAGNOSIS — E11621 Type 2 diabetes mellitus with foot ulcer: Secondary | ICD-10-CM | POA: Diagnosis not present

## 2020-02-14 DIAGNOSIS — L97511 Non-pressure chronic ulcer of other part of right foot limited to breakdown of skin: Secondary | ICD-10-CM | POA: Diagnosis not present

## 2020-02-14 DIAGNOSIS — N1832 Chronic kidney disease, stage 3b: Secondary | ICD-10-CM | POA: Diagnosis not present

## 2020-02-14 DIAGNOSIS — E1122 Type 2 diabetes mellitus with diabetic chronic kidney disease: Secondary | ICD-10-CM | POA: Diagnosis not present

## 2020-02-14 DIAGNOSIS — E1151 Type 2 diabetes mellitus with diabetic peripheral angiopathy without gangrene: Secondary | ICD-10-CM | POA: Diagnosis not present

## 2020-02-14 DIAGNOSIS — I129 Hypertensive chronic kidney disease with stage 1 through stage 4 chronic kidney disease, or unspecified chronic kidney disease: Secondary | ICD-10-CM | POA: Diagnosis not present

## 2020-02-16 DIAGNOSIS — E1122 Type 2 diabetes mellitus with diabetic chronic kidney disease: Secondary | ICD-10-CM | POA: Diagnosis not present

## 2020-02-16 DIAGNOSIS — I129 Hypertensive chronic kidney disease with stage 1 through stage 4 chronic kidney disease, or unspecified chronic kidney disease: Secondary | ICD-10-CM | POA: Diagnosis not present

## 2020-02-16 DIAGNOSIS — L97511 Non-pressure chronic ulcer of other part of right foot limited to breakdown of skin: Secondary | ICD-10-CM | POA: Diagnosis not present

## 2020-02-16 DIAGNOSIS — N1832 Chronic kidney disease, stage 3b: Secondary | ICD-10-CM | POA: Diagnosis not present

## 2020-02-16 DIAGNOSIS — E1151 Type 2 diabetes mellitus with diabetic peripheral angiopathy without gangrene: Secondary | ICD-10-CM | POA: Diagnosis not present

## 2020-02-16 DIAGNOSIS — E11621 Type 2 diabetes mellitus with foot ulcer: Secondary | ICD-10-CM | POA: Diagnosis not present

## 2020-02-17 DIAGNOSIS — E11621 Type 2 diabetes mellitus with foot ulcer: Secondary | ICD-10-CM | POA: Diagnosis not present

## 2020-02-17 DIAGNOSIS — L97518 Non-pressure chronic ulcer of other part of right foot with other specified severity: Secondary | ICD-10-CM | POA: Diagnosis not present

## 2020-02-17 DIAGNOSIS — I129 Hypertensive chronic kidney disease with stage 1 through stage 4 chronic kidney disease, or unspecified chronic kidney disease: Secondary | ICD-10-CM | POA: Diagnosis not present

## 2020-02-17 DIAGNOSIS — D638 Anemia in other chronic diseases classified elsewhere: Secondary | ICD-10-CM | POA: Diagnosis not present

## 2020-02-17 DIAGNOSIS — I739 Peripheral vascular disease, unspecified: Secondary | ICD-10-CM | POA: Diagnosis not present

## 2020-02-17 DIAGNOSIS — E119 Type 2 diabetes mellitus without complications: Secondary | ICD-10-CM | POA: Diagnosis not present

## 2020-02-17 DIAGNOSIS — N189 Chronic kidney disease, unspecified: Secondary | ICD-10-CM | POA: Diagnosis not present

## 2020-02-17 DIAGNOSIS — E1122 Type 2 diabetes mellitus with diabetic chronic kidney disease: Secondary | ICD-10-CM | POA: Diagnosis not present

## 2020-02-17 DIAGNOSIS — E211 Secondary hyperparathyroidism, not elsewhere classified: Secondary | ICD-10-CM | POA: Diagnosis not present

## 2020-02-17 DIAGNOSIS — N200 Calculus of kidney: Secondary | ICD-10-CM | POA: Diagnosis not present

## 2020-02-17 DIAGNOSIS — E1169 Type 2 diabetes mellitus with other specified complication: Secondary | ICD-10-CM | POA: Diagnosis not present

## 2020-02-17 DIAGNOSIS — D472 Monoclonal gammopathy: Secondary | ICD-10-CM | POA: Diagnosis not present

## 2020-02-17 DIAGNOSIS — M86671 Other chronic osteomyelitis, right ankle and foot: Secondary | ICD-10-CM | POA: Diagnosis not present

## 2020-02-17 DIAGNOSIS — Z4800 Encounter for change or removal of nonsurgical wound dressing: Secondary | ICD-10-CM | POA: Diagnosis not present

## 2020-02-17 DIAGNOSIS — E1129 Type 2 diabetes mellitus with other diabetic kidney complication: Secondary | ICD-10-CM | POA: Diagnosis not present

## 2020-02-17 DIAGNOSIS — R809 Proteinuria, unspecified: Secondary | ICD-10-CM | POA: Diagnosis not present

## 2020-02-21 DIAGNOSIS — E1122 Type 2 diabetes mellitus with diabetic chronic kidney disease: Secondary | ICD-10-CM | POA: Diagnosis not present

## 2020-02-21 DIAGNOSIS — E11621 Type 2 diabetes mellitus with foot ulcer: Secondary | ICD-10-CM | POA: Diagnosis not present

## 2020-02-21 DIAGNOSIS — N1832 Chronic kidney disease, stage 3b: Secondary | ICD-10-CM | POA: Diagnosis not present

## 2020-02-21 DIAGNOSIS — L97511 Non-pressure chronic ulcer of other part of right foot limited to breakdown of skin: Secondary | ICD-10-CM | POA: Diagnosis not present

## 2020-02-21 DIAGNOSIS — I129 Hypertensive chronic kidney disease with stage 1 through stage 4 chronic kidney disease, or unspecified chronic kidney disease: Secondary | ICD-10-CM | POA: Diagnosis not present

## 2020-02-21 DIAGNOSIS — E1151 Type 2 diabetes mellitus with diabetic peripheral angiopathy without gangrene: Secondary | ICD-10-CM | POA: Diagnosis not present

## 2020-02-23 DIAGNOSIS — L97511 Non-pressure chronic ulcer of other part of right foot limited to breakdown of skin: Secondary | ICD-10-CM | POA: Diagnosis not present

## 2020-02-23 DIAGNOSIS — E1122 Type 2 diabetes mellitus with diabetic chronic kidney disease: Secondary | ICD-10-CM | POA: Diagnosis not present

## 2020-02-23 DIAGNOSIS — E1151 Type 2 diabetes mellitus with diabetic peripheral angiopathy without gangrene: Secondary | ICD-10-CM | POA: Diagnosis not present

## 2020-02-23 DIAGNOSIS — I129 Hypertensive chronic kidney disease with stage 1 through stage 4 chronic kidney disease, or unspecified chronic kidney disease: Secondary | ICD-10-CM | POA: Diagnosis not present

## 2020-02-23 DIAGNOSIS — E11621 Type 2 diabetes mellitus with foot ulcer: Secondary | ICD-10-CM | POA: Diagnosis not present

## 2020-02-23 DIAGNOSIS — N1832 Chronic kidney disease, stage 3b: Secondary | ICD-10-CM | POA: Diagnosis not present

## 2020-02-24 ENCOUNTER — Other Ambulatory Visit (HOSPITAL_COMMUNITY): Payer: Self-pay | Admitting: Nephrology

## 2020-02-24 ENCOUNTER — Other Ambulatory Visit: Payer: Self-pay | Admitting: Nephrology

## 2020-02-24 DIAGNOSIS — I10 Essential (primary) hypertension: Secondary | ICD-10-CM

## 2020-02-24 DIAGNOSIS — E11621 Type 2 diabetes mellitus with foot ulcer: Secondary | ICD-10-CM | POA: Diagnosis not present

## 2020-02-24 DIAGNOSIS — D472 Monoclonal gammopathy: Secondary | ICD-10-CM | POA: Diagnosis not present

## 2020-02-24 DIAGNOSIS — E1129 Type 2 diabetes mellitus with other diabetic kidney complication: Secondary | ICD-10-CM | POA: Diagnosis not present

## 2020-02-24 DIAGNOSIS — E1122 Type 2 diabetes mellitus with diabetic chronic kidney disease: Secondary | ICD-10-CM

## 2020-02-24 DIAGNOSIS — M869 Osteomyelitis, unspecified: Secondary | ICD-10-CM | POA: Diagnosis not present

## 2020-02-24 DIAGNOSIS — M86171 Other acute osteomyelitis, right ankle and foot: Secondary | ICD-10-CM | POA: Diagnosis not present

## 2020-02-24 DIAGNOSIS — I129 Hypertensive chronic kidney disease with stage 1 through stage 4 chronic kidney disease, or unspecified chronic kidney disease: Secondary | ICD-10-CM | POA: Diagnosis not present

## 2020-02-24 DIAGNOSIS — R809 Proteinuria, unspecified: Secondary | ICD-10-CM | POA: Diagnosis not present

## 2020-02-24 DIAGNOSIS — E211 Secondary hyperparathyroidism, not elsewhere classified: Secondary | ICD-10-CM | POA: Diagnosis not present

## 2020-02-24 DIAGNOSIS — D631 Anemia in chronic kidney disease: Secondary | ICD-10-CM

## 2020-02-24 DIAGNOSIS — D638 Anemia in other chronic diseases classified elsewhere: Secondary | ICD-10-CM

## 2020-02-24 DIAGNOSIS — N2581 Secondary hyperparathyroidism of renal origin: Secondary | ICD-10-CM

## 2020-02-24 DIAGNOSIS — L97513 Non-pressure chronic ulcer of other part of right foot with necrosis of muscle: Secondary | ICD-10-CM | POA: Diagnosis not present

## 2020-02-24 DIAGNOSIS — L97518 Non-pressure chronic ulcer of other part of right foot with other specified severity: Secondary | ICD-10-CM | POA: Diagnosis not present

## 2020-02-24 DIAGNOSIS — N189 Chronic kidney disease, unspecified: Secondary | ICD-10-CM | POA: Diagnosis not present

## 2020-03-02 DIAGNOSIS — E1122 Type 2 diabetes mellitus with diabetic chronic kidney disease: Secondary | ICD-10-CM | POA: Diagnosis not present

## 2020-03-02 DIAGNOSIS — E1165 Type 2 diabetes mellitus with hyperglycemia: Secondary | ICD-10-CM | POA: Diagnosis not present

## 2020-03-02 DIAGNOSIS — E11621 Type 2 diabetes mellitus with foot ulcer: Secondary | ICD-10-CM | POA: Diagnosis not present

## 2020-03-02 DIAGNOSIS — Z299 Encounter for prophylactic measures, unspecified: Secondary | ICD-10-CM | POA: Diagnosis not present

## 2020-03-02 DIAGNOSIS — I10 Essential (primary) hypertension: Secondary | ICD-10-CM | POA: Diagnosis not present

## 2020-03-02 DIAGNOSIS — L97518 Non-pressure chronic ulcer of other part of right foot with other specified severity: Secondary | ICD-10-CM | POA: Diagnosis not present

## 2020-03-02 DIAGNOSIS — M86171 Other acute osteomyelitis, right ankle and foot: Secondary | ICD-10-CM | POA: Diagnosis not present

## 2020-03-02 DIAGNOSIS — L97513 Non-pressure chronic ulcer of other part of right foot with necrosis of muscle: Secondary | ICD-10-CM | POA: Diagnosis not present

## 2020-03-02 DIAGNOSIS — S88119A Complete traumatic amputation at level between knee and ankle, unspecified lower leg, initial encounter: Secondary | ICD-10-CM | POA: Diagnosis not present

## 2020-03-02 DIAGNOSIS — M869 Osteomyelitis, unspecified: Secondary | ICD-10-CM | POA: Diagnosis not present

## 2020-03-06 DIAGNOSIS — L97511 Non-pressure chronic ulcer of other part of right foot limited to breakdown of skin: Secondary | ICD-10-CM | POA: Diagnosis not present

## 2020-03-06 DIAGNOSIS — I129 Hypertensive chronic kidney disease with stage 1 through stage 4 chronic kidney disease, or unspecified chronic kidney disease: Secondary | ICD-10-CM | POA: Diagnosis not present

## 2020-03-06 DIAGNOSIS — N1832 Chronic kidney disease, stage 3b: Secondary | ICD-10-CM | POA: Diagnosis not present

## 2020-03-06 DIAGNOSIS — E1151 Type 2 diabetes mellitus with diabetic peripheral angiopathy without gangrene: Secondary | ICD-10-CM | POA: Diagnosis not present

## 2020-03-06 DIAGNOSIS — E11621 Type 2 diabetes mellitus with foot ulcer: Secondary | ICD-10-CM | POA: Diagnosis not present

## 2020-03-06 DIAGNOSIS — E1122 Type 2 diabetes mellitus with diabetic chronic kidney disease: Secondary | ICD-10-CM | POA: Diagnosis not present

## 2020-03-07 DIAGNOSIS — I129 Hypertensive chronic kidney disease with stage 1 through stage 4 chronic kidney disease, or unspecified chronic kidney disease: Secondary | ICD-10-CM | POA: Diagnosis not present

## 2020-03-07 DIAGNOSIS — E1151 Type 2 diabetes mellitus with diabetic peripheral angiopathy without gangrene: Secondary | ICD-10-CM | POA: Diagnosis not present

## 2020-03-07 DIAGNOSIS — Z7984 Long term (current) use of oral hypoglycemic drugs: Secondary | ICD-10-CM | POA: Diagnosis not present

## 2020-03-07 DIAGNOSIS — N1832 Chronic kidney disease, stage 3b: Secondary | ICD-10-CM | POA: Diagnosis not present

## 2020-03-07 DIAGNOSIS — F419 Anxiety disorder, unspecified: Secondary | ICD-10-CM | POA: Diagnosis not present

## 2020-03-07 DIAGNOSIS — E7849 Other hyperlipidemia: Secondary | ICD-10-CM | POA: Diagnosis not present

## 2020-03-07 DIAGNOSIS — E11621 Type 2 diabetes mellitus with foot ulcer: Secondary | ICD-10-CM | POA: Diagnosis not present

## 2020-03-07 DIAGNOSIS — E1122 Type 2 diabetes mellitus with diabetic chronic kidney disease: Secondary | ICD-10-CM | POA: Diagnosis not present

## 2020-03-07 DIAGNOSIS — L97511 Non-pressure chronic ulcer of other part of right foot limited to breakdown of skin: Secondary | ICD-10-CM | POA: Diagnosis not present

## 2020-03-07 DIAGNOSIS — D631 Anemia in chronic kidney disease: Secondary | ICD-10-CM | POA: Diagnosis not present

## 2020-03-07 DIAGNOSIS — N2581 Secondary hyperparathyroidism of renal origin: Secondary | ICD-10-CM | POA: Diagnosis not present

## 2020-03-07 DIAGNOSIS — Z89512 Acquired absence of left leg below knee: Secondary | ICD-10-CM | POA: Diagnosis not present

## 2020-03-07 DIAGNOSIS — E6609 Other obesity due to excess calories: Secondary | ICD-10-CM | POA: Diagnosis not present

## 2020-03-07 DIAGNOSIS — E1129 Type 2 diabetes mellitus with other diabetic kidney complication: Secondary | ICD-10-CM | POA: Diagnosis not present

## 2020-03-08 DIAGNOSIS — N1832 Chronic kidney disease, stage 3b: Secondary | ICD-10-CM | POA: Diagnosis not present

## 2020-03-08 DIAGNOSIS — I129 Hypertensive chronic kidney disease with stage 1 through stage 4 chronic kidney disease, or unspecified chronic kidney disease: Secondary | ICD-10-CM | POA: Diagnosis not present

## 2020-03-08 DIAGNOSIS — E11621 Type 2 diabetes mellitus with foot ulcer: Secondary | ICD-10-CM | POA: Diagnosis not present

## 2020-03-08 DIAGNOSIS — E1122 Type 2 diabetes mellitus with diabetic chronic kidney disease: Secondary | ICD-10-CM | POA: Diagnosis not present

## 2020-03-08 DIAGNOSIS — E1151 Type 2 diabetes mellitus with diabetic peripheral angiopathy without gangrene: Secondary | ICD-10-CM | POA: Diagnosis not present

## 2020-03-08 DIAGNOSIS — L97511 Non-pressure chronic ulcer of other part of right foot limited to breakdown of skin: Secondary | ICD-10-CM | POA: Diagnosis not present

## 2020-03-09 DIAGNOSIS — I129 Hypertensive chronic kidney disease with stage 1 through stage 4 chronic kidney disease, or unspecified chronic kidney disease: Secondary | ICD-10-CM | POA: Diagnosis not present

## 2020-03-09 DIAGNOSIS — M869 Osteomyelitis, unspecified: Secondary | ICD-10-CM | POA: Diagnosis not present

## 2020-03-09 DIAGNOSIS — E7800 Pure hypercholesterolemia, unspecified: Secondary | ICD-10-CM | POA: Diagnosis not present

## 2020-03-09 DIAGNOSIS — L97419 Non-pressure chronic ulcer of right heel and midfoot with unspecified severity: Secondary | ICD-10-CM | POA: Diagnosis not present

## 2020-03-09 DIAGNOSIS — Z79899 Other long term (current) drug therapy: Secondary | ICD-10-CM | POA: Diagnosis not present

## 2020-03-09 DIAGNOSIS — E1122 Type 2 diabetes mellitus with diabetic chronic kidney disease: Secondary | ICD-10-CM | POA: Diagnosis not present

## 2020-03-09 DIAGNOSIS — Z9049 Acquired absence of other specified parts of digestive tract: Secondary | ICD-10-CM | POA: Diagnosis not present

## 2020-03-09 DIAGNOSIS — N189 Chronic kidney disease, unspecified: Secondary | ICD-10-CM | POA: Diagnosis not present

## 2020-03-09 DIAGNOSIS — E1169 Type 2 diabetes mellitus with other specified complication: Secondary | ICD-10-CM | POA: Diagnosis not present

## 2020-03-09 DIAGNOSIS — E11621 Type 2 diabetes mellitus with foot ulcer: Secondary | ICD-10-CM | POA: Diagnosis not present

## 2020-03-09 DIAGNOSIS — Z89512 Acquired absence of left leg below knee: Secondary | ICD-10-CM | POA: Diagnosis not present

## 2020-03-09 DIAGNOSIS — L97412 Non-pressure chronic ulcer of right heel and midfoot with fat layer exposed: Secondary | ICD-10-CM | POA: Diagnosis not present

## 2020-03-09 DIAGNOSIS — F419 Anxiety disorder, unspecified: Secondary | ICD-10-CM | POA: Diagnosis not present

## 2020-03-09 DIAGNOSIS — Z7984 Long term (current) use of oral hypoglycemic drugs: Secondary | ICD-10-CM | POA: Diagnosis not present

## 2020-03-09 DIAGNOSIS — I4892 Unspecified atrial flutter: Secondary | ICD-10-CM | POA: Diagnosis not present

## 2020-03-12 ENCOUNTER — Encounter (HOSPITAL_COMMUNITY): Payer: Self-pay

## 2020-03-13 ENCOUNTER — Inpatient Hospital Stay (HOSPITAL_COMMUNITY): Payer: Medicare Other

## 2020-03-13 ENCOUNTER — Other Ambulatory Visit: Payer: Self-pay

## 2020-03-13 ENCOUNTER — Other Ambulatory Visit: Payer: Self-pay | Admitting: Radiology

## 2020-03-13 ENCOUNTER — Encounter (HOSPITAL_COMMUNITY): Payer: Self-pay | Admitting: Hematology

## 2020-03-13 ENCOUNTER — Inpatient Hospital Stay (HOSPITAL_COMMUNITY): Payer: Medicare Other | Attending: Hematology | Admitting: Hematology

## 2020-03-13 ENCOUNTER — Ambulatory Visit (HOSPITAL_COMMUNITY)
Admission: RE | Admit: 2020-03-13 | Discharge: 2020-03-13 | Disposition: A | Discharge status: Home / Self Care | Payer: Medicare Other | Source: Ambulatory Visit | Attending: Hematology | Admitting: Hematology

## 2020-03-13 DIAGNOSIS — N1832 Chronic kidney disease, stage 3b: Secondary | ICD-10-CM | POA: Diagnosis not present

## 2020-03-13 DIAGNOSIS — D649 Anemia, unspecified: Secondary | ICD-10-CM | POA: Insufficient documentation

## 2020-03-13 DIAGNOSIS — E785 Hyperlipidemia, unspecified: Secondary | ICD-10-CM | POA: Insufficient documentation

## 2020-03-13 DIAGNOSIS — I129 Hypertensive chronic kidney disease with stage 1 through stage 4 chronic kidney disease, or unspecified chronic kidney disease: Secondary | ICD-10-CM | POA: Diagnosis not present

## 2020-03-13 DIAGNOSIS — E1151 Type 2 diabetes mellitus with diabetic peripheral angiopathy without gangrene: Secondary | ICD-10-CM | POA: Diagnosis not present

## 2020-03-13 DIAGNOSIS — F419 Anxiety disorder, unspecified: Secondary | ICD-10-CM | POA: Insufficient documentation

## 2020-03-13 DIAGNOSIS — D472 Monoclonal gammopathy: Secondary | ICD-10-CM | POA: Insufficient documentation

## 2020-03-13 DIAGNOSIS — E119 Type 2 diabetes mellitus without complications: Secondary | ICD-10-CM | POA: Diagnosis not present

## 2020-03-13 DIAGNOSIS — Z79899 Other long term (current) drug therapy: Secondary | ICD-10-CM | POA: Insufficient documentation

## 2020-03-13 DIAGNOSIS — R634 Abnormal weight loss: Secondary | ICD-10-CM | POA: Insufficient documentation

## 2020-03-13 DIAGNOSIS — L97511 Non-pressure chronic ulcer of other part of right foot limited to breakdown of skin: Secondary | ICD-10-CM | POA: Diagnosis not present

## 2020-03-13 DIAGNOSIS — E11621 Type 2 diabetes mellitus with foot ulcer: Secondary | ICD-10-CM | POA: Diagnosis not present

## 2020-03-13 DIAGNOSIS — C61 Malignant neoplasm of prostate: Secondary | ICD-10-CM | POA: Insufficient documentation

## 2020-03-13 DIAGNOSIS — K5909 Other constipation: Secondary | ICD-10-CM | POA: Insufficient documentation

## 2020-03-13 DIAGNOSIS — G629 Polyneuropathy, unspecified: Secondary | ICD-10-CM | POA: Insufficient documentation

## 2020-03-13 DIAGNOSIS — E1122 Type 2 diabetes mellitus with diabetic chronic kidney disease: Secondary | ICD-10-CM | POA: Diagnosis not present

## 2020-03-13 LAB — CBC WITH DIFFERENTIAL/PLATELET
Abs Immature Granulocytes: 0.01 10*3/uL (ref 0.00–0.07)
Basophils Absolute: 0.1 10*3/uL (ref 0.0–0.1)
Basophils Relative: 1 %
Eosinophils Absolute: 0.4 10*3/uL (ref 0.0–0.5)
Eosinophils Relative: 4 %
HCT: 47.1 % (ref 39.0–52.0)
Hemoglobin: 15 g/dL (ref 13.0–17.0)
Immature Granulocytes: 0 %
Lymphocytes Relative: 22 %
Lymphs Abs: 1.9 10*3/uL (ref 0.7–4.0)
MCH: 27.6 pg (ref 26.0–34.0)
MCHC: 31.8 g/dL (ref 30.0–36.0)
MCV: 86.7 fL (ref 80.0–100.0)
Monocytes Absolute: 0.8 10*3/uL (ref 0.1–1.0)
Monocytes Relative: 9 %
Neutro Abs: 5.6 10*3/uL (ref 1.7–7.7)
Neutrophils Relative %: 64 %
Platelets: 260 10*3/uL (ref 150–400)
RBC: 5.43 MIL/uL (ref 4.22–5.81)
RDW: 25 % — ABNORMAL HIGH (ref 11.5–15.5)
WBC: 8.7 10*3/uL (ref 4.0–10.5)
nRBC: 0 % (ref 0.0–0.2)

## 2020-03-13 LAB — COMPREHENSIVE METABOLIC PANEL
ALT: 26 U/L (ref 0–44)
AST: 26 U/L (ref 15–41)
Albumin: 4 g/dL (ref 3.5–5.0)
Alkaline Phosphatase: 63 U/L (ref 38–126)
Anion gap: 12 (ref 5–15)
BUN: 52 mg/dL — ABNORMAL HIGH (ref 8–23)
CO2: 27 mmol/L (ref 22–32)
Calcium: 9.3 mg/dL (ref 8.9–10.3)
Chloride: 99 mmol/L (ref 98–111)
Creatinine, Ser: 2.22 mg/dL — ABNORMAL HIGH (ref 0.61–1.24)
GFR calc Af Amer: 33 mL/min — ABNORMAL LOW (ref >60)
GFR calc non Af Amer: 28 mL/min — ABNORMAL LOW (ref >60)
Glucose, Bld: 138 mg/dL — ABNORMAL HIGH (ref 70–99)
Potassium: 3.7 mmol/L (ref 3.5–5.1)
Sodium: 138 mmol/L (ref 135–145)
Total Bilirubin: 0.8 mg/dL (ref 0.3–1.2)
Total Protein: 7.1 g/dL (ref 6.5–8.1)

## 2020-03-13 LAB — LACTATE DEHYDROGENASE: LDH: 103 U/L (ref 98–192)

## 2020-03-13 NOTE — Patient Instructions (Addendum)
[place] at [facility] Discharge Instructions  You were seen today by Dr. Delton Coombes. He went over your history, family history and how you've been feeling lately. You are here today because of an abnormal protein in your blood, you may have a condition called MGUS, we will do blood work to evaluate you for this condition. You will have blood drawn today before leaving the hospital. He will schedule you for a bone survey, which is a scan that checks your long bones. He will see you back in 2 weeks for follow up.   Thank you for choosing [place] at [facility] to provide your oncology and hematology care.  To afford each patient quality time with our provider, please arrive at least 15 minutes before your scheduled appointment time.   If you have a lab appointment with the [facility] please come in thru the  Main Entrance and check in at the main information desk  You need to re-schedule your appointment should you arrive 10 or more minutes late.  We strive to give you quality time with our providers, and arriving late affects you and other patients whose appointments are after yours.  Also, if you no show three or more times for appointments you may be dismissed from the clinic at the providers discretion.     Again, thank you for choosing [facility].  Our hope is that these requests will decrease the amount of time that you wait before being seen by our physicians.       _____________________________________________________________  Should you have questions after your visit to [facility], please contact our office at [phone] between the hours of 8:00 a.m. and 4:30 p.m.  Voicemails left after 4:00 p.m. will not be returned until the following business day.  For prescription refill requests, have your pharmacy contact our office and allow 72 hours.    Cancer Center Support Programs:   > Cancer Support Group   2nd Tuesday of the month 1pm-2pm, Journey Room

## 2020-03-13 NOTE — Progress Notes (Signed)
CONSULT NOTE  Patient Care Team: Monico Blitz, MD as PCP - General (Internal Medicine) Inocencio Homes, DPM as Consulting Physician (Podiatry) Clent Jacks, MD as Referring Physician (Urology) Danie Binder, MD (Inactive) as Consulting Physician (Gastroenterology)  CHIEF COMPLAINTS/PURPOSE OF CONSULTATION:  Monoclonal gammopathy.  HISTORY OF PRESENTING ILLNESS:  [name] [name] 75 y.o. male is seen in consultation today at the request of Dr. Theador Hawthorne for further work-up and management of monoclonal gammopathy.  He was evaluated for chronic kidney disease and UPEP on 11/25/2019 showed poorly defined area of restricted protein mobility reactive to IgG and kappa antisera.  Repeat UPEP on 02/17/2020 showed polyclonal light chains with no monoclonal immunoglobulin or free light chain.  SPEP on 02/17/2020 showed poorly defined area of restricted protein mobility detected and is reactive with lambda light chain antisera.  He reportedly lost about 20 pounds in the last 6 months and was trying to lose some weight by portion control.  He had history of left leg amputation 5 years ago.  He has recent right foot infection and finished Bactrim.  He reports some numbness in the fingertips in the right foot which has been stable.  He reported personal history of prostate cancer and has been on watchful waiting and follows up with urology at [facility].  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  He was never smoker.  Family history positive for maternal cousin with ovarian cancer and paternal aunt with colon cancer.  Denies any new onset bone pains.  Reports appetite of 25%.  Energy levels are 75%.  Has chronic constipation which has been stable.  MEDICAL HISTORY:  Past Medical History:  Diagnosis Date  . Anemia   . Anxiety   . Cancer [facility])    Prostate- Stage I  . Critical lower limb ischemia   . Essential hypertension   . Gangrene of foot [facility]) March 2016    Left foot  . Hyperlipidemia   . Neuropathy   . Type 2 diabetes mellitus ([place])     SURGICAL HISTORY: Past Surgical History:  Procedure Laterality Date  . AMPUTATION Left 01/12/2015   Procedure: AMPUTATION BELOW KNEE;  Surgeon: Leandrew Koyanagi, MD;  Location: [place];  Service: Orthopedics;  Laterality: Left;  . APPLICATION OF WOUND VAC Right 01/12/2015   Procedure: APPLICATION OF WOUND VAC;  Surgeon: Leandrew Koyanagi, MD;  Location: [place];  Service: Orthopedics;  Laterality: Right;  . CARDIAC ELECTROPHYSIOLOGY STUDY AND ABLATION    . CHOLECYSTECTOMY    . ORIF HUMERUS FRACTURE      SOCIAL HISTORY: Social History   Socioeconomic History  . Marital status: Single    Spouse name: Not on file  . Number of children: 0  . Years of education: Not on file  . Highest education level: Not on file  Occupational History  . Occupation: RETIRED  Tobacco Use  . Smoking status: Never Smoker  . Smokeless tobacco: Never Used  Substance and Sexual Activity  . Alcohol use: Yes    Alcohol/week: 0.0 standard drinks    Comment: rare  . Drug use: No  . Sexual activity: Not Currently  Other Topics Concern  . Not on file  Social History Narrative  . Not on file   Social Determinants of Health   Financial Resource Strain: Medium Risk  . Difficulty of Paying Living Expenses: Somewhat hard  Food Insecurity: Food Insecurity Present  . Worried About [employer] of  Food in the Last Year: Sometimes true  . Ran Out of Food in the Last Year: Sometimes true  Transportation Needs: Unmet Transportation Needs  . Lack of Transportation (Medical): Yes  . Lack of Transportation (Non-Medical): Yes  Physical Activity: Inactive  . Days of Exercise per Week: 0 days  . Minutes of Exercise per Session: 0 min  Stress: No Stress Concern Present  . Feeling of Stress : Not at all  Social Connections: Moderately Isolated  . Frequency of Communication with Friends and Family: More than three times a week  . Frequency of  Social Gatherings with Friends and Family: Twice a week  . Attends Religious Services: Never  . Active Member of Clubs or Organizations: No  . Attends Archivist Meetings: Never  . Marital Status: Never married  Intimate Partner Violence: Not At Risk  . Fear of Current or Ex-Partner: No  . Emotionally Abused: No  . Physically Abused: No  . Sexually Abused: No    FAMILY HISTORY: Family History  Problem Relation Age of Onset  . Hypertension Father        and kidney failure  . Heart disease Father   . Heart attack Father   . Kidney disease Father   . Alzheimer's disease Mother   . Heart attack Maternal Grandfather   . Heart attack Paternal Grandfather   . Colon cancer Paternal Aunt 79       s/p surgical rsxn; lived to 74 yrs old  . Hypertension Sister   . Hypercholesterolemia Sister   . Cervical cancer Sister   . Gastric cancer Neg Hx   . Esophageal cancer Neg Hx     ALLERGIES:  has No Known Allergies.  MEDICATIONS:  Current Outpatient Medications  Medication Sig Dispense Refill  . ALPRAZolam (XANAX) 1 MG tablet Take 1.5 mg by mouth at bedtime.    Marland Kitchen ascorbic acid (VITAMIN C) 1000 MG tablet Take 1,000 mg by mouth daily in the afternoon.     . cadexomer iodine (IODOSORB) 0.9 % gel Apply 1 application topically daily as needed for wound care.    . Calcium Carbonate (CALCIUM 600 PO) Take 600 mg by mouth daily in the afternoon.    . finasteride (PROSCAR) 5 MG tablet Take 5 mg by mouth daily in the afternoon.     . furosemide (LASIX) 40 MG tablet Take 40 mg by mouth daily in the afternoon.     . gabapentin (NEURONTIN) 300 MG capsule Take 600 mg by mouth 2 (two) times daily as needed (pain.).     Marland Kitchen glimepiride (AMARYL) 4 MG tablet Take 4 mg by mouth daily in the afternoon.     . hydrALAZINE (APRESOLINE) 100 MG tablet Take 100 mg by mouth in the morning and at bedtime.    Marland Kitchen losartan (COZAAR) 25 MG tablet Take 25 mg by mouth daily.    . metoprolol (LOPRESSOR) 100 MG  tablet Take 100 mg by mouth 2 (two) times daily.    . Multiple Vitamins-Minerals (MULTIVITAMIN WITH MINERALS) tablet Take 1 tablet by mouth daily in the afternoon.     . Omega-3 Fatty Acids (FISH OIL) 1200 MG CAPS Take 1,200 mg by mouth daily in the afternoon.    . simvastatin (ZOCOR) 40 MG tablet Take 40 mg by mouth daily in the afternoon.     . tamsulosin (FLOMAX) 0.4 MG CAPS capsule Take 0.4 mg by mouth daily in the afternoon.     . vitamin E 400 UNIT capsule  Take 400 Units by mouth daily.     No current facility-administered medications for this visit.    REVIEW OF SYSTEMS:   Constitutional: Denies fevers, chills or abnormal night sweats.  Positive for a 20 pound weight loss. Eyes: Denies blurriness of vision, double vision or watery eyes Ears, nose, mouth, throat, and face: Denies mucositis or sore throat Respiratory: Denies cough, dyspnea or wheezes Cardiovascular: Denies palpitation, chest discomfort or lower extremity swelling Gastrointestinal:  Denies nausea, heartburn or change in bowel habits Skin: Denies abnormal skin rashes Lymphatics: Denies new lymphadenopathy or easy bruising Neurological: Positive for numbness in the fingertips and right foot. Behavioral/Psych: Mood is stable, no new changes  All other systems were reviewed with the patient and are negative.  PHYSICAL EXAMINATION: ECOG PERFORMANCE STATUS: 1 - Symptomatic but completely ambulatory  Vitals:   03/13/20 1342  BP: 104/65  Pulse: (!) 59  Resp: 18  Temp: (!) 96.9 F (36.1 C)  SpO2: 94%   Filed Weights   03/13/20 1342  Weight: 226 lb 8 oz (102.7 kg)    GENERAL:alert, no distress and comfortable SKIN: skin color, texture, turgor are normal, no rashes or significant lesions EYES: normal, conjunctiva are pink and non-injected, sclera clear OROPHARYNX:no exudate, no erythema and lips, buccal mucosa, and tongue normal  NECK: supple, thyroid normal size, non-tender, without nodularity LYMPH:  no  palpable lymphadenopathy in the cervical, axillary or inguinal LUNGS: clear to auscultation and percussion with normal breathing effort HEART: regular rate & rhythm and no murmurs.  Trace right leg edema. ABDOMEN:abdomen soft, non-tender and normal bowel sounds Musculoskeletal:no cyanosis of digits and no clubbing  PSYCH: alert & oriented x 3 with fluent speech NEURO: no focal motor/sensory deficits  LABORATORY DATA:  I have reviewed the data as listed Recent Results (from the past 2160 hour(s))  SARS CORONAVIRUS 2 (TAT 6-24 HRS) Nasopharyngeal Nasopharyngeal Swab     Status: None   Collection Time: 01/27/20  7:41 AM   Specimen: Nasopharyngeal Swab  Result Value Ref Range   SARS Coronavirus 2 NEGATIVE NEGATIVE    Comment: (NOTE) SARS-CoV-2 target nucleic acids are NOT DETECTED. The SARS-CoV-2 RNA is generally detectable in upper and lower respiratory specimens during the acute phase of infection. Negative results do not preclude SARS-CoV-2 infection, do not rule out co-infections with other pathogens, and should not be used as the sole basis for treatment or other patient management decisions. Negative results must be combined with clinical observations, patient history, and epidemiological information. The expected result is Negative. Fact Sheet for Patients: SugarRoll.be Fact Sheet for Healthcare Providers: https://www.woods-mathews.com/ This test is not yet approved or cleared by the [place] FDA and  has been authorized for detection and/or diagnosis of SARS-CoV-2 by FDA under an Emergency Use Authorization (EUA). This EUA will remain  in effect (meaning this test can be used) for the duration of the COVID-19 declaration under Section 56 4(b)(1) of the Act, 21 U.S.C. section 360bbb-3(b)(1), unless the authorization is terminated or revoked sooner. Performed at [facility], [place] [address], [place], [place] [address]    Basic metabolic panel     Status: Abnormal   Collection Time: 01/27/20  3:36 PM  Result Value Ref Range   Sodium 133 (L) 135 - 145 mmol/L   Potassium 4.2 3.5 - 5.1 mmol/L   Chloride 98 98 - 111 mmol/L   CO2 22 22 - 32 mmol/L   Glucose, Bld 80 70 - 99 mg/dL    Comment: Glucose  reference range applies only to samples taken after fasting for at least 8 hours.   BUN 42 (H) 8 - 23 mg/dL   Creatinine, Ser 3.28 (H) 0.61 - 1.24 mg/dL   Calcium 9.1 8.9 - 10.3 mg/dL   GFR calc non Af Amer 18 (L) >60 mL/min   GFR calc Af Amer 20 (L) >60 mL/min   Anion gap 13 5 - 15    Comment: Performed at [facility], [address] [place], [place] [address]  CBC with Differential/Platelet     Status: Abnormal   Collection Time: 01/27/20  3:36 PM  Result Value Ref Range   WBC 6.2 4.0 - 10.5 K/uL   RBC 4.76 4.22 - 5.81 MIL/uL   Hemoglobin 11.7 (L) 13.0 - 17.0 g/dL   HCT 38.2 (L) 39.0 - 52.0 %   MCV 80.3 80.0 - 100.0 fL   MCH 24.6 (L) 26.0 - 34.0 pg   MCHC 30.6 30.0 - 36.0 g/dL   RDW 26.5 (H) 11.5 - 15.5 %   Platelets 256 150 - 400 K/uL   nRBC 0.0 0.0 - 0.2 %   Neutrophils Relative % 63 %   Neutro Abs 3.8 1.7 - 7.7 K/uL   Lymphocytes Relative 24 %   Lymphs Abs 1.5 0.7 - 4.0 K/uL   Monocytes Relative 11 %   Monocytes Absolute 0.7 0.1 - 1.0 K/uL   Eosinophils Relative 1 %   Eosinophils Absolute 0.1 0.0 - 0.5 K/uL   Basophils Relative 1 %   Basophils Absolute 0.1 0.0 - 0.1 K/uL   Immature Granulocytes 0 %   Abs Immature Granulocytes 0.02 0.00 - 0.07 K/uL   Spherocytes PRESENT     Comment: Performed at [facility], [address] [place], [place] [address]    RADIOGRAPHIC STUDIES: I have personally reviewed the radiological images as listed and agreed with the findings in the report.  ASSESSMENT & PLAN:  Monoclonal gammopathy 1.  Monoclonal gammopathy: -Patient seen at the request of Dr. Theador Hawthorne for further work-up and management of monoclonal gammopathy. -UPEP done on 11/25/2019 showed poorly  defined area of restricted protein mobility reactive to IgG and kappa antisera.  Repeat UPEP on 02/17/2020 showed polyclonal light chains with no monoclonal immunoglobulin or free light chain.  SPEP on February 17, 2020 showed poorly defined area of restricted protein mobility detected and is reactive with lambda light chain antisera. -Creatinine was 1.79 with calcium of 9.7 and albumin of 3.8.  Hemoglobin was 12.5. -He reported 20 pound weight loss/6 months and was trying to lose weight with portion control. -He recently had right foot infection and finished Bactrim. -We will repeat his SPEP, check immunofixation and free light chain assay along with LDH today.  We will also do routine labs.  We will also obtain a skeletal survey to evaluate for any lytic lesions. -We will see him back in 2 weeks to discuss results.  2.  CKD: -CKD stage IIIb since 2016 secondary to diabetes.  He has nephrotic range proteinuria. -Ultrasound of the kidneys on 12/06/2019 shows bilateral renal cysts with no obstructing lesions.  Renal cortical thickness and renal echogenicity normal bilaterally.  3.  Normocytic anemia: -Hemoglobin on 02/17/2020 was 12.5.  Percent saturation was 23 and TIBC was 275.  He did receive IV iron with Dr. Theador Hawthorne.  His energy levels have improved after the infusion.  4.  Peripheral neuropathy: -Reported numbness in the fingertips and right foot which has been stable from diabetes.  5.  Prostate cancer: -He follows up with Dr. Brendia Sacks at [facility] and is under watchful waiting.     All questions were answered. The patient knows to call the clinic with any problems, questions or concerns.      Derek Jack, MD 03/13/20 2:14 PM

## 2020-03-13 NOTE — Assessment & Plan Note (Addendum)
1.  Monoclonal gammopathy: -Patient seen at the request of Dr. Theador Hawthorne for further work-up and management of monoclonal gammopathy. -UPEP done on 11/25/2019 showed poorly defined area of restricted protein mobility reactive to IgG and kappa antisera.  Repeat UPEP on 02/17/2020 showed polyclonal light chains with no monoclonal immunoglobulin or free light chain.  SPEP on February 17, 2020 showed poorly defined area of restricted protein mobility detected and is reactive with lambda light chain antisera. -Creatinine was 1.79 with calcium of 9.7 and albumin of 3.8.  Hemoglobin was 12.5. -He reported 20 pound weight loss/6 months and was trying to lose weight with portion control. -He recently had right foot infection and finished Bactrim. -We will repeat his SPEP, check immunofixation and free light chain assay along with LDH today.  We will also do routine labs.  We will also obtain a skeletal survey to evaluate for any lytic lesions. -We will see him back in 2 weeks to discuss results.  2.  CKD: -CKD stage IIIb since 2016 secondary to diabetes.  He has nephrotic range proteinuria. -Ultrasound of the kidneys on 12/06/2019 shows bilateral renal cysts with no obstructing lesions.  Renal cortical thickness and renal echogenicity normal bilaterally.  3.  Normocytic anemia: -Hemoglobin on 02/17/2020 was 12.5.  Percent saturation was 23 and TIBC was 275.  He did receive IV iron with Dr. Theador Hawthorne.  His energy levels have improved after the infusion.  4.  Peripheral neuropathy: -Reported numbness in the fingertips and right foot which has been stable from diabetes.  5.  Prostate cancer: -He follows up with Dr. Brendia Sacks at [facility] and is under watchful waiting.

## 2020-03-14 ENCOUNTER — Ambulatory Visit (HOSPITAL_COMMUNITY)
Admission: RE | Admit: 2020-03-14 | Discharge: 2020-03-14 | Disposition: A | Discharge status: Home / Self Care | Payer: Medicare Other | Source: Ambulatory Visit | Attending: Nephrology | Admitting: Nephrology

## 2020-03-14 DIAGNOSIS — D638 Anemia in other chronic diseases classified elsewhere: Secondary | ICD-10-CM | POA: Diagnosis not present

## 2020-03-14 DIAGNOSIS — R809 Proteinuria, unspecified: Secondary | ICD-10-CM | POA: Diagnosis not present

## 2020-03-14 DIAGNOSIS — Z79899 Other long term (current) drug therapy: Secondary | ICD-10-CM | POA: Insufficient documentation

## 2020-03-14 DIAGNOSIS — Z7901 Long term (current) use of anticoagulants: Secondary | ICD-10-CM | POA: Insufficient documentation

## 2020-03-14 DIAGNOSIS — F419 Anxiety disorder, unspecified: Secondary | ICD-10-CM | POA: Diagnosis not present

## 2020-03-14 DIAGNOSIS — E785 Hyperlipidemia, unspecified: Secondary | ICD-10-CM | POA: Diagnosis not present

## 2020-03-14 DIAGNOSIS — N189 Chronic kidney disease, unspecified: Secondary | ICD-10-CM | POA: Insufficient documentation

## 2020-03-14 DIAGNOSIS — D472 Monoclonal gammopathy: Secondary | ICD-10-CM

## 2020-03-14 DIAGNOSIS — E1129 Type 2 diabetes mellitus with other diabetic kidney complication: Secondary | ICD-10-CM

## 2020-03-14 DIAGNOSIS — N281 Cyst of kidney, acquired: Secondary | ICD-10-CM | POA: Diagnosis not present

## 2020-03-14 DIAGNOSIS — E1122 Type 2 diabetes mellitus with diabetic chronic kidney disease: Secondary | ICD-10-CM

## 2020-03-14 DIAGNOSIS — I129 Hypertensive chronic kidney disease with stage 1 through stage 4 chronic kidney disease, or unspecified chronic kidney disease: Secondary | ICD-10-CM | POA: Insufficient documentation

## 2020-03-14 DIAGNOSIS — N2581 Secondary hyperparathyroidism of renal origin: Secondary | ICD-10-CM

## 2020-03-14 LAB — PROTEIN ELECTROPHORESIS, SERUM
A/G Ratio: 1.3 (ref 0.7–1.7)
Albumin ELP: 3.7 g/dL (ref 2.9–4.4)
Alpha-1-Globulin: 0.2 g/dL (ref 0.0–0.4)
Alpha-2-Globulin: 0.8 g/dL (ref 0.4–1.0)
Beta Globulin: 1 g/dL (ref 0.7–1.3)
Gamma Globulin: 0.9 g/dL (ref 0.4–1.8)
Globulin, Total: 2.8 g/dL (ref 2.2–3.9)
Total Protein ELP: 6.5 g/dL (ref 6.0–8.5)

## 2020-03-14 LAB — CBC
HCT: 47.4 % (ref 39.0–52.0)
Hemoglobin: 15 g/dL (ref 13.0–17.0)
MCH: 27.5 pg (ref 26.0–34.0)
MCHC: 31.6 g/dL (ref 30.0–36.0)
MCV: 87 fL (ref 80.0–100.0)
Platelets: 228 10*3/uL (ref 150–400)
RBC: 5.45 MIL/uL (ref 4.22–5.81)
RDW: 24.4 % — ABNORMAL HIGH (ref 11.5–15.5)
WBC: 9.6 10*3/uL (ref 4.0–10.5)
nRBC: 0 % (ref 0.0–0.2)

## 2020-03-14 LAB — GLUCOSE, CAPILLARY: Glucose-Capillary: 105 mg/dL — ABNORMAL HIGH (ref 70–99)

## 2020-03-14 LAB — PROTIME-INR
INR: 1 (ref 0.8–1.2)
Prothrombin Time: 12.5 seconds (ref 11.4–15.2)

## 2020-03-14 LAB — KAPPA/LAMBDA LIGHT CHAINS
Kappa free light chain: 67.6 mg/L — ABNORMAL HIGH (ref 3.3–19.4)
Kappa, lambda light chain ratio: 3.31 — ABNORMAL HIGH (ref 0.26–1.65)
Lambda free light chains: 20.4 mg/L (ref 5.7–26.3)

## 2020-03-14 MED ORDER — SODIUM CHLORIDE 0.9 % IV SOLN: INTRAVENOUS | Status: DC

## 2020-03-14 MED ORDER — GELATIN ABSORBABLE 12-7 MM EX MISC
CUTANEOUS | Status: AC
  Filled 2020-03-14: qty 1

## 2020-03-14 MED ORDER — LIDOCAINE HCL (PF) 1 % IJ SOLN
INTRAMUSCULAR | Status: AC
  Filled 2020-03-14: qty 30

## 2020-03-14 MED ORDER — MIDAZOLAM HCL 2 MG/2ML IJ SOLN
INTRAMUSCULAR | Status: AC | PRN
  Administered 2020-03-14: 1 mg via INTRAVENOUS

## 2020-03-14 MED ORDER — MIDAZOLAM HCL 2 MG/2ML IJ SOLN
INTRAMUSCULAR | Status: AC
  Filled 2020-03-14: qty 2

## 2020-03-14 MED ORDER — SODIUM CHLORIDE 0.9 % IV SOLN
INTRAVENOUS | Status: AC | PRN
  Administered 2020-03-14: 10 mL/h via INTRAVENOUS

## 2020-03-14 MED ORDER — FENTANYL CITRATE (PF) 100 MCG/2ML IJ SOLN
INTRAMUSCULAR | Status: AC
  Filled 2020-03-14: qty 2

## 2020-03-14 MED ORDER — FENTANYL CITRATE (PF) 100 MCG/2ML IJ SOLN
INTRAMUSCULAR | Status: AC | PRN
  Administered 2020-03-14: 50 ug via INTRAVENOUS

## 2020-03-14 NOTE — Progress Notes (Signed)
Pt has been stable with stable VS. Ambulated without difficulty.  Pt states his sister who is picking him up is staying with him tonight.Discharged with sister , [facility]

## 2020-03-14 NOTE — Procedures (Signed)
Interventional Radiology Procedure Note  Procedure: US Guided Biopsy of right kidney  Complications: None  Estimated Blood Loss: < 10 mL  Findings: 16 G core biopsy of right kidney performed under US guidance.  Two core samples obtained and sent to Pathology. Gelfoam pledgets injected via 15 G outer needle after biopsy.  Venetia Night. Kathlene Cote, M.D Pager:  [phone]

## 2020-03-14 NOTE — Discharge Instructions (Signed)
Percutaneous Kidney Biopsy, Care After This sheet gives you information about how to care for yourself after your procedure. Your health care provider may also give you more specific instructions. If you have problems or questions, contact your health care provider. What can I expect after the procedure? After the procedure, it is common to have:  Pain or soreness near the biopsy site.  Pink or cloudy urine for 24 hours after the procedure. Follow these instructions at home: Activity  Return to your normal activities as told by your health care provider. Ask your health care provider what activities are safe for you.  If you were given a sedative during the procedure, it can affect you for several hours. Do not drive or operate machinery until your health care provider says that it is safe.  Do not lift anything that is heavier than 10 lb (4.5 kg), or the limit that you are told, until your health care provider says that it is safe.  Avoid activities that take a lot of effort (are strenuous) until your health care provider approves. Most people will have to wait 2 weeks before returning to activities such as exercise or sex. General instructions   Take over-the-counter and prescription medicines only as told by your health care provider.  You may eat and drink after your procedure. Follow instructions from your health care provider about eating or drinking restrictions.  Check your biopsy site every day for signs of infection. Check for: ? More redness, swelling, or pain. ? Fluid or blood. ? Warmth. ? Pus or a bad smell.  Keep all follow-up visits as told by your health care provider. This is important. Contact a health care provider if:  You have more redness, swelling, or pain around your biopsy site.  You have fluid or blood coming from your biopsy site.  Your biopsy site feels warm to the touch.  You have pus or a bad smell coming from your biopsy site.  You have blood  in your urine more than 24 hours after your procedure.  You have a fever. Get help right away if:  Your urine is dark red or brown.  You cannot urinate.  It burns when you urinate.  You feel dizzy or light-headed.  You have severe pain in your abdomen or side. Summary  After the procedure, it is common to have pain or soreness at the biopsy site and pink or cloudy urine for the first 24 hours.  Check your biopsy site each day for signs of infection, such as more redness, swelling, or pain; fluid, blood, pus or a bad smell coming from the biopsy site; or the biopsy site feeling warm to touch.  Return to your normal activities as told by your health care provider. This information is not intended to replace advice given to you by your health care provider. Make sure you discuss any questions you have with your health care provider. Document Revised: 06/09/2019 Document Reviewed: 06/09/2019 Elsevier Patient Education  2020 [employer]  

## 2020-03-14 NOTE — H&P (Signed)
Chief Complaint: Patient was seen in consultation today for chronic kidney disease  Referring Physician(s): Alden S  Supervising Physician: Aletta Edouard  Patient Status: [facility] - Out-pt  History of Present Illness: [name] [name] is a 74 y.o. male with past medical history of anxiety, Stage 1 prostate cancer, HTN, DM2, and monoclonal gammopathy, and recent progressive CKD presents for renal biopsy at the request of Dr. Theador Hawthorne.  Patient presents in his usual state of health today.  He has been NPO. Denies new complaints or concerns.  He does not take blood thinners.   Past Medical History:  Diagnosis Date  . Anemia   . Anxiety   . Cancer [facility])    Prostate- Stage I  . Critical lower limb ischemia   . Essential hypertension   . Gangrene of foot [facility]) March 2016   Left foot  . Hyperlipidemia   . Neuropathy   . Type 2 diabetes mellitus ([place])     Past Surgical History:  Procedure Laterality Date  . AMPUTATION Left 01/12/2015   Procedure: AMPUTATION BELOW KNEE;  Surgeon: Leandrew Koyanagi, MD;  Location: [place];  Service: Orthopedics;  Laterality: Left;  . APPLICATION OF WOUND VAC Right 01/12/2015   Procedure: APPLICATION OF WOUND VAC;  Surgeon: Leandrew Koyanagi, MD;  Location: [place];  Service: Orthopedics;  Laterality: Right;  . CARDIAC ELECTROPHYSIOLOGY STUDY AND ABLATION    . CHOLECYSTECTOMY    . ORIF HUMERUS FRACTURE      Allergies: Patient has no known allergies.  Medications: Prior to Admission medications   Medication Sig Start Date End Date Taking? Authorizing Provider  ALPRAZolam Duanne Moron) 1 MG tablet Take 1.5 mg by mouth at bedtime. 12/21/19  Yes [provider]  ascorbic acid (VITAMIN C) 1000 MG tablet Take 1,000 mg by mouth daily in the afternoon.    Yes [provider]  cadexomer iodine (IODOSORB) 0.9 % gel Apply 1 application topically daily as needed for wound care.   Yes [provider]  Calcium Carbonate (CALCIUM 600 PO) Take  600 mg by mouth daily in the afternoon.   Yes [provider]  finasteride (PROSCAR) 5 MG tablet Take 5 mg by mouth daily in the afternoon.  08/29/15  Yes [provider]  furosemide (LASIX) 40 MG tablet Take 40 mg by mouth daily in the afternoon.  01/06/20  Yes [provider]  gabapentin (NEURONTIN) 300 MG capsule Take 600 mg by mouth 2 (two) times daily as needed (pain.).  01/23/20  Yes [provider]  glimepiride (AMARYL) 4 MG tablet Take 4 mg by mouth daily in the afternoon.  01/23/20  Yes [provider]  hydrALAZINE (APRESOLINE) 100 MG tablet Take 100 mg by mouth in the morning and at bedtime.   Yes [provider]  losartan (COZAAR) 25 MG tablet Take 25 mg by mouth daily. 02/24/20  Yes [provider]  metoprolol (LOPRESSOR) 100 MG tablet Take 100 mg by mouth 2 (two) times daily.   Yes [provider]  Multiple Vitamins-Minerals (MULTIVITAMIN WITH MINERALS) tablet Take 1 tablet by mouth daily in the afternoon.    Yes [provider]  Omega-3 Fatty Acids (FISH OIL) 1200 MG CAPS Take 1,200 mg by mouth daily in the afternoon.   Yes [provider]  simvastatin (ZOCOR) 40 MG tablet Take 40 mg by mouth daily in the afternoon.    Yes [provider]  tamsulosin (FLOMAX) 0.4 MG CAPS capsule Take 0.4 mg by mouth  daily in the afternoon.    Yes [provider]  vitamin E 400 UNIT capsule Take 400 Units by mouth daily.   Yes [provider]     Family History  Problem Relation Age of Onset  . Hypertension Father        and kidney failure  . Heart disease Father   . Heart attack Father   . Kidney disease Father   . Alzheimer's disease Mother   . Heart attack Maternal Grandfather   . Heart attack Paternal Grandfather   . Colon cancer Paternal Aunt 63       s/p surgical rsxn; lived to 74 yrs old  . Hypertension Sister   . Hypercholesterolemia Sister   . Cervical cancer Sister   .  Gastric cancer Neg Hx   . Esophageal cancer Neg Hx     Social History   Socioeconomic History  . Marital status: Single    Spouse name: Not on file  . Number of children: 0  . Years of education: Not on file  . Highest education level: Not on file  Occupational History  . Occupation: RETIRED  Tobacco Use  . Smoking status: Never Smoker  . Smokeless tobacco: Never Used  Substance and Sexual Activity  . Alcohol use: Yes    Alcohol/week: 0.0 standard drinks    Comment: rare  . Drug use: No  . Sexual activity: Not Currently  Other Topics Concern  . Not on file  Social History Narrative  . Not on file   Social Determinants of Health   Financial Resource Strain: Medium Risk  . Difficulty of Paying Living Expenses: Somewhat hard  Food Insecurity: Food Insecurity Present  . Worried About Charity fundraiser in the Last Year: Sometimes true  . Ran Out of Food in the Last Year: Sometimes true  Transportation Needs: Unmet Transportation Needs  . Lack of Transportation (Medical): Yes  . Lack of Transportation (Non-Medical): Yes  Physical Activity: Inactive  . Days of Exercise per Week: 0 days  . Minutes of Exercise per Session: 0 min  Stress: No Stress Concern Present  . Feeling of Stress : Not at all  Social Connections: Moderately Isolated  . Frequency of Communication with Friends and Family: More than three times a week  . Frequency of Social Gatherings with Friends and Family: Twice a week  . Attends Religious Services: Never  . Active Member of Clubs or Organizations: No  . Attends Archivist Meetings: Never  . Marital Status: Never married     Review of Systems: A 12 point ROS discussed and pertinent positives are indicated in the HPI above.  All other systems are negative.  Review of Systems  Constitutional: Negative for fatigue and fever.  Respiratory: Negative for cough and shortness of breath.   Cardiovascular: Negative for chest pain.    Gastrointestinal: Negative for abdominal pain, nausea and vomiting.  Genitourinary: Negative for dysuria, frequency and urgency.  Musculoskeletal: Negative for back pain.  Psychiatric/Behavioral: Negative for behavioral problems and confusion.    Vital Signs: BP 117/74   Pulse (!) 56   Temp 97.8 F (36.6 C) (Skin)   Resp 18   Ht 5' 7"  (1.702 m)   Wt 226 lb (102.5 kg)   SpO2 100%   BMI 35.40 kg/m   Physical Exam Vitals and nursing note reviewed.  Constitutional:      General: He is not in acute distress.    Appearance: Normal appearance. He is  not ill-appearing.  HENT:     Mouth/Throat:     Mouth: Mucous membranes are moist.     Pharynx: Oropharynx is clear.  Cardiovascular:     Rate and Rhythm: Normal rate and regular rhythm.  Pulmonary:     Effort: Pulmonary effort is normal. No respiratory distress.     Breath sounds: Normal breath sounds.  Abdominal:     General: Abdomen is flat. Bowel sounds are normal.     Palpations: Abdomen is soft.  Skin:    General: Skin is warm and dry.  Neurological:     General: No focal deficit present.     Mental Status: He is alert and oriented to person, place, and time. Mental status is at baseline.  Psychiatric:        Mood and Affect: Mood normal.        Behavior: Behavior normal.        Thought Content: Thought content normal.        Judgment: Judgment normal.      MD Evaluation Airway: WNL Heart: WNL Abdomen: WNL Chest/ Lungs: WNL ASA  Classification: 3 Mallampati/Airway Score: One   Imaging: DG Bone Survey Met  Result Date: 03/14/2020 CLINICAL DATA:  Monoclonal gammopathy . EXAM: METASTATIC BONE SURVEY COMPARISON:  None. FINDINGS: Peripherally sclerotic circular defects within the distal right radius from previous plate fixation noted. Large cervical spine ventral flowing syndesmophytes are identified with maintenance of the disc spaces noted Spondylosis noted throughout the thoracic spine. Degenerative changes  noted within both shoulder and both hip joints. Status post left lower extremity below the knee amputation. No lucent lesions of myeloma identified. IMPRESSION: 1. No lucent lesions of myeloma identified 2. Osseous within the cervical spine suggestive of diffuse idiopathic skeletal hyperostosis (DISH) 3. Thoracic and lumbar spine degenerative changes noted. Electronically Signed   By: Kerby Moors M.D.   On: 03/14/2020 08:21    Labs:  CBC: Recent Labs    01/27/20 1536 03/13/20 1505 03/14/20 0621  WBC 6.2 8.7 9.6  HGB 11.7* 15.0 15.0  HCT 38.2* 47.1 47.4  PLT 256 260 228    COAGS: Recent Labs    03/14/20 0621  INR 1.0    BMP: Recent Labs    01/27/20 1536 03/13/20 1505  NA 133* 138  K 4.2 3.7  CL 98 99  CO2 22 27  GLUCOSE 80 138*  BUN 42* 52*  CALCIUM 9.1 9.3  CREATININE 3.28* 2.22*  GFRNONAA 18* 28*  GFRAA 20* 33*    LIVER FUNCTION TESTS: Recent Labs    03/13/20 1505  BILITOT 0.8  AST 26  ALT 26  ALKPHOS 63  PROT 7.1  ALBUMIN 4.0    TUMOR MARKERS: No results for input(s): AFPTM, CEA, CA199, CHROMGRNA in the last 8760 hours.  Assessment and Plan: Patient with past medical history of HTN, HLD presents with complaint of progessive renal failure.  IR consulted for renal biopsy at the request of Dr. Theador Hawthorne. Case reviewed by Dr. Kathlene Cote who approves patient for procedure.  Patient presents today in their usual state of health.  He has been NPO and is not currently on blood thinners.    Risks and benefits was discussed with the patient and/or patient's family including, but not limited to bleeding, infection, damage to adjacent structures or low yield requiring additional tests.  All of the questions were answered and there is agreement to proceed.  Consent signed and in chart.    Thank you for this interesting  consult.  I greatly enjoyed meeting Yates Weisgerber and look forward to participating in their care.  A copy of this report was sent to the  requesting provider on this date.  Electronically Signed: Docia Barrier, PA 03/14/2020, 8:34 AM   I spent a total of  30 Minutes   in face to face in clinical consultation, greater than 50% of which was counseling/coordinating care for chronic renal disease.

## 2020-03-15 DIAGNOSIS — I129 Hypertensive chronic kidney disease with stage 1 through stage 4 chronic kidney disease, or unspecified chronic kidney disease: Secondary | ICD-10-CM | POA: Diagnosis not present

## 2020-03-15 DIAGNOSIS — L97511 Non-pressure chronic ulcer of other part of right foot limited to breakdown of skin: Secondary | ICD-10-CM | POA: Diagnosis not present

## 2020-03-15 DIAGNOSIS — E1151 Type 2 diabetes mellitus with diabetic peripheral angiopathy without gangrene: Secondary | ICD-10-CM | POA: Diagnosis not present

## 2020-03-15 DIAGNOSIS — N1832 Chronic kidney disease, stage 3b: Secondary | ICD-10-CM | POA: Diagnosis not present

## 2020-03-15 DIAGNOSIS — E11621 Type 2 diabetes mellitus with foot ulcer: Secondary | ICD-10-CM | POA: Diagnosis not present

## 2020-03-15 DIAGNOSIS — E1122 Type 2 diabetes mellitus with diabetic chronic kidney disease: Secondary | ICD-10-CM | POA: Diagnosis not present

## 2020-03-16 DIAGNOSIS — Z79899 Other long term (current) drug therapy: Secondary | ICD-10-CM | POA: Diagnosis not present

## 2020-03-16 DIAGNOSIS — N189 Chronic kidney disease, unspecified: Secondary | ICD-10-CM | POA: Diagnosis not present

## 2020-03-16 DIAGNOSIS — L97412 Non-pressure chronic ulcer of right heel and midfoot with fat layer exposed: Secondary | ICD-10-CM | POA: Diagnosis not present

## 2020-03-16 DIAGNOSIS — E7800 Pure hypercholesterolemia, unspecified: Secondary | ICD-10-CM | POA: Diagnosis not present

## 2020-03-16 DIAGNOSIS — L97519 Non-pressure chronic ulcer of other part of right foot with unspecified severity: Secondary | ICD-10-CM | POA: Diagnosis not present

## 2020-03-16 DIAGNOSIS — I129 Hypertensive chronic kidney disease with stage 1 through stage 4 chronic kidney disease, or unspecified chronic kidney disease: Secondary | ICD-10-CM | POA: Diagnosis not present

## 2020-03-16 DIAGNOSIS — Z89512 Acquired absence of left leg below knee: Secondary | ICD-10-CM | POA: Diagnosis not present

## 2020-03-16 DIAGNOSIS — Z9049 Acquired absence of other specified parts of digestive tract: Secondary | ICD-10-CM | POA: Diagnosis not present

## 2020-03-16 DIAGNOSIS — I4892 Unspecified atrial flutter: Secondary | ICD-10-CM | POA: Diagnosis not present

## 2020-03-16 DIAGNOSIS — Z7984 Long term (current) use of oral hypoglycemic drugs: Secondary | ICD-10-CM | POA: Diagnosis not present

## 2020-03-16 DIAGNOSIS — M869 Osteomyelitis, unspecified: Secondary | ICD-10-CM | POA: Diagnosis not present

## 2020-03-16 DIAGNOSIS — E11621 Type 2 diabetes mellitus with foot ulcer: Secondary | ICD-10-CM | POA: Diagnosis not present

## 2020-03-16 DIAGNOSIS — E1122 Type 2 diabetes mellitus with diabetic chronic kidney disease: Secondary | ICD-10-CM | POA: Diagnosis not present

## 2020-03-16 DIAGNOSIS — F419 Anxiety disorder, unspecified: Secondary | ICD-10-CM | POA: Diagnosis not present

## 2020-03-16 LAB — IMMUNOFIXATION ELECTROPHORESIS
IgA: 213 mg/dL (ref 61–437)
IgG (Immunoglobin G), Serum: 895 mg/dL (ref 603–1613)
IgM (Immunoglobulin M), Srm: 74 mg/dL (ref 15–143)
Total Protein ELP: 6.4 g/dL (ref 6.0–8.5)

## 2020-03-18 DIAGNOSIS — E119 Type 2 diabetes mellitus without complications: Secondary | ICD-10-CM | POA: Diagnosis not present

## 2020-03-20 DIAGNOSIS — E1151 Type 2 diabetes mellitus with diabetic peripheral angiopathy without gangrene: Secondary | ICD-10-CM | POA: Diagnosis not present

## 2020-03-20 DIAGNOSIS — N1832 Chronic kidney disease, stage 3b: Secondary | ICD-10-CM | POA: Diagnosis not present

## 2020-03-20 DIAGNOSIS — E11621 Type 2 diabetes mellitus with foot ulcer: Secondary | ICD-10-CM | POA: Diagnosis not present

## 2020-03-20 DIAGNOSIS — L97511 Non-pressure chronic ulcer of other part of right foot limited to breakdown of skin: Secondary | ICD-10-CM | POA: Diagnosis not present

## 2020-03-20 DIAGNOSIS — E1122 Type 2 diabetes mellitus with diabetic chronic kidney disease: Secondary | ICD-10-CM | POA: Diagnosis not present

## 2020-03-20 DIAGNOSIS — I129 Hypertensive chronic kidney disease with stage 1 through stage 4 chronic kidney disease, or unspecified chronic kidney disease: Secondary | ICD-10-CM | POA: Diagnosis not present

## 2020-03-21 ENCOUNTER — Telehealth: Payer: Self-pay | Admitting: Internal Medicine

## 2020-03-21 NOTE — Telephone Encounter (Signed)
[phone]  PATIENT CALLED AND SAID THAT HE DID NOT HAVE HIS PROCEDURE DONE AND WANTED TO KNOW IF YOU STILL WANTED HIM TO HAVE THE FOLLOW UP APPOINTMENT HE IS ON THE SCHEDULE FOR NEXT WEEK, PLEASE ADVISE

## 2020-03-22 DIAGNOSIS — E1151 Type 2 diabetes mellitus with diabetic peripheral angiopathy without gangrene: Secondary | ICD-10-CM | POA: Diagnosis not present

## 2020-03-22 DIAGNOSIS — E1122 Type 2 diabetes mellitus with diabetic chronic kidney disease: Secondary | ICD-10-CM | POA: Diagnosis not present

## 2020-03-22 DIAGNOSIS — I129 Hypertensive chronic kidney disease with stage 1 through stage 4 chronic kidney disease, or unspecified chronic kidney disease: Secondary | ICD-10-CM | POA: Diagnosis not present

## 2020-03-22 DIAGNOSIS — N1832 Chronic kidney disease, stage 3b: Secondary | ICD-10-CM | POA: Diagnosis not present

## 2020-03-22 DIAGNOSIS — E11621 Type 2 diabetes mellitus with foot ulcer: Secondary | ICD-10-CM | POA: Diagnosis not present

## 2020-03-22 DIAGNOSIS — L97511 Non-pressure chronic ulcer of other part of right foot limited to breakdown of skin: Secondary | ICD-10-CM | POA: Diagnosis not present

## 2020-03-23 ENCOUNTER — Telehealth: Payer: Self-pay | Admitting: Internal Medicine

## 2020-03-23 ENCOUNTER — Encounter (HOSPITAL_COMMUNITY): Payer: Self-pay | Admitting: Nephrology

## 2020-03-23 DIAGNOSIS — Z79899 Other long term (current) drug therapy: Secondary | ICD-10-CM | POA: Diagnosis not present

## 2020-03-23 DIAGNOSIS — F419 Anxiety disorder, unspecified: Secondary | ICD-10-CM | POA: Diagnosis not present

## 2020-03-23 DIAGNOSIS — E11621 Type 2 diabetes mellitus with foot ulcer: Secondary | ICD-10-CM | POA: Diagnosis not present

## 2020-03-23 DIAGNOSIS — N189 Chronic kidney disease, unspecified: Secondary | ICD-10-CM | POA: Diagnosis not present

## 2020-03-23 DIAGNOSIS — I4892 Unspecified atrial flutter: Secondary | ICD-10-CM | POA: Diagnosis not present

## 2020-03-23 DIAGNOSIS — L97412 Non-pressure chronic ulcer of right heel and midfoot with fat layer exposed: Secondary | ICD-10-CM | POA: Diagnosis not present

## 2020-03-23 DIAGNOSIS — I129 Hypertensive chronic kidney disease with stage 1 through stage 4 chronic kidney disease, or unspecified chronic kidney disease: Secondary | ICD-10-CM | POA: Diagnosis not present

## 2020-03-23 DIAGNOSIS — L97512 Non-pressure chronic ulcer of other part of right foot with fat layer exposed: Secondary | ICD-10-CM | POA: Diagnosis not present

## 2020-03-23 DIAGNOSIS — E7800 Pure hypercholesterolemia, unspecified: Secondary | ICD-10-CM | POA: Diagnosis not present

## 2020-03-23 DIAGNOSIS — Z9049 Acquired absence of other specified parts of digestive tract: Secondary | ICD-10-CM | POA: Diagnosis not present

## 2020-03-23 DIAGNOSIS — Z7984 Long term (current) use of oral hypoglycemic drugs: Secondary | ICD-10-CM | POA: Diagnosis not present

## 2020-03-23 DIAGNOSIS — E1122 Type 2 diabetes mellitus with diabetic chronic kidney disease: Secondary | ICD-10-CM | POA: Diagnosis not present

## 2020-03-23 DIAGNOSIS — N400 Enlarged prostate without lower urinary tract symptoms: Secondary | ICD-10-CM | POA: Diagnosis not present

## 2020-03-23 DIAGNOSIS — Z89512 Acquired absence of left leg below knee: Secondary | ICD-10-CM | POA: Diagnosis not present

## 2020-03-23 DIAGNOSIS — M869 Osteomyelitis, unspecified: Secondary | ICD-10-CM | POA: Diagnosis not present

## 2020-03-23 LAB — SURGICAL PATHOLOGY

## 2020-03-23 NOTE — Telephone Encounter (Signed)
PATIENT CALLED ASKING IF HE STILL NEEDED HIS APPOINTMENT Tuesday.  HE DID NOT HAVE HIS PROCEDURE AND WAS QUESTIONING IF YOU STILL WANTED HIM TO COME IN

## 2020-03-26 NOTE — Telephone Encounter (Signed)
Patient called back. His appt has been cancelled for tomorrow. He did not have his procedure done and he is seeing Dr. Theador Hawthorne and Dr. Raliegh Ip still for his ongoing issues. When should patient be rescheduled? thanks

## 2020-03-27 ENCOUNTER — Ambulatory Visit: Payer: Medicare Other | Admitting: Nurse Practitioner

## 2020-03-27 NOTE — Telephone Encounter (Signed)
We can schedule a follow-up in 2 months. Call if he sees any further black stools, rectal bleeding, worsening weakness/fatigue, shortness of breath, lightheadedness, dizziness, passing out, nearly passing out. Low threshold to repeat CBC and/or iron studies in this setting with anemia.

## 2020-03-28 ENCOUNTER — Encounter: Payer: Self-pay | Admitting: Internal Medicine

## 2020-03-28 NOTE — Telephone Encounter (Signed)
LMOVM for pt. [name] [name] please schedule and mail letter

## 2020-03-28 NOTE — Telephone Encounter (Signed)
Scheduled patient and sent letter  

## 2020-03-29 ENCOUNTER — Other Ambulatory Visit: Payer: Self-pay

## 2020-03-29 ENCOUNTER — Inpatient Hospital Stay (HOSPITAL_COMMUNITY): Payer: Medicare Other | Attending: Hematology | Admitting: Hematology

## 2020-03-29 VITALS — BP 114/59 | HR 61 | Temp 96.8°F | Resp 18 | Wt 238.0 lb

## 2020-03-29 DIAGNOSIS — I129 Hypertensive chronic kidney disease with stage 1 through stage 4 chronic kidney disease, or unspecified chronic kidney disease: Secondary | ICD-10-CM | POA: Diagnosis not present

## 2020-03-29 DIAGNOSIS — E1122 Type 2 diabetes mellitus with diabetic chronic kidney disease: Secondary | ICD-10-CM | POA: Diagnosis not present

## 2020-03-29 DIAGNOSIS — L97511 Non-pressure chronic ulcer of other part of right foot limited to breakdown of skin: Secondary | ICD-10-CM | POA: Diagnosis not present

## 2020-03-29 DIAGNOSIS — D472 Monoclonal gammopathy: Secondary | ICD-10-CM | POA: Diagnosis not present

## 2020-03-29 DIAGNOSIS — N1832 Chronic kidney disease, stage 3b: Secondary | ICD-10-CM | POA: Diagnosis not present

## 2020-03-29 DIAGNOSIS — E1151 Type 2 diabetes mellitus with diabetic peripheral angiopathy without gangrene: Secondary | ICD-10-CM | POA: Diagnosis not present

## 2020-03-29 DIAGNOSIS — E11621 Type 2 diabetes mellitus with foot ulcer: Secondary | ICD-10-CM | POA: Diagnosis not present

## 2020-03-29 NOTE — Progress Notes (Signed)
[name] [name], [name] [name]   CLINIC:  Medical Oncology/Hematology  PCP:  [name] [name], [address] [place] [address]  [phone]  REASON FOR VISIT:  Follow-up for monoclonal gammopathy.  CURRENT THERAPY: Under work-up  INTERVAL HISTORY:  [name] [name], a 74 y.o. male, returns for routine follow-up for his monoclonal gammopathy. [name] [name] was last seen on 03/13/2020.  Today he reports appetite 100%.  Energy level 75%.  No new bone pains.  The kidney biopsy performed on 03/14/2020 showed severe arteriosclerosis with arterionephrosclerosis and focal segmental glomerulosclerosis. He reports that his father died young from kidney failure.   REVIEW OF SYSTEMS:  Review of Systems  Constitutional: Positive for fatigue (mild). Negative for appetite change.  Respiratory: Positive for cough.   Gastrointestinal: Positive for constipation (occasional) and diarrhea (occasional).  Skin: Positive for itching (knees, thighs, forearms).  Neurological: Positive for numbness (fingertips).  All other systems reviewed and are negative.   PAST MEDICAL/SURGICAL HISTORY:  Past Medical History:  Diagnosis Date  . Anemia   . Anxiety   . Cancer [facility])    Prostate- Stage I  . Critical lower limb ischemia   . Essential hypertension   . Gangrene of foot [facility]) March 2016   Left foot  . Hyperlipidemia   . Neuropathy   . Type 2 diabetes mellitus ([place])    Past Surgical History:  Procedure Laterality Date  . AMPUTATION Left 01/12/2015   Procedure: AMPUTATION BELOW KNEE;  Surgeon: Leandrew Koyanagi, MD;  Location: [facility];  Service: Orthopedics;  Laterality: Left;  . APPLICATION OF WOUND VAC Right 01/12/2015   Procedure: APPLICATION OF WOUND VAC;  Surgeon: Leandrew Koyanagi, MD;  Location: [facility];  Service: Orthopedics;  Laterality: Right;  . CARDIAC ELECTROPHYSIOLOGY STUDY AND ABLATION    . CHOLECYSTECTOMY    . ORIF HUMERUS FRACTURE      SOCIAL HISTORY:    Social History   Socioeconomic History  . Marital status: Single    Spouse name: Not on file  . Number of children: 0  . Years of education: Not on file  . Highest education level: Not on file  Occupational History  . Occupation: RETIRED  Tobacco Use  . Smoking status: Never Smoker  . Smokeless tobacco: Never Used  Vaping Use  . Vaping Use: Never used  Substance and Sexual Activity  . Alcohol use: Yes    Alcohol/week: 0.0 standard drinks    Comment: rare  . Drug use: No  . Sexual activity: Not Currently  Other Topics Concern  . Not on file  Social History Narrative  . Not on file   Social Determinants of Health   Financial Resource Strain: Medium Risk  . Difficulty of Paying Living Expenses: Somewhat hard  Food Insecurity: Food Insecurity Present  . Worried About Charity fundraiser in the Last Year: Sometimes true  . Ran Out of Food in the Last Year: Sometimes true  Transportation Needs: Unmet Transportation Needs  . Lack of Transportation (Medical): Yes  . Lack of Transportation (Non-Medical): Yes  Physical Activity: Inactive  . Days of Exercise per Week: 0 days  . Minutes of Exercise per Session: 0 min  Stress: No Stress Concern Present  . Feeling of Stress : Not at all  Social Connections: Socially Isolated  . Frequency of Communication with Friends and Family: More than three times a week  . Frequency of Social Gatherings with Friends and Family: Twice  a week  . Attends Religious Services: Never  . Active Member of Clubs or Organizations: No  . Attends Archivist Meetings: Never  . Marital Status: Never married  Intimate Partner Violence: Not At Risk  . Fear of Current or Ex-Partner: No  . Emotionally Abused: No  . Physically Abused: No  . Sexually Abused: No    FAMILY HISTORY:  Family History  Problem Relation Age of Onset  . Hypertension Father        and kidney failure  . Heart disease Father   . Heart attack Father   . Kidney disease  Father   . Alzheimer's disease Mother   . Heart attack Maternal Grandfather   . Heart attack Paternal Grandfather   . Colon cancer Paternal Aunt 28       s/p surgical rsxn; lived to 74 yrs old  . Hypertension Sister   . Hypercholesterolemia Sister   . Cervical cancer Sister   . Gastric cancer Neg Hx   . Esophageal cancer Neg Hx     CURRENT MEDICATIONS:  Current Outpatient Medications  Medication Sig Dispense Refill  . ALPRAZolam (XANAX) 1 MG tablet Take 1.5 mg by mouth at bedtime.    Marland Kitchen ascorbic acid (VITAMIN C) 1000 MG tablet Take 1,000 mg by mouth daily in the afternoon.     . Calcium Carbonate (CALCIUM 600 PO) Take 600 mg by mouth daily in the afternoon.    . finasteride (PROSCAR) 5 MG tablet Take 5 mg by mouth daily in the afternoon.     Marland Kitchen glimepiride (AMARYL) 4 MG tablet Take 4 mg by mouth daily in the afternoon.     Marland Kitchen losartan (COZAAR) 25 MG tablet Take 25 mg by mouth daily.    . metoprolol (LOPRESSOR) 100 MG tablet Take 100 mg by mouth 2 (two) times daily.    . Multiple Vitamins-Minerals (MULTIVITAMIN WITH MINERALS) tablet Take 1 tablet by mouth daily in the afternoon.     . Omega-3 Fatty Acids (FISH OIL) 1200 MG CAPS Take 1,200 mg by mouth daily in the afternoon.    . simvastatin (ZOCOR) 40 MG tablet Take 40 mg by mouth daily in the afternoon.     . tamsulosin (FLOMAX) 0.4 MG CAPS capsule Take 0.4 mg by mouth daily in the afternoon.     . vitamin E 400 UNIT capsule Take 400 Units by mouth daily.    . cadexomer iodine (IODOSORB) 0.9 % gel Apply 1 application topically daily as needed for wound care. (Patient not taking: Reported on 03/29/2020)    . furosemide (LASIX) 40 MG tablet Take 40 mg by mouth daily in the afternoon.  (Patient not taking: Reported on 03/29/2020)    . gabapentin (NEURONTIN) 300 MG capsule Take 600 mg by mouth 2 (two) times daily as needed (pain.).  (Patient not taking: Reported on 03/29/2020)    . hydrALAZINE (APRESOLINE) 100 MG tablet Take 100 mg by mouth in  the morning and at bedtime. (Patient not taking: Reported on 03/29/2020)     No current facility-administered medications for this visit.    ALLERGIES:  No Known Allergies  PHYSICAL EXAM:  Performance status (ECOG): 1 - Symptomatic but completely ambulatory  Vitals:   03/29/20 1522  BP: (!) 114/59  Pulse: 61  Resp: 18  Temp: (!) 96.8 F (36 C)  SpO2: 97%   Wt Readings from Last 3 Encounters:  03/29/20 238 lb (108 kg)  03/14/20 226 lb (102.5 kg)  03/13/20 226 lb  8 oz (102.7 kg)   Physical Exam Vitals reviewed.  Constitutional:      Appearance: Normal appearance. He is obese.  Cardiovascular:     Rate and Rhythm: Normal rate and regular rhythm.     Pulses: Normal pulses.     Heart sounds: Normal heart sounds.  Pulmonary:     Effort: Pulmonary effort is normal.     Breath sounds: Normal breath sounds.  Musculoskeletal:     Left Lower Extremity: Left leg is amputated above knee.  Neurological:     General: No focal deficit present.     Mental Status: He is alert and oriented to person, place, and time.  Psychiatric:        Mood and Affect: Mood normal.        Behavior: Behavior normal.     LABORATORY DATA:  I have reviewed the labs as listed.  CBC Latest Ref Rng & Units 03/14/2020 03/13/2020 01/27/2020  WBC 4.0 - 10.5 K/uL 9.6 8.7 6.2  Hemoglobin 13.0 - 17.0 g/dL 15.0 15.0 11.7(L)  Hematocrit 39 - 52 % 47.4 47.1 38.2(L)  Platelets 150 - 400 K/uL 228 260 256   CMP Latest Ref Rng & Units 03/13/2020 01/27/2020 12/19/2018  Glucose 70 - 99 mg/dL 138(H) 80 -  BUN 8 - 23 mg/dL 52(H) 42(H) 27(H)  Creatinine 0.61 - 1.24 mg/dL 2.22(H) 3.28(H) 1.22  Sodium 135 - 145 mmol/L 138 133(L) -  Potassium 3.5 - 5.1 mmol/L 3.7 4.2 -  Chloride 98 - 111 mmol/L 99 98 -  CO2 22 - 32 mmol/L 27 22 -  Calcium 8.9 - 10.3 mg/dL 9.3 9.1 -  Total Protein 6.5 - 8.1 g/dL 7.1 - -  Total Bilirubin 0.3 - 1.2 mg/dL 0.8 - -  Alkaline Phos 38 - 126 U/L 63 - -  AST 15 - 41 U/L 26 - -  ALT 0 - 44 U/L 26  - -      Component Value Date/Time   RBC 5.45 03/14/2020 0621   MCV 87.0 03/14/2020 0621   MCH 27.5 03/14/2020 0621   MCHC 31.6 03/14/2020 0621   RDW 24.4 (H) 03/14/2020 0621   LYMPHSABS 1.9 03/13/2020 1505   MONOABS 0.8 03/13/2020 1505   EOSABS 0.4 03/13/2020 1505   BASOSABS 0.1 03/13/2020 1505   Lab Results  Component Value Date   TOTALPROTELP 6.5 03/13/2020   TOTALPROTELP 6.4 03/13/2020   ALBUMINELP 3.7 03/13/2020   A1GS 0.2 03/13/2020   A2GS 0.8 03/13/2020   BETS 1.0 03/13/2020   GAMS 0.9 03/13/2020   MSPIKE Not Observed 03/13/2020   SPEI Comment 03/13/2020   Lab Results  Component Value Date   KPAFRELGTCHN 67.6 (H) 03/13/2020   LAMBDASER 20.4 03/13/2020   KAPLAMBRATIO 3.31 (H) 03/13/2020    DIAGNOSTIC IMAGING:  I have independently reviewed the scans and discussed with the patient. DG Bone Survey Met  Result Date: 03/14/2020 CLINICAL DATA:  Monoclonal gammopathy . EXAM: METASTATIC BONE SURVEY COMPARISON:  None. FINDINGS: Peripherally sclerotic circular defects within the distal right radius from previous plate fixation noted. Large cervical spine ventral flowing syndesmophytes are identified with maintenance of the disc spaces noted Spondylosis noted throughout the thoracic spine. Degenerative changes noted within both shoulder and both hip joints. Status post left lower extremity below the knee amputation. No lucent lesions of myeloma identified. IMPRESSION: 1. No lucent lesions of myeloma identified 2. Osseous within the cervical spine suggestive of diffuse idiopathic skeletal hyperostosis (DISH) 3. Thoracic and lumbar spine degenerative changes  noted. Electronically Signed   By: Kerby Moors M.D.   On: 03/14/2020 08:21   US BIOPSY (KIDNEY)  Result Date: 03/14/2020 INDICATION: Renal insufficiency, proteinuria, hypertension, diabetes and monoclonal gammopathy. The patient requires renal biopsy. EXAM: ULTRASOUND GUIDED CORE BIOPSY OF RIGHT KIDNEY MEDICATIONS: None.  ANESTHESIA/SEDATION: Fentanyl 50 mcg IV; Versed 1.0 mg IV Moderate Sedation Time:  15 minutes. The patient was continuously monitored during the procedure by the interventional radiology nurse under my direct supervision. PROCEDURE: The procedure, risks, benefits, and alternatives were explained to the patient. Questions regarding the procedure were encouraged and answered. The patient understands and consents to the procedure. A time-out was performed prior to initiating the procedure. Both kidneys were localized by ultrasound. The right flank region was prepped with chlorhexidine in a sterile fashion, and a sterile drape was applied covering the operative field. A sterile gown and sterile gloves were used for the procedure. Local anesthesia was provided with 1% Lidocaine. Under ultrasound guidance, a 15 gauge trocar needle was advanced to the margin of lower pole cortex of the right kidney. After confirming needle tip position, 2 separate coaxial 16 gauge core biopsy samples were obtained with an automated device. Samples were submitted in saline. A slurry of Gel-Foam pledgets in saline were then injected through the outer needle as the needle was retracted and removed. Additional ultrasound was performed. COMPLICATIONS: None immediate. FINDINGS: Both kidneys were visualized by ultrasound. There are some renal cysts bilaterally especially at the level of the left kidney which limited visualization of the renal cortex. Solid core biopsy samples were obtained from the lower pole of the right kidney. IMPRESSION: Ultrasound-guided core biopsy performed of lower pole cortex of the right kidney. Electronically Signed   By: Aletta Edouard M.D.   On: 03/14/2020 11:32     ASSESSMENT:  1.  Monoclonal gammopathy: -Patient seen at the request of Dr. Theador Hawthorne for further work-up of monoclonal gammopathy.  UPEP on 11/25/2019 showed poorly defined area of restricted protein mobility reactive to IgG and kappa antisera. -Labs  from 03/13/2020 shows M spike is not seen.  Free light chain ratio is elevated at 3.31.  Kappa light chain 67.6, lambda light chains 20.4.  Immunofixation was unremarkable. -Skeletal survey on 03/13/2020 with no lucent lesions of myeloma.  2.  CKD: -CKD stage IIIb since 2016 secondary to diabetes and hypertension.  He has nephrotic range proteinuria. -Ultrasound of the kidneys on 12/06/2019 shows bilateral renal cyst with no obstructing lesions.  Renal cortical thickness and renal echogenicity normal bilaterally. -Kidney biopsy on 03/14/2020 consistent with arteriosclerosis with arterionephrosclerosis and FSGS.   PLAN:  1.  Monoclonal gammopathy: -No clear evidence of monoclonal gammopathy although elevated free light chain ratio, slightly above that is permissible for degree of CKD. -I plan to repeat labs in 4 months to see if there is any evolution to myeloma process.  2.  CKD: -Creatinine on 03/13/2020 improved to 2.22.  3.  Normocytic anemia: -Hemoglobin is 15 on 03/14/2020.  4.  Peripheral neuropathy: -Numbness in the fingertips and right foot has been stable from diabetes.  5.  Prostate cancer: -He follows up with Dr. Brendia Sacks at [facility] and is under watchful waiting.   Orders placed this encounter:  No orders of the defined types were placed in this encounter.    [name] Jack, MD [place] [phone]   I, Milinda Antis, am acting as a scribe for Dr. Sanda Linger.  Kinnie Scales MD, have reviewed the above  documentation for accuracy and completeness, and I agree with the above.

## 2020-03-29 NOTE — Patient Instructions (Signed)
[facility] °Discharge Instructions ° °You were seen today by Dr. Katragadda. He went over your recent results. Dr. Katragadda will see you back in 4 months for labs and follow up. ° ° °Thank you for choosing [facility] to provide your oncology and hematology care.  To afford each patient quality time with our provider, please arrive at least 15 minutes before your scheduled appointment time.  ° °If you have a lab appointment with the Cancer Center please come in thru the Main Entrance and check in at the main information desk ° °You need to re-schedule your appointment should you arrive 10 or more minutes late.  We strive to give you quality time with our providers, and arriving late affects you and other patients whose appointments are after yours.  Also, if you no show three or more times for appointments you may be dismissed from the clinic at the providers discretion.     °Again, thank you for choosing [facility].  Our hope is that these requests will decrease the amount of time that you wait before being seen by our physicians.       °_____________________________________________________________ ° °Should you have questions after your visit to [facility], please contact our office at [phone] between the hours of 8:00 a.m. and 4:30 p.m.  Voicemails left after 4:00 p.m. will not be returned until the following business day.  For prescription refill requests, have your pharmacy contact our office and allow 72 hours.   ° °Cancer Center Support Programs:  ° °> Cancer Support Group  °2nd Tuesday of the month 1pm-2pm, Journey Room  ° ° °

## 2020-03-30 DIAGNOSIS — Z89512 Acquired absence of left leg below knee: Secondary | ICD-10-CM | POA: Diagnosis not present

## 2020-03-30 DIAGNOSIS — M869 Osteomyelitis, unspecified: Secondary | ICD-10-CM | POA: Diagnosis not present

## 2020-03-30 DIAGNOSIS — E1129 Type 2 diabetes mellitus with other diabetic kidney complication: Secondary | ICD-10-CM | POA: Diagnosis not present

## 2020-03-30 DIAGNOSIS — D472 Monoclonal gammopathy: Secondary | ICD-10-CM | POA: Diagnosis not present

## 2020-03-30 DIAGNOSIS — I129 Hypertensive chronic kidney disease with stage 1 through stage 4 chronic kidney disease, or unspecified chronic kidney disease: Secondary | ICD-10-CM | POA: Diagnosis not present

## 2020-03-30 DIAGNOSIS — R809 Proteinuria, unspecified: Secondary | ICD-10-CM | POA: Diagnosis not present

## 2020-03-30 DIAGNOSIS — E11621 Type 2 diabetes mellitus with foot ulcer: Secondary | ICD-10-CM | POA: Diagnosis not present

## 2020-03-30 DIAGNOSIS — E211 Secondary hyperparathyroidism, not elsewhere classified: Secondary | ICD-10-CM | POA: Diagnosis not present

## 2020-03-30 DIAGNOSIS — E1122 Type 2 diabetes mellitus with diabetic chronic kidney disease: Secondary | ICD-10-CM | POA: Diagnosis not present

## 2020-03-30 DIAGNOSIS — L97412 Non-pressure chronic ulcer of right heel and midfoot with fat layer exposed: Secondary | ICD-10-CM | POA: Diagnosis not present

## 2020-03-30 DIAGNOSIS — D638 Anemia in other chronic diseases classified elsewhere: Secondary | ICD-10-CM | POA: Diagnosis not present

## 2020-03-30 DIAGNOSIS — L97512 Non-pressure chronic ulcer of other part of right foot with fat layer exposed: Secondary | ICD-10-CM | POA: Diagnosis not present

## 2020-04-03 DIAGNOSIS — L97511 Non-pressure chronic ulcer of other part of right foot limited to breakdown of skin: Secondary | ICD-10-CM | POA: Diagnosis not present

## 2020-04-03 DIAGNOSIS — N1832 Chronic kidney disease, stage 3b: Secondary | ICD-10-CM | POA: Diagnosis not present

## 2020-04-03 DIAGNOSIS — E1122 Type 2 diabetes mellitus with diabetic chronic kidney disease: Secondary | ICD-10-CM | POA: Diagnosis not present

## 2020-04-03 DIAGNOSIS — I129 Hypertensive chronic kidney disease with stage 1 through stage 4 chronic kidney disease, or unspecified chronic kidney disease: Secondary | ICD-10-CM | POA: Diagnosis not present

## 2020-04-03 DIAGNOSIS — E11621 Type 2 diabetes mellitus with foot ulcer: Secondary | ICD-10-CM | POA: Diagnosis not present

## 2020-04-03 DIAGNOSIS — E1151 Type 2 diabetes mellitus with diabetic peripheral angiopathy without gangrene: Secondary | ICD-10-CM | POA: Diagnosis not present

## 2020-04-06 DIAGNOSIS — L97511 Non-pressure chronic ulcer of other part of right foot limited to breakdown of skin: Secondary | ICD-10-CM | POA: Diagnosis not present

## 2020-04-06 DIAGNOSIS — E11621 Type 2 diabetes mellitus with foot ulcer: Secondary | ICD-10-CM | POA: Diagnosis not present

## 2020-04-06 DIAGNOSIS — Z89512 Acquired absence of left leg below knee: Secondary | ICD-10-CM | POA: Diagnosis not present

## 2020-04-06 DIAGNOSIS — E1129 Type 2 diabetes mellitus with other diabetic kidney complication: Secondary | ICD-10-CM | POA: Diagnosis not present

## 2020-04-06 DIAGNOSIS — E1151 Type 2 diabetes mellitus with diabetic peripheral angiopathy without gangrene: Secondary | ICD-10-CM | POA: Diagnosis not present

## 2020-04-06 DIAGNOSIS — F419 Anxiety disorder, unspecified: Secondary | ICD-10-CM | POA: Diagnosis not present

## 2020-04-06 DIAGNOSIS — E1122 Type 2 diabetes mellitus with diabetic chronic kidney disease: Secondary | ICD-10-CM | POA: Diagnosis not present

## 2020-04-06 DIAGNOSIS — E7849 Other hyperlipidemia: Secondary | ICD-10-CM | POA: Diagnosis not present

## 2020-04-06 DIAGNOSIS — Z7984 Long term (current) use of oral hypoglycemic drugs: Secondary | ICD-10-CM | POA: Diagnosis not present

## 2020-04-06 DIAGNOSIS — E6609 Other obesity due to excess calories: Secondary | ICD-10-CM | POA: Diagnosis not present

## 2020-04-06 DIAGNOSIS — N1832 Chronic kidney disease, stage 3b: Secondary | ICD-10-CM | POA: Diagnosis not present

## 2020-04-06 DIAGNOSIS — N2581 Secondary hyperparathyroidism of renal origin: Secondary | ICD-10-CM | POA: Diagnosis not present

## 2020-04-06 DIAGNOSIS — D631 Anemia in chronic kidney disease: Secondary | ICD-10-CM | POA: Diagnosis not present

## 2020-04-06 DIAGNOSIS — I129 Hypertensive chronic kidney disease with stage 1 through stage 4 chronic kidney disease, or unspecified chronic kidney disease: Secondary | ICD-10-CM | POA: Diagnosis not present

## 2020-04-10 DIAGNOSIS — E1122 Type 2 diabetes mellitus with diabetic chronic kidney disease: Secondary | ICD-10-CM | POA: Diagnosis not present

## 2020-04-10 DIAGNOSIS — L97512 Non-pressure chronic ulcer of other part of right foot with fat layer exposed: Secondary | ICD-10-CM | POA: Diagnosis not present

## 2020-04-10 DIAGNOSIS — Z89512 Acquired absence of left leg below knee: Secondary | ICD-10-CM | POA: Diagnosis not present

## 2020-04-10 DIAGNOSIS — M869 Osteomyelitis, unspecified: Secondary | ICD-10-CM | POA: Diagnosis not present

## 2020-04-10 DIAGNOSIS — L97412 Non-pressure chronic ulcer of right heel and midfoot with fat layer exposed: Secondary | ICD-10-CM | POA: Diagnosis not present

## 2020-04-10 DIAGNOSIS — E11621 Type 2 diabetes mellitus with foot ulcer: Secondary | ICD-10-CM | POA: Diagnosis not present

## 2020-04-12 DIAGNOSIS — E1122 Type 2 diabetes mellitus with diabetic chronic kidney disease: Secondary | ICD-10-CM | POA: Diagnosis not present

## 2020-04-12 DIAGNOSIS — I129 Hypertensive chronic kidney disease with stage 1 through stage 4 chronic kidney disease, or unspecified chronic kidney disease: Secondary | ICD-10-CM | POA: Diagnosis not present

## 2020-04-12 DIAGNOSIS — L97511 Non-pressure chronic ulcer of other part of right foot limited to breakdown of skin: Secondary | ICD-10-CM | POA: Diagnosis not present

## 2020-04-12 DIAGNOSIS — E11621 Type 2 diabetes mellitus with foot ulcer: Secondary | ICD-10-CM | POA: Diagnosis not present

## 2020-04-12 DIAGNOSIS — E1151 Type 2 diabetes mellitus with diabetic peripheral angiopathy without gangrene: Secondary | ICD-10-CM | POA: Diagnosis not present

## 2020-04-12 DIAGNOSIS — N1832 Chronic kidney disease, stage 3b: Secondary | ICD-10-CM | POA: Diagnosis not present

## 2020-04-17 DIAGNOSIS — M869 Osteomyelitis, unspecified: Secondary | ICD-10-CM | POA: Diagnosis not present

## 2020-04-17 DIAGNOSIS — L97412 Non-pressure chronic ulcer of right heel and midfoot with fat layer exposed: Secondary | ICD-10-CM | POA: Diagnosis not present

## 2020-04-17 DIAGNOSIS — E11621 Type 2 diabetes mellitus with foot ulcer: Secondary | ICD-10-CM | POA: Diagnosis not present

## 2020-04-17 DIAGNOSIS — E1122 Type 2 diabetes mellitus with diabetic chronic kidney disease: Secondary | ICD-10-CM | POA: Diagnosis not present

## 2020-04-17 DIAGNOSIS — L97512 Non-pressure chronic ulcer of other part of right foot with fat layer exposed: Secondary | ICD-10-CM | POA: Diagnosis not present

## 2020-04-17 DIAGNOSIS — Z89512 Acquired absence of left leg below knee: Secondary | ICD-10-CM | POA: Diagnosis not present

## 2020-04-18 DIAGNOSIS — E119 Type 2 diabetes mellitus without complications: Secondary | ICD-10-CM | POA: Diagnosis not present

## 2020-04-19 DIAGNOSIS — E1151 Type 2 diabetes mellitus with diabetic peripheral angiopathy without gangrene: Secondary | ICD-10-CM | POA: Diagnosis not present

## 2020-04-19 DIAGNOSIS — L97511 Non-pressure chronic ulcer of other part of right foot limited to breakdown of skin: Secondary | ICD-10-CM | POA: Diagnosis not present

## 2020-04-19 DIAGNOSIS — E1122 Type 2 diabetes mellitus with diabetic chronic kidney disease: Secondary | ICD-10-CM | POA: Diagnosis not present

## 2020-04-19 DIAGNOSIS — N1832 Chronic kidney disease, stage 3b: Secondary | ICD-10-CM | POA: Diagnosis not present

## 2020-04-19 DIAGNOSIS — I129 Hypertensive chronic kidney disease with stage 1 through stage 4 chronic kidney disease, or unspecified chronic kidney disease: Secondary | ICD-10-CM | POA: Diagnosis not present

## 2020-04-19 DIAGNOSIS — E11621 Type 2 diabetes mellitus with foot ulcer: Secondary | ICD-10-CM | POA: Diagnosis not present

## 2020-04-24 DIAGNOSIS — R001 Bradycardia, unspecified: Secondary | ICD-10-CM | POA: Diagnosis not present

## 2020-04-24 DIAGNOSIS — E1122 Type 2 diabetes mellitus with diabetic chronic kidney disease: Secondary | ICD-10-CM | POA: Diagnosis not present

## 2020-04-24 DIAGNOSIS — M869 Osteomyelitis, unspecified: Secondary | ICD-10-CM | POA: Diagnosis not present

## 2020-04-24 DIAGNOSIS — E876 Hypokalemia: Secondary | ICD-10-CM | POA: Diagnosis not present

## 2020-04-24 DIAGNOSIS — Z89512 Acquired absence of left leg below knee: Secondary | ICD-10-CM | POA: Diagnosis not present

## 2020-04-24 DIAGNOSIS — E11621 Type 2 diabetes mellitus with foot ulcer: Secondary | ICD-10-CM | POA: Diagnosis not present

## 2020-04-24 DIAGNOSIS — I4891 Unspecified atrial fibrillation: Secondary | ICD-10-CM | POA: Diagnosis not present

## 2020-04-24 DIAGNOSIS — R531 Weakness: Secondary | ICD-10-CM | POA: Diagnosis not present

## 2020-04-24 DIAGNOSIS — L97412 Non-pressure chronic ulcer of right heel and midfoot with fat layer exposed: Secondary | ICD-10-CM | POA: Diagnosis not present

## 2020-04-24 DIAGNOSIS — I4811 Longstanding persistent atrial fibrillation: Secondary | ICD-10-CM | POA: Diagnosis not present

## 2020-04-26 DIAGNOSIS — E1151 Type 2 diabetes mellitus with diabetic peripheral angiopathy without gangrene: Secondary | ICD-10-CM | POA: Diagnosis not present

## 2020-04-26 DIAGNOSIS — E11621 Type 2 diabetes mellitus with foot ulcer: Secondary | ICD-10-CM | POA: Diagnosis not present

## 2020-04-26 DIAGNOSIS — E1122 Type 2 diabetes mellitus with diabetic chronic kidney disease: Secondary | ICD-10-CM | POA: Diagnosis not present

## 2020-04-26 DIAGNOSIS — N1832 Chronic kidney disease, stage 3b: Secondary | ICD-10-CM | POA: Diagnosis not present

## 2020-04-26 DIAGNOSIS — I129 Hypertensive chronic kidney disease with stage 1 through stage 4 chronic kidney disease, or unspecified chronic kidney disease: Secondary | ICD-10-CM | POA: Diagnosis not present

## 2020-04-26 DIAGNOSIS — L97511 Non-pressure chronic ulcer of other part of right foot limited to breakdown of skin: Secondary | ICD-10-CM | POA: Diagnosis not present

## 2020-04-27 DIAGNOSIS — E876 Hypokalemia: Secondary | ICD-10-CM | POA: Diagnosis not present

## 2020-04-27 DIAGNOSIS — Z9049 Acquired absence of other specified parts of digestive tract: Secondary | ICD-10-CM | POA: Diagnosis not present

## 2020-04-27 DIAGNOSIS — M869 Osteomyelitis, unspecified: Secondary | ICD-10-CM | POA: Diagnosis present

## 2020-04-27 DIAGNOSIS — Z20822 Contact with and (suspected) exposure to covid-19: Secondary | ICD-10-CM | POA: Diagnosis present

## 2020-04-27 DIAGNOSIS — R531 Weakness: Secondary | ICD-10-CM | POA: Diagnosis not present

## 2020-04-27 DIAGNOSIS — N400 Enlarged prostate without lower urinary tract symptoms: Secondary | ICD-10-CM | POA: Diagnosis present

## 2020-04-27 DIAGNOSIS — N189 Chronic kidney disease, unspecified: Secondary | ICD-10-CM | POA: Diagnosis present

## 2020-04-27 DIAGNOSIS — F419 Anxiety disorder, unspecified: Secondary | ICD-10-CM | POA: Diagnosis present

## 2020-04-27 DIAGNOSIS — L97513 Non-pressure chronic ulcer of other part of right foot with necrosis of muscle: Secondary | ICD-10-CM | POA: Diagnosis not present

## 2020-04-27 DIAGNOSIS — L97512 Non-pressure chronic ulcer of other part of right foot with fat layer exposed: Secondary | ICD-10-CM | POA: Diagnosis present

## 2020-04-27 DIAGNOSIS — I4891 Unspecified atrial fibrillation: Secondary | ICD-10-CM | POA: Diagnosis not present

## 2020-04-27 DIAGNOSIS — R0902 Hypoxemia: Secondary | ICD-10-CM | POA: Diagnosis not present

## 2020-04-27 DIAGNOSIS — I129 Hypertensive chronic kidney disease with stage 1 through stage 4 chronic kidney disease, or unspecified chronic kidney disease: Secondary | ICD-10-CM | POA: Diagnosis present

## 2020-04-27 DIAGNOSIS — L97412 Non-pressure chronic ulcer of right heel and midfoot with fat layer exposed: Secondary | ICD-10-CM | POA: Diagnosis not present

## 2020-04-27 DIAGNOSIS — E162 Hypoglycemia, unspecified: Secondary | ICD-10-CM | POA: Diagnosis not present

## 2020-04-27 DIAGNOSIS — Z8546 Personal history of malignant neoplasm of prostate: Secondary | ICD-10-CM | POA: Diagnosis not present

## 2020-04-27 DIAGNOSIS — R001 Bradycardia, unspecified: Secondary | ICD-10-CM | POA: Diagnosis not present

## 2020-04-27 DIAGNOSIS — E7800 Pure hypercholesterolemia, unspecified: Secondary | ICD-10-CM | POA: Diagnosis present

## 2020-04-27 DIAGNOSIS — M86171 Other acute osteomyelitis, right ankle and foot: Secondary | ICD-10-CM | POA: Diagnosis not present

## 2020-04-27 DIAGNOSIS — M19071 Primary osteoarthritis, right ankle and foot: Secondary | ICD-10-CM | POA: Diagnosis not present

## 2020-04-27 DIAGNOSIS — Z7984 Long term (current) use of oral hypoglycemic drugs: Secondary | ICD-10-CM | POA: Diagnosis not present

## 2020-04-27 DIAGNOSIS — E1122 Type 2 diabetes mellitus with diabetic chronic kidney disease: Secondary | ICD-10-CM | POA: Diagnosis present

## 2020-04-27 DIAGNOSIS — R5383 Other fatigue: Secondary | ICD-10-CM | POA: Insufficient documentation

## 2020-04-27 DIAGNOSIS — E161 Other hypoglycemia: Secondary | ICD-10-CM | POA: Diagnosis not present

## 2020-04-27 DIAGNOSIS — E11621 Type 2 diabetes mellitus with foot ulcer: Secondary | ICD-10-CM | POA: Diagnosis not present

## 2020-04-27 DIAGNOSIS — M868X7 Other osteomyelitis, ankle and foot: Secondary | ICD-10-CM | POA: Diagnosis not present

## 2020-04-27 DIAGNOSIS — M7989 Other specified soft tissue disorders: Secondary | ICD-10-CM | POA: Diagnosis not present

## 2020-04-27 DIAGNOSIS — Z89512 Acquired absence of left leg below knee: Secondary | ICD-10-CM | POA: Diagnosis not present

## 2020-04-27 DIAGNOSIS — I4811 Longstanding persistent atrial fibrillation: Secondary | ICD-10-CM | POA: Diagnosis present

## 2020-04-27 DIAGNOSIS — E1169 Type 2 diabetes mellitus with other specified complication: Secondary | ICD-10-CM | POA: Diagnosis present

## 2020-05-06 DIAGNOSIS — Z7984 Long term (current) use of oral hypoglycemic drugs: Secondary | ICD-10-CM | POA: Diagnosis not present

## 2020-05-06 DIAGNOSIS — I129 Hypertensive chronic kidney disease with stage 1 through stage 4 chronic kidney disease, or unspecified chronic kidney disease: Secondary | ICD-10-CM | POA: Diagnosis not present

## 2020-05-06 DIAGNOSIS — L97511 Non-pressure chronic ulcer of other part of right foot limited to breakdown of skin: Secondary | ICD-10-CM | POA: Diagnosis not present

## 2020-05-06 DIAGNOSIS — E6609 Other obesity due to excess calories: Secondary | ICD-10-CM | POA: Diagnosis not present

## 2020-05-06 DIAGNOSIS — E1151 Type 2 diabetes mellitus with diabetic peripheral angiopathy without gangrene: Secondary | ICD-10-CM | POA: Diagnosis not present

## 2020-05-06 DIAGNOSIS — Z89512 Acquired absence of left leg below knee: Secondary | ICD-10-CM | POA: Diagnosis not present

## 2020-05-06 DIAGNOSIS — N1832 Chronic kidney disease, stage 3b: Secondary | ICD-10-CM | POA: Diagnosis not present

## 2020-05-06 DIAGNOSIS — E1129 Type 2 diabetes mellitus with other diabetic kidney complication: Secondary | ICD-10-CM | POA: Diagnosis not present

## 2020-05-06 DIAGNOSIS — E7849 Other hyperlipidemia: Secondary | ICD-10-CM | POA: Diagnosis not present

## 2020-05-06 DIAGNOSIS — E1122 Type 2 diabetes mellitus with diabetic chronic kidney disease: Secondary | ICD-10-CM | POA: Diagnosis not present

## 2020-05-06 DIAGNOSIS — F419 Anxiety disorder, unspecified: Secondary | ICD-10-CM | POA: Diagnosis not present

## 2020-05-06 DIAGNOSIS — N2581 Secondary hyperparathyroidism of renal origin: Secondary | ICD-10-CM | POA: Diagnosis not present

## 2020-05-06 DIAGNOSIS — E11621 Type 2 diabetes mellitus with foot ulcer: Secondary | ICD-10-CM | POA: Diagnosis not present

## 2020-05-06 DIAGNOSIS — D631 Anemia in chronic kidney disease: Secondary | ICD-10-CM | POA: Diagnosis not present

## 2020-05-07 DIAGNOSIS — L97509 Non-pressure chronic ulcer of other part of unspecified foot with unspecified severity: Secondary | ICD-10-CM | POA: Diagnosis not present

## 2020-05-07 DIAGNOSIS — Z299 Encounter for prophylactic measures, unspecified: Secondary | ICD-10-CM | POA: Diagnosis not present

## 2020-05-07 DIAGNOSIS — E1165 Type 2 diabetes mellitus with hyperglycemia: Secondary | ICD-10-CM | POA: Diagnosis not present

## 2020-05-07 DIAGNOSIS — E11621 Type 2 diabetes mellitus with foot ulcer: Secondary | ICD-10-CM | POA: Diagnosis not present

## 2020-05-07 DIAGNOSIS — Z6834 Body mass index (BMI) 34.0-34.9, adult: Secondary | ICD-10-CM | POA: Diagnosis not present

## 2020-05-07 DIAGNOSIS — I10 Essential (primary) hypertension: Secondary | ICD-10-CM | POA: Diagnosis not present

## 2020-05-10 DIAGNOSIS — E1151 Type 2 diabetes mellitus with diabetic peripheral angiopathy without gangrene: Secondary | ICD-10-CM | POA: Diagnosis not present

## 2020-05-10 DIAGNOSIS — L97511 Non-pressure chronic ulcer of other part of right foot limited to breakdown of skin: Secondary | ICD-10-CM | POA: Diagnosis not present

## 2020-05-10 DIAGNOSIS — E1122 Type 2 diabetes mellitus with diabetic chronic kidney disease: Secondary | ICD-10-CM | POA: Diagnosis not present

## 2020-05-10 DIAGNOSIS — E11621 Type 2 diabetes mellitus with foot ulcer: Secondary | ICD-10-CM | POA: Diagnosis not present

## 2020-05-10 DIAGNOSIS — I129 Hypertensive chronic kidney disease with stage 1 through stage 4 chronic kidney disease, or unspecified chronic kidney disease: Secondary | ICD-10-CM | POA: Diagnosis not present

## 2020-05-10 DIAGNOSIS — N1832 Chronic kidney disease, stage 3b: Secondary | ICD-10-CM | POA: Diagnosis not present

## 2020-05-18 DIAGNOSIS — E119 Type 2 diabetes mellitus without complications: Secondary | ICD-10-CM | POA: Diagnosis not present

## 2020-05-22 DIAGNOSIS — N189 Chronic kidney disease, unspecified: Secondary | ICD-10-CM | POA: Diagnosis not present

## 2020-05-22 DIAGNOSIS — S81811A Laceration without foreign body, right lower leg, initial encounter: Secondary | ICD-10-CM | POA: Diagnosis not present

## 2020-05-22 DIAGNOSIS — Z7984 Long term (current) use of oral hypoglycemic drugs: Secondary | ICD-10-CM | POA: Diagnosis not present

## 2020-05-22 DIAGNOSIS — E1122 Type 2 diabetes mellitus with diabetic chronic kidney disease: Secondary | ICD-10-CM | POA: Diagnosis not present

## 2020-05-22 DIAGNOSIS — Z89512 Acquired absence of left leg below knee: Secondary | ICD-10-CM | POA: Diagnosis not present

## 2020-05-22 DIAGNOSIS — L97811 Non-pressure chronic ulcer of other part of right lower leg limited to breakdown of skin: Secondary | ICD-10-CM | POA: Diagnosis not present

## 2020-05-22 DIAGNOSIS — E11621 Type 2 diabetes mellitus with foot ulcer: Secondary | ICD-10-CM | POA: Diagnosis not present

## 2020-05-22 DIAGNOSIS — Z9049 Acquired absence of other specified parts of digestive tract: Secondary | ICD-10-CM | POA: Diagnosis not present

## 2020-05-22 DIAGNOSIS — Z8546 Personal history of malignant neoplasm of prostate: Secondary | ICD-10-CM | POA: Diagnosis not present

## 2020-05-22 DIAGNOSIS — Z792 Long term (current) use of antibiotics: Secondary | ICD-10-CM | POA: Diagnosis not present

## 2020-05-22 DIAGNOSIS — Z79899 Other long term (current) drug therapy: Secondary | ICD-10-CM | POA: Diagnosis not present

## 2020-05-22 DIAGNOSIS — I129 Hypertensive chronic kidney disease with stage 1 through stage 4 chronic kidney disease, or unspecified chronic kidney disease: Secondary | ICD-10-CM | POA: Diagnosis not present

## 2020-05-22 DIAGNOSIS — L97519 Non-pressure chronic ulcer of other part of right foot with unspecified severity: Secondary | ICD-10-CM | POA: Diagnosis not present

## 2020-05-25 DIAGNOSIS — E1151 Type 2 diabetes mellitus with diabetic peripheral angiopathy without gangrene: Secondary | ICD-10-CM | POA: Diagnosis not present

## 2020-05-25 DIAGNOSIS — E1122 Type 2 diabetes mellitus with diabetic chronic kidney disease: Secondary | ICD-10-CM | POA: Diagnosis not present

## 2020-05-25 DIAGNOSIS — I129 Hypertensive chronic kidney disease with stage 1 through stage 4 chronic kidney disease, or unspecified chronic kidney disease: Secondary | ICD-10-CM | POA: Diagnosis not present

## 2020-05-25 DIAGNOSIS — N1832 Chronic kidney disease, stage 3b: Secondary | ICD-10-CM | POA: Diagnosis not present

## 2020-05-25 DIAGNOSIS — L97511 Non-pressure chronic ulcer of other part of right foot limited to breakdown of skin: Secondary | ICD-10-CM | POA: Diagnosis not present

## 2020-05-25 DIAGNOSIS — E11621 Type 2 diabetes mellitus with foot ulcer: Secondary | ICD-10-CM | POA: Diagnosis not present

## 2020-05-27 DIAGNOSIS — S81811A Laceration without foreign body, right lower leg, initial encounter: Secondary | ICD-10-CM | POA: Insufficient documentation

## 2020-05-28 ENCOUNTER — Ambulatory Visit: Payer: Medicare Other | Admitting: Gastroenterology

## 2020-05-29 DIAGNOSIS — L97811 Non-pressure chronic ulcer of other part of right lower leg limited to breakdown of skin: Secondary | ICD-10-CM | POA: Diagnosis not present

## 2020-05-29 DIAGNOSIS — S81811A Laceration without foreign body, right lower leg, initial encounter: Secondary | ICD-10-CM | POA: Diagnosis not present

## 2020-05-29 DIAGNOSIS — E11621 Type 2 diabetes mellitus with foot ulcer: Secondary | ICD-10-CM | POA: Diagnosis not present

## 2020-05-29 DIAGNOSIS — I129 Hypertensive chronic kidney disease with stage 1 through stage 4 chronic kidney disease, or unspecified chronic kidney disease: Secondary | ICD-10-CM | POA: Diagnosis not present

## 2020-05-29 DIAGNOSIS — S81811D Laceration without foreign body, right lower leg, subsequent encounter: Secondary | ICD-10-CM | POA: Diagnosis not present

## 2020-05-29 DIAGNOSIS — E1122 Type 2 diabetes mellitus with diabetic chronic kidney disease: Secondary | ICD-10-CM | POA: Diagnosis not present

## 2020-05-29 DIAGNOSIS — L97519 Non-pressure chronic ulcer of other part of right foot with unspecified severity: Secondary | ICD-10-CM | POA: Diagnosis not present

## 2020-06-01 DIAGNOSIS — E11621 Type 2 diabetes mellitus with foot ulcer: Secondary | ICD-10-CM | POA: Diagnosis not present

## 2020-06-01 DIAGNOSIS — L97511 Non-pressure chronic ulcer of other part of right foot limited to breakdown of skin: Secondary | ICD-10-CM | POA: Diagnosis not present

## 2020-06-01 DIAGNOSIS — N1832 Chronic kidney disease, stage 3b: Secondary | ICD-10-CM | POA: Diagnosis not present

## 2020-06-01 DIAGNOSIS — E1122 Type 2 diabetes mellitus with diabetic chronic kidney disease: Secondary | ICD-10-CM | POA: Diagnosis not present

## 2020-06-01 DIAGNOSIS — I129 Hypertensive chronic kidney disease with stage 1 through stage 4 chronic kidney disease, or unspecified chronic kidney disease: Secondary | ICD-10-CM | POA: Diagnosis not present

## 2020-06-01 DIAGNOSIS — E1151 Type 2 diabetes mellitus with diabetic peripheral angiopathy without gangrene: Secondary | ICD-10-CM | POA: Diagnosis not present

## 2020-06-05 DIAGNOSIS — M869 Osteomyelitis, unspecified: Secondary | ICD-10-CM | POA: Diagnosis not present

## 2020-06-05 DIAGNOSIS — E7849 Other hyperlipidemia: Secondary | ICD-10-CM | POA: Diagnosis not present

## 2020-06-05 DIAGNOSIS — E1151 Type 2 diabetes mellitus with diabetic peripheral angiopathy without gangrene: Secondary | ICD-10-CM | POA: Diagnosis not present

## 2020-06-05 DIAGNOSIS — L97511 Non-pressure chronic ulcer of other part of right foot limited to breakdown of skin: Secondary | ICD-10-CM | POA: Diagnosis not present

## 2020-06-05 DIAGNOSIS — E11621 Type 2 diabetes mellitus with foot ulcer: Secondary | ICD-10-CM | POA: Diagnosis not present

## 2020-06-05 DIAGNOSIS — F419 Anxiety disorder, unspecified: Secondary | ICD-10-CM | POA: Diagnosis not present

## 2020-06-05 DIAGNOSIS — Z7984 Long term (current) use of oral hypoglycemic drugs: Secondary | ICD-10-CM | POA: Diagnosis not present

## 2020-06-05 DIAGNOSIS — E1169 Type 2 diabetes mellitus with other specified complication: Secondary | ICD-10-CM | POA: Diagnosis not present

## 2020-06-05 DIAGNOSIS — I129 Hypertensive chronic kidney disease with stage 1 through stage 4 chronic kidney disease, or unspecified chronic kidney disease: Secondary | ICD-10-CM | POA: Diagnosis not present

## 2020-06-05 DIAGNOSIS — D631 Anemia in chronic kidney disease: Secondary | ICD-10-CM | POA: Diagnosis not present

## 2020-06-05 DIAGNOSIS — Z89512 Acquired absence of left leg below knee: Secondary | ICD-10-CM | POA: Diagnosis not present

## 2020-06-05 DIAGNOSIS — Z89411 Acquired absence of right great toe: Secondary | ICD-10-CM | POA: Diagnosis not present

## 2020-06-05 DIAGNOSIS — N2581 Secondary hyperparathyroidism of renal origin: Secondary | ICD-10-CM | POA: Diagnosis not present

## 2020-06-05 DIAGNOSIS — E1129 Type 2 diabetes mellitus with other diabetic kidney complication: Secondary | ICD-10-CM | POA: Diagnosis not present

## 2020-06-05 DIAGNOSIS — E6609 Other obesity due to excess calories: Secondary | ICD-10-CM | POA: Diagnosis not present

## 2020-06-05 DIAGNOSIS — N1832 Chronic kidney disease, stage 3b: Secondary | ICD-10-CM | POA: Diagnosis not present

## 2020-06-05 DIAGNOSIS — E1122 Type 2 diabetes mellitus with diabetic chronic kidney disease: Secondary | ICD-10-CM | POA: Diagnosis not present

## 2020-06-07 DIAGNOSIS — L97511 Non-pressure chronic ulcer of other part of right foot limited to breakdown of skin: Secondary | ICD-10-CM | POA: Diagnosis not present

## 2020-06-07 DIAGNOSIS — M869 Osteomyelitis, unspecified: Secondary | ICD-10-CM | POA: Diagnosis not present

## 2020-06-07 DIAGNOSIS — I129 Hypertensive chronic kidney disease with stage 1 through stage 4 chronic kidney disease, or unspecified chronic kidney disease: Secondary | ICD-10-CM | POA: Diagnosis not present

## 2020-06-07 DIAGNOSIS — E1151 Type 2 diabetes mellitus with diabetic peripheral angiopathy without gangrene: Secondary | ICD-10-CM | POA: Diagnosis not present

## 2020-06-07 DIAGNOSIS — E11621 Type 2 diabetes mellitus with foot ulcer: Secondary | ICD-10-CM | POA: Diagnosis not present

## 2020-06-07 DIAGNOSIS — E1169 Type 2 diabetes mellitus with other specified complication: Secondary | ICD-10-CM | POA: Diagnosis not present

## 2020-06-11 DIAGNOSIS — D638 Anemia in other chronic diseases classified elsewhere: Secondary | ICD-10-CM | POA: Diagnosis not present

## 2020-06-11 DIAGNOSIS — I129 Hypertensive chronic kidney disease with stage 1 through stage 4 chronic kidney disease, or unspecified chronic kidney disease: Secondary | ICD-10-CM | POA: Diagnosis not present

## 2020-06-11 DIAGNOSIS — R809 Proteinuria, unspecified: Secondary | ICD-10-CM | POA: Diagnosis not present

## 2020-06-11 DIAGNOSIS — E1129 Type 2 diabetes mellitus with other diabetic kidney complication: Secondary | ICD-10-CM | POA: Diagnosis not present

## 2020-06-11 DIAGNOSIS — E211 Secondary hyperparathyroidism, not elsewhere classified: Secondary | ICD-10-CM | POA: Diagnosis not present

## 2020-06-11 DIAGNOSIS — D472 Monoclonal gammopathy: Secondary | ICD-10-CM | POA: Diagnosis not present

## 2020-06-12 DIAGNOSIS — L97519 Non-pressure chronic ulcer of other part of right foot with unspecified severity: Secondary | ICD-10-CM | POA: Diagnosis not present

## 2020-06-12 DIAGNOSIS — L97811 Non-pressure chronic ulcer of other part of right lower leg limited to breakdown of skin: Secondary | ICD-10-CM | POA: Diagnosis not present

## 2020-06-12 DIAGNOSIS — E11621 Type 2 diabetes mellitus with foot ulcer: Secondary | ICD-10-CM | POA: Diagnosis not present

## 2020-06-12 DIAGNOSIS — I129 Hypertensive chronic kidney disease with stage 1 through stage 4 chronic kidney disease, or unspecified chronic kidney disease: Secondary | ICD-10-CM | POA: Diagnosis not present

## 2020-06-12 DIAGNOSIS — E1122 Type 2 diabetes mellitus with diabetic chronic kidney disease: Secondary | ICD-10-CM | POA: Diagnosis not present

## 2020-06-12 DIAGNOSIS — L97412 Non-pressure chronic ulcer of right heel and midfoot with fat layer exposed: Secondary | ICD-10-CM | POA: Diagnosis not present

## 2020-06-12 DIAGNOSIS — S81811A Laceration without foreign body, right lower leg, initial encounter: Secondary | ICD-10-CM | POA: Diagnosis not present

## 2020-06-19 DIAGNOSIS — E119 Type 2 diabetes mellitus without complications: Secondary | ICD-10-CM | POA: Diagnosis not present

## 2020-06-20 DIAGNOSIS — N041 Nephrotic syndrome with focal and segmental glomerular lesions: Secondary | ICD-10-CM | POA: Diagnosis not present

## 2020-06-20 DIAGNOSIS — D638 Anemia in other chronic diseases classified elsewhere: Secondary | ICD-10-CM | POA: Diagnosis not present

## 2020-06-20 DIAGNOSIS — I129 Hypertensive chronic kidney disease with stage 1 through stage 4 chronic kidney disease, or unspecified chronic kidney disease: Secondary | ICD-10-CM | POA: Diagnosis not present

## 2020-06-20 DIAGNOSIS — R809 Proteinuria, unspecified: Secondary | ICD-10-CM | POA: Diagnosis not present

## 2020-06-20 DIAGNOSIS — E1129 Type 2 diabetes mellitus with other diabetic kidney complication: Secondary | ICD-10-CM | POA: Diagnosis not present

## 2020-06-20 DIAGNOSIS — D472 Monoclonal gammopathy: Secondary | ICD-10-CM | POA: Diagnosis not present

## 2020-07-12 DIAGNOSIS — Z23 Encounter for immunization: Secondary | ICD-10-CM | POA: Diagnosis not present

## 2020-07-13 DIAGNOSIS — Z0000 Encounter for general adult medical examination without abnormal findings: Secondary | ICD-10-CM | POA: Diagnosis not present

## 2020-07-13 DIAGNOSIS — I4892 Unspecified atrial flutter: Secondary | ICD-10-CM | POA: Diagnosis not present

## 2020-07-13 DIAGNOSIS — Z1339 Encounter for screening examination for other mental health and behavioral disorders: Secondary | ICD-10-CM | POA: Diagnosis not present

## 2020-07-13 DIAGNOSIS — Z299 Encounter for prophylactic measures, unspecified: Secondary | ICD-10-CM | POA: Diagnosis not present

## 2020-07-13 DIAGNOSIS — Z6835 Body mass index (BMI) 35.0-35.9, adult: Secondary | ICD-10-CM | POA: Diagnosis not present

## 2020-07-13 DIAGNOSIS — Z7189 Other specified counseling: Secondary | ICD-10-CM | POA: Diagnosis not present

## 2020-07-13 DIAGNOSIS — Z1331 Encounter for screening for depression: Secondary | ICD-10-CM | POA: Diagnosis not present

## 2020-07-17 DIAGNOSIS — Z9049 Acquired absence of other specified parts of digestive tract: Secondary | ICD-10-CM | POA: Diagnosis not present

## 2020-07-17 DIAGNOSIS — S81811A Laceration without foreign body, right lower leg, initial encounter: Secondary | ICD-10-CM | POA: Diagnosis not present

## 2020-07-17 DIAGNOSIS — Z7984 Long term (current) use of oral hypoglycemic drugs: Secondary | ICD-10-CM | POA: Diagnosis not present

## 2020-07-17 DIAGNOSIS — E1122 Type 2 diabetes mellitus with diabetic chronic kidney disease: Secondary | ICD-10-CM | POA: Diagnosis not present

## 2020-07-17 DIAGNOSIS — Z79899 Other long term (current) drug therapy: Secondary | ICD-10-CM | POA: Diagnosis not present

## 2020-07-17 DIAGNOSIS — F419 Anxiety disorder, unspecified: Secondary | ICD-10-CM | POA: Diagnosis not present

## 2020-07-17 DIAGNOSIS — I4892 Unspecified atrial flutter: Secondary | ICD-10-CM | POA: Diagnosis not present

## 2020-07-17 DIAGNOSIS — N400 Enlarged prostate without lower urinary tract symptoms: Secondary | ICD-10-CM | POA: Diagnosis not present

## 2020-07-17 DIAGNOSIS — E11621 Type 2 diabetes mellitus with foot ulcer: Secondary | ICD-10-CM | POA: Diagnosis not present

## 2020-07-17 DIAGNOSIS — E7800 Pure hypercholesterolemia, unspecified: Secondary | ICD-10-CM | POA: Diagnosis not present

## 2020-07-17 DIAGNOSIS — I129 Hypertensive chronic kidney disease with stage 1 through stage 4 chronic kidney disease, or unspecified chronic kidney disease: Secondary | ICD-10-CM | POA: Diagnosis not present

## 2020-07-17 DIAGNOSIS — Z89512 Acquired absence of left leg below knee: Secondary | ICD-10-CM | POA: Diagnosis not present

## 2020-07-17 DIAGNOSIS — N189 Chronic kidney disease, unspecified: Secondary | ICD-10-CM | POA: Diagnosis not present

## 2020-07-17 DIAGNOSIS — L97412 Non-pressure chronic ulcer of right heel and midfoot with fat layer exposed: Secondary | ICD-10-CM | POA: Diagnosis not present

## 2020-07-19 ENCOUNTER — Ambulatory Visit: Payer: Medicare Other | Admitting: Nurse Practitioner

## 2020-07-19 DIAGNOSIS — E119 Type 2 diabetes mellitus without complications: Secondary | ICD-10-CM | POA: Diagnosis not present

## 2020-07-31 ENCOUNTER — Inpatient Hospital Stay (HOSPITAL_COMMUNITY): Payer: Medicare Other | Attending: Hematology

## 2020-07-31 ENCOUNTER — Other Ambulatory Visit: Payer: Self-pay

## 2020-07-31 DIAGNOSIS — C61 Malignant neoplasm of prostate: Secondary | ICD-10-CM | POA: Insufficient documentation

## 2020-07-31 DIAGNOSIS — R531 Weakness: Secondary | ICD-10-CM | POA: Insufficient documentation

## 2020-07-31 DIAGNOSIS — G629 Polyneuropathy, unspecified: Secondary | ICD-10-CM | POA: Insufficient documentation

## 2020-07-31 DIAGNOSIS — D649 Anemia, unspecified: Secondary | ICD-10-CM | POA: Diagnosis not present

## 2020-07-31 DIAGNOSIS — E119 Type 2 diabetes mellitus without complications: Secondary | ICD-10-CM | POA: Insufficient documentation

## 2020-07-31 DIAGNOSIS — F419 Anxiety disorder, unspecified: Secondary | ICD-10-CM | POA: Insufficient documentation

## 2020-07-31 DIAGNOSIS — I129 Hypertensive chronic kidney disease with stage 1 through stage 4 chronic kidney disease, or unspecified chronic kidney disease: Secondary | ICD-10-CM | POA: Insufficient documentation

## 2020-07-31 DIAGNOSIS — R200 Anesthesia of skin: Secondary | ICD-10-CM | POA: Diagnosis not present

## 2020-07-31 DIAGNOSIS — D472 Monoclonal gammopathy: Secondary | ICD-10-CM | POA: Insufficient documentation

## 2020-07-31 DIAGNOSIS — M2550 Pain in unspecified joint: Secondary | ICD-10-CM | POA: Insufficient documentation

## 2020-07-31 DIAGNOSIS — Z79899 Other long term (current) drug therapy: Secondary | ICD-10-CM | POA: Diagnosis not present

## 2020-07-31 DIAGNOSIS — N1832 Chronic kidney disease, stage 3b: Secondary | ICD-10-CM | POA: Diagnosis not present

## 2020-07-31 DIAGNOSIS — E785 Hyperlipidemia, unspecified: Secondary | ICD-10-CM | POA: Insufficient documentation

## 2020-07-31 LAB — CBC WITH DIFFERENTIAL/PLATELET
Abs Immature Granulocytes: 0.02 10*3/uL (ref 0.00–0.07)
Basophils Absolute: 0.1 10*3/uL (ref 0.0–0.1)
Basophils Relative: 1 %
Eosinophils Absolute: 0.5 10*3/uL (ref 0.0–0.5)
Eosinophils Relative: 5 %
HCT: 42.8 % (ref 39.0–52.0)
Hemoglobin: 14.2 g/dL (ref 13.0–17.0)
Immature Granulocytes: 0 %
Lymphocytes Relative: 20 %
Lymphs Abs: 2.1 10*3/uL (ref 0.7–4.0)
MCH: 30.7 pg (ref 26.0–34.0)
MCHC: 33.2 g/dL (ref 30.0–36.0)
MCV: 92.6 fL (ref 80.0–100.0)
Monocytes Absolute: 0.9 10*3/uL (ref 0.1–1.0)
Monocytes Relative: 9 %
Neutro Abs: 6.5 10*3/uL (ref 1.7–7.7)
Neutrophils Relative %: 65 %
Platelets: 216 10*3/uL (ref 150–400)
RBC: 4.62 MIL/uL (ref 4.22–5.81)
RDW: 15.1 % (ref 11.5–15.5)
WBC: 10.1 10*3/uL (ref 4.0–10.5)
nRBC: 0 % (ref 0.0–0.2)

## 2020-07-31 LAB — COMPREHENSIVE METABOLIC PANEL
ALT: 13 U/L (ref 0–44)
AST: 15 U/L (ref 15–41)
Albumin: 3.6 g/dL (ref 3.5–5.0)
Alkaline Phosphatase: 55 U/L (ref 38–126)
Anion gap: 9 (ref 5–15)
BUN: 23 mg/dL (ref 8–23)
CO2: 27 mmol/L (ref 22–32)
Calcium: 9.1 mg/dL (ref 8.9–10.3)
Chloride: 99 mmol/L (ref 98–111)
Creatinine, Ser: 1.82 mg/dL — ABNORMAL HIGH (ref 0.61–1.24)
GFR, Estimated: 36 mL/min — ABNORMAL LOW (ref >60)
Glucose, Bld: 93 mg/dL (ref 70–99)
Potassium: 3.8 mmol/L (ref 3.5–5.1)
Sodium: 135 mmol/L (ref 135–145)
Total Bilirubin: 0.8 mg/dL (ref 0.3–1.2)
Total Protein: 6.5 g/dL (ref 6.5–8.1)

## 2020-07-31 LAB — LACTATE DEHYDROGENASE: LDH: 98 U/L (ref 98–192)

## 2020-07-31 LAB — MAGNESIUM: Magnesium: 1.8 mg/dL (ref 1.7–2.4)

## 2020-08-01 LAB — KAPPA/LAMBDA LIGHT CHAINS
Kappa free light chain: 65.7 mg/L — ABNORMAL HIGH (ref 3.3–19.4)
Kappa, lambda light chain ratio: 2.38 — ABNORMAL HIGH (ref 0.26–1.65)
Lambda free light chains: 27.6 mg/L — ABNORMAL HIGH (ref 5.7–26.3)

## 2020-08-01 LAB — PROTEIN ELECTROPHORESIS, SERUM
A/G Ratio: 1.3 (ref 0.7–1.7)
Albumin ELP: 3.4 g/dL (ref 2.9–4.4)
Alpha-1-Globulin: 0.3 g/dL (ref 0.0–0.4)
Alpha-2-Globulin: 0.7 g/dL (ref 0.4–1.0)
Beta Globulin: 0.8 g/dL (ref 0.7–1.3)
Gamma Globulin: 0.9 g/dL (ref 0.4–1.8)
Globulin, Total: 2.7 g/dL (ref 2.2–3.9)
Total Protein ELP: 6.1 g/dL (ref 6.0–8.5)

## 2020-08-03 LAB — IMMUNOFIXATION ELECTROPHORESIS
IgA: 205 mg/dL (ref 61–437)
IgG (Immunoglobin G), Serum: 901 mg/dL (ref 603–1613)
IgM (Immunoglobulin M), Srm: 68 mg/dL (ref 15–143)
Total Protein ELP: 6.1 g/dL (ref 6.0–8.5)

## 2020-08-06 DIAGNOSIS — I4892 Unspecified atrial flutter: Secondary | ICD-10-CM | POA: Diagnosis not present

## 2020-08-06 DIAGNOSIS — Z299 Encounter for prophylactic measures, unspecified: Secondary | ICD-10-CM | POA: Diagnosis not present

## 2020-08-06 DIAGNOSIS — S88119A Complete traumatic amputation at level between knee and ankle, unspecified lower leg, initial encounter: Secondary | ICD-10-CM | POA: Diagnosis not present

## 2020-08-06 DIAGNOSIS — I10 Essential (primary) hypertension: Secondary | ICD-10-CM | POA: Diagnosis not present

## 2020-08-06 DIAGNOSIS — M25561 Pain in right knee: Secondary | ICD-10-CM | POA: Diagnosis not present

## 2020-08-07 ENCOUNTER — Other Ambulatory Visit: Payer: Self-pay

## 2020-08-07 ENCOUNTER — Inpatient Hospital Stay (HOSPITAL_BASED_OUTPATIENT_CLINIC_OR_DEPARTMENT_OTHER): Payer: Medicare Other | Admitting: Hematology

## 2020-08-07 VITALS — BP 121/75 | HR 68 | Temp 97.5°F | Resp 18 | Wt 236.5 lb

## 2020-08-07 DIAGNOSIS — I129 Hypertensive chronic kidney disease with stage 1 through stage 4 chronic kidney disease, or unspecified chronic kidney disease: Secondary | ICD-10-CM | POA: Diagnosis not present

## 2020-08-07 DIAGNOSIS — D472 Monoclonal gammopathy: Secondary | ICD-10-CM | POA: Diagnosis not present

## 2020-08-07 DIAGNOSIS — G629 Polyneuropathy, unspecified: Secondary | ICD-10-CM | POA: Diagnosis not present

## 2020-08-07 DIAGNOSIS — N1832 Chronic kidney disease, stage 3b: Secondary | ICD-10-CM | POA: Diagnosis not present

## 2020-08-07 DIAGNOSIS — M2550 Pain in unspecified joint: Secondary | ICD-10-CM | POA: Diagnosis not present

## 2020-08-07 DIAGNOSIS — C61 Malignant neoplasm of prostate: Secondary | ICD-10-CM | POA: Diagnosis not present

## 2020-08-07 NOTE — Progress Notes (Signed)
[place], [place] [address]   CLINIC:  Medical Oncology/Hematology  PCP:  Monico Blitz, MD [address] [place] [address]  [phone]  REASON FOR VISIT:  Follow-up for monoclonal gammopathy  PRIOR THERAPY: None  CURRENT THERAPY: Observation  INTERVAL HISTORY:  [name], a 74 y.o. male, returns for routine follow-up for his monoclonal gammopathy. [name] [name] was last seen on 03/29/2020.  Today he reports having an episode of right knee pain on 10/17 and had a prescription of oxycodone filled, but hasn't taken any yet. He denies having any new pains in his bones.   REVIEW OF SYSTEMS:  Review of Systems  Constitutional: Positive for fatigue (50%). Negative for appetite change.  Musculoskeletal: Positive for arthralgias (2/10 R knee pain). Negative for myalgias.  Neurological: Positive for numbness.  All other systems reviewed and are negative.   PAST MEDICAL/SURGICAL HISTORY:  Past Medical History:  Diagnosis Date   Anemia    Anxiety    Cancer ([place])    Prostate- Stage I   Critical lower limb ischemia    Essential hypertension    Gangrene of foot [facility]) March 2016   Left foot   Hyperlipidemia    Neuropathy    Type 2 diabetes mellitus ([place])    Past Surgical History:  Procedure Laterality Date   AMPUTATION Left 01/12/2015   Procedure: AMPUTATION BELOW KNEE;  Surgeon: Leandrew Koyanagi, MD;  Location: [place];  Service: Orthopedics;  Laterality: Left;   APPLICATION OF WOUND VAC Right 01/12/2015   Procedure: APPLICATION OF WOUND VAC;  Surgeon: Leandrew Koyanagi, MD;  Location: [place];  Service: Orthopedics;  Laterality: Right;   CARDIAC ELECTROPHYSIOLOGY STUDY AND ABLATION     CHOLECYSTECTOMY     ORIF HUMERUS FRACTURE      SOCIAL HISTORY:  Social History   Socioeconomic History   Marital status: Single    Spouse name: Not on file   Number of children: 0   Years of education: Not on file   Highest education  level: Not on file  Occupational History   Occupation: RETIRED  Tobacco Use   Smoking status: Never Smoker   Smokeless tobacco: Never Used  Vaping Use   Vaping Use: Never used  Substance and Sexual Activity   Alcohol use: Yes    Alcohol/week: 0.0 standard drinks    Comment: rare   Drug use: No   Sexual activity: Not Currently  Other Topics Concern   Not on file  Social History Narrative   Not on file   Social Determinants of Health   Financial Resource Strain: Medium Risk   Difficulty of Paying Living Expenses: Somewhat hard  Food Insecurity: Food Insecurity Present   Worried About Charity fundraiser in the Last Year: Sometimes true   Arboriculturist in the Last Year: Sometimes true  Transportation Needs: Public librarian (Medical): Yes   Lack of Transportation (Non-Medical): Yes  Physical Activity: Inactive   Days of Exercise per Week: 0 days   Minutes of Exercise per Session: 0 min  Stress: No Stress Concern Present   Feeling of Stress : Not at all  Social Connections: Socially Isolated   Frequency of Communication with Friends and Family: More than three times a week   Frequency of Social Gatherings with Friends and Family: Twice a week   Attends Religious Services: Never   Marine scientist or Organizations: No  Attends Banker Meetings: Never   Marital Status: Never married  Catering manager Violence: Not At Risk   Fear of Current or Ex-Partner: No   Emotionally Abused: No   Physically Abused: No   Sexually Abused: No    FAMILY HISTORY:  Family History  Problem Relation Age of Onset   Hypertension Father        and kidney failure   Heart disease Father    Heart attack Father    Kidney disease Father    Alzheimer's disease Mother    Heart attack Maternal Grandfather    Heart attack Paternal Grandfather    Colon cancer Paternal Aunt 7       s/p surgical rsxn; lived  to 74 yrs old   Hypertension Sister    Hypercholesterolemia Sister    Cervical cancer Sister    Gastric cancer Neg Hx    Esophageal cancer Neg Hx     CURRENT MEDICATIONS:  Current Outpatient Medications  Medication Sig Dispense Refill   ALPRAZolam (XANAX) 1 MG tablet Take 1.5 mg by mouth at bedtime.     ascorbic acid (VITAMIN C) 1000 MG tablet Take 1,000 mg by mouth daily in the afternoon.      cadexomer iodine (IODOSORB) 0.9 % gel Apply 1 application topically daily as needed for wound care. (Patient not taking: Reported on 03/29/2020)     Calcium Carbonate (CALCIUM 600 PO) Take 600 mg by mouth daily in the afternoon.     finasteride (PROSCAR) 5 MG tablet Take 5 mg by mouth daily in the afternoon.      furosemide (LASIX) 40 MG tablet Take 40 mg by mouth daily in the afternoon.  (Patient not taking: Reported on 03/29/2020)     gabapentin (NEURONTIN) 300 MG capsule Take 600 mg by mouth 2 (two) times daily as needed (pain.).  (Patient not taking: Reported on 03/29/2020)     glimepiride (AMARYL) 4 MG tablet Take 4 mg by mouth daily in the afternoon.      hydrALAZINE (APRESOLINE) 100 MG tablet Take 100 mg by mouth in the morning and at bedtime. (Patient not taking: Reported on 03/29/2020)     losartan (COZAAR) 25 MG tablet Take 25 mg by mouth daily.     metoprolol (LOPRESSOR) 100 MG tablet Take 100 mg by mouth 2 (two) times daily.     Multiple Vitamins-Minerals (MULTIVITAMIN WITH MINERALS) tablet Take 1 tablet by mouth daily in the afternoon.      Omega-3 Fatty Acids (FISH OIL) 1200 MG CAPS Take 1,200 mg by mouth daily in the afternoon.     simvastatin (ZOCOR) 40 MG tablet Take 40 mg by mouth daily in the afternoon.      tamsulosin (FLOMAX) 0.4 MG CAPS capsule Take 0.4 mg by mouth daily in the afternoon.      vitamin E 400 UNIT capsule Take 400 Units by mouth daily.     No current facility-administered medications for this visit.    ALLERGIES:  No Known  Allergies  PHYSICAL EXAM:  Performance status (ECOG): 1 - Symptomatic but completely ambulatory  Vitals:   08/07/20 1500  BP: 121/75  Pulse: 68  Resp: 18  Temp: (!) 97.5 F (36.4 C)  SpO2: 99%   Wt Readings from Last 3 Encounters:  08/07/20 236 lb 8 oz (107.3 kg)  03/29/20 238 lb (108 kg)  03/14/20 226 lb (102.5 kg)   Physical Exam Vitals reviewed.  Constitutional:      Appearance: Normal appearance.  He is obese.  Cardiovascular:     Rate and Rhythm: Normal rate and regular rhythm.     Pulses: Normal pulses.     Heart sounds: Normal heart sounds.  Pulmonary:     Effort: Pulmonary effort is normal.     Breath sounds: Normal breath sounds.  Musculoskeletal:     Left Lower Extremity: Left leg is amputated below knee.  Neurological:     General: No focal deficit present.     Mental Status: He is alert and oriented to person, place, and time.  Psychiatric:        Mood and Affect: Mood normal.        Behavior: Behavior normal.     LABORATORY DATA:  I have reviewed the labs as listed.  CBC Latest Ref Rng & Units 07/31/2020 03/14/2020 03/13/2020  WBC 4.0 - 10.5 K/uL 10.1 9.6 8.7  Hemoglobin 13.0 - 17.0 g/dL 14.2 15.0 15.0  Hematocrit 39 - 52 % 42.8 47.4 47.1  Platelets 150 - 400 K/uL 216 228 260   CMP Latest Ref Rng & Units 07/31/2020 03/13/2020 01/27/2020  Glucose 70 - 99 mg/dL 93 138(H) 80  BUN 8 - 23 mg/dL 23 52(H) 42(H)  Creatinine 0.61 - 1.24 mg/dL 1.82(H) 2.22(H) 3.28(H)  Sodium 135 - 145 mmol/L 135 138 133(L)  Potassium 3.5 - 5.1 mmol/L 3.8 3.7 4.2  Chloride 98 - 111 mmol/L 99 99 98  CO2 22 - 32 mmol/L _0 Calcium 8.9 - 10.3 mg/dL 9.1 9.3 9.1  Total Protein 6.5 - 8.1 g/dL 6.5 7.1 -  Total Bilirubin 0.3 - 1.2 mg/dL 0.8 0.8 -  Alkaline Phos 38 - 126 U/L 55 63 -  AST 15 - 41 U/L 15 26 -  ALT 0 - 44 U/L 13 26 -      Component Value Date/Time   RBC 4.62 07/31/2020 1416   MCV 92.6 07/31/2020 1416   MCH 30.7 07/31/2020 1416   MCHC 33.2 07/31/2020 1416    RDW 15.1 07/31/2020 1416   LYMPHSABS 2.1 07/31/2020 1416   MONOABS 0.9 07/31/2020 1416   EOSABS 0.5 07/31/2020 1416   BASOSABS 0.1 07/31/2020 1416   Lab Results  Component Value Date   LDH 98 07/31/2020   LDH 103 03/13/2020   Lab Results  Component Value Date   TOTALPROTELP 6.1 07/31/2020   TOTALPROTELP 6.1 07/31/2020   ALBUMINELP 3.4 07/31/2020   A1GS 0.3 07/31/2020   A2GS 0.7 07/31/2020   BETS 0.8 07/31/2020   GAMS 0.9 07/31/2020   MSPIKE Not Observed 07/31/2020   SPEI Comment 07/31/2020    Lab Results  Component Value Date   KPAFRELGTCHN 65.7 (H) 07/31/2020   LAMBDASER 27.6 (H) 07/31/2020   KAPLAMBRATIO 2.38 (H) 07/31/2020    DIAGNOSTIC IMAGING:  I have independently reviewed the scans and discussed with the patient. No results found.   ASSESSMENT:  1. Monoclonal gammopathy: -Patient seen at the request of Dr. Theador Hawthorne for further work-up of monoclonal gammopathy.  UPEP on 11/25/2019 showed poorly defined area of restricted protein mobility reactive to IgG and kappa antisera. -Labs from 03/13/2020 shows M spike is not seen.  Free light chain ratio is elevated at 3.31.  Kappa light chain 67.6, lambda light chains 20.4.  Immunofixation was unremarkable. -Skeletal survey on 03/13/2020 with no lucent lesions of myeloma.  2.  CKD: -CKD stage IIIb since 2016 secondary to diabetes and hypertension.  He has nephrotic range proteinuria. -Ultrasound of the kidneys on 12/06/2019 shows bilateral  renal cyst with no obstructing lesions.  Renal cortical thickness and renal echogenicity normal bilaterally. -Kidney biopsy on 03/14/2020 consistent with arteriosclerosis with arterionephrosclerosis and FSGS.   PLAN:  1.  Elevated free light chains: -We reviewed recent labs.  Free kappa light chains are 65.7 and ratio is 2.38.  Hemoglobin was normal.  SPEP was negative.  Creatinine is 1.82 and calcium was normal. -Free elevated light chain ratio likely from CKD.  However if there is any  significant changes, will consider bone marrow biopsy. -RTC 6 months with SPEP, free light chains and serum immunofixation.  2. CKD: -Latest creatinine improved to 1.82.  3.  Prostate cancer: -He follows up with Dr. Brendia Sacks at [facility] and is under watchful waiting.    Orders placed this encounter:  No orders of the defined types were placed in this encounter.    Derek Jack, MD [place] [phone]   I, Milinda Antis, am acting as a scribe for Dr. Sanda Linger.  I, Derek Jack MD, have reviewed the above documentation for accuracy and completeness, and I agree with the above.

## 2020-08-07 NOTE — Patient Instructions (Signed)
[facility] Discharge Instructions  You were seen today by Dr. Katragadda. He went over your recent results. Dr. Katragadda will see you back in 6 months for labs and follow up.   Thank you for choosing [facility] at [facility] to provide your oncology and hematology care.  To afford each patient quality time with our provider, please arrive at least 15 minutes before your scheduled appointment time.   If you have a lab appointment with the Cancer Center please come in thru the Main Entrance and check in at the main information desk  You need to re-schedule your appointment should you arrive 10 or more minutes late.  We strive to give you quality time with our providers, and arriving late affects you and other patients whose appointments are after yours.  Also, if you no show three or more times for appointments you may be dismissed from the clinic at the providers discretion.     Again, thank you for choosing [facility].  Our hope is that these requests will decrease the amount of time that you wait before being seen by our physicians.       _____________________________________________________________  Should you have questions after your visit to [facility], please contact our office at [phone] between the hours of 8:00 a.m. and 4:30 p.m.  Voicemails left after 4:00 p.m. will not be returned until the following business day.  For prescription refill requests, have your pharmacy contact our office and allow 72 hours.    Cancer Center Support Programs:   > Cancer Support Group  2nd Tuesday of the month 1pm-2pm, Journey Room    

## 2020-08-14 DIAGNOSIS — Z89512 Acquired absence of left leg below knee: Secondary | ICD-10-CM | POA: Diagnosis not present

## 2020-08-14 DIAGNOSIS — Z9049 Acquired absence of other specified parts of digestive tract: Secondary | ICD-10-CM | POA: Diagnosis not present

## 2020-08-14 DIAGNOSIS — Z299 Encounter for prophylactic measures, unspecified: Secondary | ICD-10-CM | POA: Diagnosis not present

## 2020-08-14 DIAGNOSIS — Z7984 Long term (current) use of oral hypoglycemic drugs: Secondary | ICD-10-CM | POA: Diagnosis not present

## 2020-08-14 DIAGNOSIS — L97811 Non-pressure chronic ulcer of other part of right lower leg limited to breakdown of skin: Secondary | ICD-10-CM | POA: Diagnosis not present

## 2020-08-14 DIAGNOSIS — I10 Essential (primary) hypertension: Secondary | ICD-10-CM | POA: Diagnosis not present

## 2020-08-14 DIAGNOSIS — L97509 Non-pressure chronic ulcer of other part of unspecified foot with unspecified severity: Secondary | ICD-10-CM | POA: Diagnosis not present

## 2020-08-14 DIAGNOSIS — Z792 Long term (current) use of antibiotics: Secondary | ICD-10-CM | POA: Diagnosis not present

## 2020-08-14 DIAGNOSIS — R609 Edema, unspecified: Secondary | ICD-10-CM | POA: Diagnosis not present

## 2020-08-14 DIAGNOSIS — L97519 Non-pressure chronic ulcer of other part of right foot with unspecified severity: Secondary | ICD-10-CM | POA: Diagnosis not present

## 2020-08-14 DIAGNOSIS — S81811A Laceration without foreign body, right lower leg, initial encounter: Secondary | ICD-10-CM | POA: Diagnosis not present

## 2020-08-14 DIAGNOSIS — I129 Hypertensive chronic kidney disease with stage 1 through stage 4 chronic kidney disease, or unspecified chronic kidney disease: Secondary | ICD-10-CM | POA: Diagnosis not present

## 2020-08-14 DIAGNOSIS — E1122 Type 2 diabetes mellitus with diabetic chronic kidney disease: Secondary | ICD-10-CM | POA: Diagnosis not present

## 2020-08-14 DIAGNOSIS — N189 Chronic kidney disease, unspecified: Secondary | ICD-10-CM | POA: Diagnosis not present

## 2020-08-14 DIAGNOSIS — Z6836 Body mass index (BMI) 36.0-36.9, adult: Secondary | ICD-10-CM | POA: Diagnosis not present

## 2020-08-14 DIAGNOSIS — S81811D Laceration without foreign body, right lower leg, subsequent encounter: Secondary | ICD-10-CM | POA: Diagnosis not present

## 2020-08-14 DIAGNOSIS — L539 Erythematous condition, unspecified: Secondary | ICD-10-CM | POA: Diagnosis not present

## 2020-08-14 DIAGNOSIS — E11621 Type 2 diabetes mellitus with foot ulcer: Secondary | ICD-10-CM | POA: Diagnosis not present

## 2020-08-14 DIAGNOSIS — E1165 Type 2 diabetes mellitus with hyperglycemia: Secondary | ICD-10-CM | POA: Diagnosis not present

## 2020-08-18 DIAGNOSIS — E119 Type 2 diabetes mellitus without complications: Secondary | ICD-10-CM | POA: Diagnosis not present

## 2020-08-20 DIAGNOSIS — E11319 Type 2 diabetes mellitus with unspecified diabetic retinopathy without macular edema: Secondary | ICD-10-CM | POA: Diagnosis not present

## 2020-08-22 DIAGNOSIS — I129 Hypertensive chronic kidney disease with stage 1 through stage 4 chronic kidney disease, or unspecified chronic kidney disease: Secondary | ICD-10-CM | POA: Diagnosis not present

## 2020-08-22 DIAGNOSIS — D472 Monoclonal gammopathy: Secondary | ICD-10-CM | POA: Diagnosis not present

## 2020-08-22 DIAGNOSIS — N041 Nephrotic syndrome with focal and segmental glomerular lesions: Secondary | ICD-10-CM | POA: Diagnosis not present

## 2020-08-22 DIAGNOSIS — D638 Anemia in other chronic diseases classified elsewhere: Secondary | ICD-10-CM | POA: Diagnosis not present

## 2020-08-22 DIAGNOSIS — E1129 Type 2 diabetes mellitus with other diabetic kidney complication: Secondary | ICD-10-CM | POA: Diagnosis not present

## 2020-08-22 DIAGNOSIS — R809 Proteinuria, unspecified: Secondary | ICD-10-CM | POA: Diagnosis not present

## 2020-08-28 DIAGNOSIS — S81811A Laceration without foreign body, right lower leg, initial encounter: Secondary | ICD-10-CM | POA: Diagnosis not present

## 2020-08-28 DIAGNOSIS — L97412 Non-pressure chronic ulcer of right heel and midfoot with fat layer exposed: Secondary | ICD-10-CM | POA: Diagnosis not present

## 2020-08-28 DIAGNOSIS — E11621 Type 2 diabetes mellitus with foot ulcer: Secondary | ICD-10-CM | POA: Diagnosis not present

## 2020-08-29 DIAGNOSIS — R809 Proteinuria, unspecified: Secondary | ICD-10-CM | POA: Diagnosis not present

## 2020-08-29 DIAGNOSIS — I129 Hypertensive chronic kidney disease with stage 1 through stage 4 chronic kidney disease, or unspecified chronic kidney disease: Secondary | ICD-10-CM | POA: Diagnosis not present

## 2020-08-29 DIAGNOSIS — R600 Localized edema: Secondary | ICD-10-CM | POA: Diagnosis not present

## 2020-08-29 DIAGNOSIS — D472 Monoclonal gammopathy: Secondary | ICD-10-CM | POA: Diagnosis not present

## 2020-08-29 DIAGNOSIS — N041 Nephrotic syndrome with focal and segmental glomerular lesions: Secondary | ICD-10-CM | POA: Diagnosis not present

## 2020-08-29 DIAGNOSIS — E211 Secondary hyperparathyroidism, not elsewhere classified: Secondary | ICD-10-CM | POA: Diagnosis not present

## 2020-09-05 DIAGNOSIS — L89892 Pressure ulcer of other site, stage 2: Secondary | ICD-10-CM | POA: Diagnosis not present

## 2020-09-05 DIAGNOSIS — E1140 Type 2 diabetes mellitus with diabetic neuropathy, unspecified: Secondary | ICD-10-CM | POA: Diagnosis not present

## 2020-09-05 DIAGNOSIS — E1151 Type 2 diabetes mellitus with diabetic peripheral angiopathy without gangrene: Secondary | ICD-10-CM | POA: Diagnosis not present

## 2020-09-19 DIAGNOSIS — E1151 Type 2 diabetes mellitus with diabetic peripheral angiopathy without gangrene: Secondary | ICD-10-CM | POA: Diagnosis not present

## 2020-09-19 DIAGNOSIS — E1140 Type 2 diabetes mellitus with diabetic neuropathy, unspecified: Secondary | ICD-10-CM | POA: Diagnosis not present

## 2020-09-19 DIAGNOSIS — L89892 Pressure ulcer of other site, stage 2: Secondary | ICD-10-CM | POA: Diagnosis not present

## 2020-10-03 DIAGNOSIS — L89892 Pressure ulcer of other site, stage 2: Secondary | ICD-10-CM | POA: Diagnosis not present

## 2020-10-03 DIAGNOSIS — E1151 Type 2 diabetes mellitus with diabetic peripheral angiopathy without gangrene: Secondary | ICD-10-CM | POA: Diagnosis not present

## 2020-10-03 DIAGNOSIS — E1140 Type 2 diabetes mellitus with diabetic neuropathy, unspecified: Secondary | ICD-10-CM | POA: Diagnosis not present

## 2020-10-18 DIAGNOSIS — E119 Type 2 diabetes mellitus without complications: Secondary | ICD-10-CM | POA: Diagnosis not present

## 2020-10-18 DIAGNOSIS — E7800 Pure hypercholesterolemia, unspecified: Secondary | ICD-10-CM | POA: Diagnosis not present

## 2020-10-31 DIAGNOSIS — D472 Monoclonal gammopathy: Secondary | ICD-10-CM | POA: Diagnosis not present

## 2020-10-31 DIAGNOSIS — E1140 Type 2 diabetes mellitus with diabetic neuropathy, unspecified: Secondary | ICD-10-CM | POA: Diagnosis not present

## 2020-10-31 DIAGNOSIS — R809 Proteinuria, unspecified: Secondary | ICD-10-CM | POA: Diagnosis not present

## 2020-10-31 DIAGNOSIS — R600 Localized edema: Secondary | ICD-10-CM | POA: Diagnosis not present

## 2020-10-31 DIAGNOSIS — E211 Secondary hyperparathyroidism, not elsewhere classified: Secondary | ICD-10-CM | POA: Diagnosis not present

## 2020-10-31 DIAGNOSIS — N041 Nephrotic syndrome with focal and segmental glomerular lesions: Secondary | ICD-10-CM | POA: Diagnosis not present

## 2020-10-31 DIAGNOSIS — I129 Hypertensive chronic kidney disease with stage 1 through stage 4 chronic kidney disease, or unspecified chronic kidney disease: Secondary | ICD-10-CM | POA: Diagnosis not present

## 2020-10-31 DIAGNOSIS — L89892 Pressure ulcer of other site, stage 2: Secondary | ICD-10-CM | POA: Diagnosis not present

## 2020-10-31 DIAGNOSIS — E1151 Type 2 diabetes mellitus with diabetic peripheral angiopathy without gangrene: Secondary | ICD-10-CM | POA: Diagnosis not present

## 2020-11-02 DIAGNOSIS — I739 Peripheral vascular disease, unspecified: Secondary | ICD-10-CM | POA: Diagnosis not present

## 2020-11-02 DIAGNOSIS — E1165 Type 2 diabetes mellitus with hyperglycemia: Secondary | ICD-10-CM | POA: Diagnosis not present

## 2020-11-02 DIAGNOSIS — I10 Essential (primary) hypertension: Secondary | ICD-10-CM | POA: Diagnosis not present

## 2020-11-02 DIAGNOSIS — E11621 Type 2 diabetes mellitus with foot ulcer: Secondary | ICD-10-CM | POA: Diagnosis not present

## 2020-11-02 DIAGNOSIS — L97509 Non-pressure chronic ulcer of other part of unspecified foot with unspecified severity: Secondary | ICD-10-CM | POA: Diagnosis not present

## 2020-11-02 DIAGNOSIS — Z299 Encounter for prophylactic measures, unspecified: Secondary | ICD-10-CM | POA: Diagnosis not present

## 2020-11-07 DIAGNOSIS — I739 Peripheral vascular disease, unspecified: Secondary | ICD-10-CM | POA: Diagnosis not present

## 2020-11-07 DIAGNOSIS — R809 Proteinuria, unspecified: Secondary | ICD-10-CM | POA: Diagnosis not present

## 2020-11-07 DIAGNOSIS — R600 Localized edema: Secondary | ICD-10-CM | POA: Diagnosis not present

## 2020-11-07 DIAGNOSIS — N041 Nephrotic syndrome with focal and segmental glomerular lesions: Secondary | ICD-10-CM | POA: Diagnosis not present

## 2020-11-07 DIAGNOSIS — I70213 Atherosclerosis of native arteries of extremities with intermittent claudication, bilateral legs: Secondary | ICD-10-CM | POA: Diagnosis not present

## 2020-11-07 DIAGNOSIS — E211 Secondary hyperparathyroidism, not elsewhere classified: Secondary | ICD-10-CM | POA: Diagnosis not present

## 2020-11-07 DIAGNOSIS — I129 Hypertensive chronic kidney disease with stage 1 through stage 4 chronic kidney disease, or unspecified chronic kidney disease: Secondary | ICD-10-CM | POA: Diagnosis not present

## 2020-11-12 DIAGNOSIS — I483 Typical atrial flutter: Secondary | ICD-10-CM | POA: Diagnosis not present

## 2020-11-20 DIAGNOSIS — E1165 Type 2 diabetes mellitus with hyperglycemia: Secondary | ICD-10-CM | POA: Diagnosis not present

## 2020-11-20 DIAGNOSIS — I10 Essential (primary) hypertension: Secondary | ICD-10-CM | POA: Diagnosis not present

## 2020-11-20 DIAGNOSIS — Z299 Encounter for prophylactic measures, unspecified: Secondary | ICD-10-CM | POA: Diagnosis not present

## 2020-11-20 DIAGNOSIS — Z6836 Body mass index (BMI) 36.0-36.9, adult: Secondary | ICD-10-CM | POA: Diagnosis not present

## 2020-11-20 DIAGNOSIS — E1122 Type 2 diabetes mellitus with diabetic chronic kidney disease: Secondary | ICD-10-CM | POA: Diagnosis not present

## 2020-11-20 DIAGNOSIS — S88119A Complete traumatic amputation at level between knee and ankle, unspecified lower leg, initial encounter: Secondary | ICD-10-CM | POA: Diagnosis not present

## 2020-11-20 DIAGNOSIS — Z789 Other specified health status: Secondary | ICD-10-CM | POA: Diagnosis not present

## 2020-11-28 DIAGNOSIS — L89892 Pressure ulcer of other site, stage 2: Secondary | ICD-10-CM | POA: Diagnosis not present

## 2020-11-28 DIAGNOSIS — M79671 Pain in right foot: Secondary | ICD-10-CM | POA: Diagnosis not present

## 2020-11-28 DIAGNOSIS — E1151 Type 2 diabetes mellitus with diabetic peripheral angiopathy without gangrene: Secondary | ICD-10-CM | POA: Diagnosis not present

## 2020-11-28 DIAGNOSIS — E1140 Type 2 diabetes mellitus with diabetic neuropathy, unspecified: Secondary | ICD-10-CM | POA: Diagnosis not present

## 2020-12-19 DIAGNOSIS — L89892 Pressure ulcer of other site, stage 2: Secondary | ICD-10-CM | POA: Diagnosis not present

## 2020-12-19 DIAGNOSIS — E1151 Type 2 diabetes mellitus with diabetic peripheral angiopathy without gangrene: Secondary | ICD-10-CM | POA: Diagnosis not present

## 2020-12-19 DIAGNOSIS — E1140 Type 2 diabetes mellitus with diabetic neuropathy, unspecified: Secondary | ICD-10-CM | POA: Diagnosis not present

## 2020-12-24 DIAGNOSIS — N281 Cyst of kidney, acquired: Secondary | ICD-10-CM | POA: Diagnosis not present

## 2020-12-24 DIAGNOSIS — N401 Enlarged prostate with lower urinary tract symptoms: Secondary | ICD-10-CM | POA: Diagnosis not present

## 2020-12-24 DIAGNOSIS — C61 Malignant neoplasm of prostate: Secondary | ICD-10-CM | POA: Diagnosis not present

## 2021-01-02 DIAGNOSIS — I129 Hypertensive chronic kidney disease with stage 1 through stage 4 chronic kidney disease, or unspecified chronic kidney disease: Secondary | ICD-10-CM | POA: Diagnosis not present

## 2021-01-02 DIAGNOSIS — R600 Localized edema: Secondary | ICD-10-CM | POA: Diagnosis not present

## 2021-01-02 DIAGNOSIS — N041 Nephrotic syndrome with focal and segmental glomerular lesions: Secondary | ICD-10-CM | POA: Diagnosis not present

## 2021-01-02 DIAGNOSIS — R809 Proteinuria, unspecified: Secondary | ICD-10-CM | POA: Diagnosis not present

## 2021-01-02 DIAGNOSIS — E211 Secondary hyperparathyroidism, not elsewhere classified: Secondary | ICD-10-CM | POA: Diagnosis not present

## 2021-01-09 DIAGNOSIS — L89892 Pressure ulcer of other site, stage 2: Secondary | ICD-10-CM | POA: Diagnosis not present

## 2021-01-09 DIAGNOSIS — E1151 Type 2 diabetes mellitus with diabetic peripheral angiopathy without gangrene: Secondary | ICD-10-CM | POA: Diagnosis not present

## 2021-01-09 DIAGNOSIS — R809 Proteinuria, unspecified: Secondary | ICD-10-CM | POA: Diagnosis not present

## 2021-01-09 DIAGNOSIS — E1140 Type 2 diabetes mellitus with diabetic neuropathy, unspecified: Secondary | ICD-10-CM | POA: Diagnosis not present

## 2021-01-09 DIAGNOSIS — E211 Secondary hyperparathyroidism, not elsewhere classified: Secondary | ICD-10-CM | POA: Diagnosis not present

## 2021-01-09 DIAGNOSIS — N041 Nephrotic syndrome with focal and segmental glomerular lesions: Secondary | ICD-10-CM | POA: Diagnosis not present

## 2021-01-09 DIAGNOSIS — I129 Hypertensive chronic kidney disease with stage 1 through stage 4 chronic kidney disease, or unspecified chronic kidney disease: Secondary | ICD-10-CM | POA: Diagnosis not present

## 2021-01-09 DIAGNOSIS — L03115 Cellulitis of right lower limb: Secondary | ICD-10-CM | POA: Diagnosis not present

## 2021-01-19 ENCOUNTER — Other Ambulatory Visit: Payer: Self-pay

## 2021-01-19 DIAGNOSIS — L97519 Non-pressure chronic ulcer of other part of right foot with unspecified severity: Secondary | ICD-10-CM

## 2021-01-22 ENCOUNTER — Ambulatory Visit: Payer: Medicare Other

## 2021-01-22 ENCOUNTER — Encounter (HOSPITAL_COMMUNITY): Payer: Medicare Other

## 2021-01-23 ENCOUNTER — Encounter (HOSPITAL_COMMUNITY): Payer: Medicare Other

## 2021-01-23 ENCOUNTER — Ambulatory Visit: Payer: Medicare Other

## 2021-01-30 ENCOUNTER — Ambulatory Visit (HOSPITAL_COMMUNITY)
Admission: RE | Admit: 2021-01-30 | Discharge: 2021-01-30 | Disposition: A | Discharge status: Home / Self Care | Payer: Medicare Other | Source: Ambulatory Visit | Attending: Vascular Surgery | Admitting: Vascular Surgery

## 2021-01-30 ENCOUNTER — Ambulatory Visit (INDEPENDENT_AMBULATORY_CARE_PROVIDER_SITE_OTHER): Payer: Medicare Other | Admitting: Physician Assistant

## 2021-01-30 ENCOUNTER — Other Ambulatory Visit: Payer: Self-pay

## 2021-01-30 VITALS — BP 110/70 | HR 56 | Temp 98.1°F | Resp 20 | Ht 67.0 in | Wt 234.3 lb

## 2021-01-30 DIAGNOSIS — L97519 Non-pressure chronic ulcer of other part of right foot with unspecified severity: Secondary | ICD-10-CM | POA: Insufficient documentation

## 2021-01-30 NOTE — Progress Notes (Signed)
HISTORY AND PHYSICAL     CC:  follow up. Requesting Provider:  Monico Blitz, MD  HPI: This is a 75 y.o. male who was seen by Dr. Donzetta Matters in 2019 for a wound on the right foot that was very slow to heal.  At that time, pt had noninvasive vascular studies and had triphasic waveforms and a palpable DP pulse.  He also has hx of left BKA in 2016.  He states that his longtime girlfriend had passed away from leukemia and he neglected his foot that subsequently ended in left BKA.   Pt is referred back by Dr. Blanch Media from podiatry for right foot wound.  Pt states the wound on his foot is slow to heal.  He denies any claudication when walking.  He has neuropathy in his foot at night at times.  He states his neuropathy is bad and he can bump his foot and never know.  He tells me he has CKD III.  He also has swelling in his right leg.  He has never smoked.  He has a hard time checking his feet as he says he can't really get to it to see it.   The pt is on a statin for cholesterol management.    The pt is not on an aspirin.    Other AC:  none The pt is on ARB, BB for hypertension.  The pt does have diabetes. Tobacco hx:  never  Pt does not have family hx of AAA.  Past Medical History:  Diagnosis Date  . Anemia   . Anxiety   . Cancer [facility])    Prostate- Stage I  . Critical lower limb ischemia ([place])   . Essential hypertension   . Gangrene of foot [facility]) March 2016   Left foot  . Hyperlipidemia   . Neuropathy   . Type 2 diabetes mellitus ([place])     Past Surgical History:  Procedure Laterality Date  . AMPUTATION Left 01/12/2015   Procedure: AMPUTATION BELOW KNEE;  Surgeon: Leandrew Koyanagi, MD;  Location: [place];  Service: Orthopedics;  Laterality: Left;  . APPLICATION OF WOUND VAC Right 01/12/2015   Procedure: APPLICATION OF WOUND VAC;  Surgeon: Leandrew Koyanagi, MD;  Location: [place];  Service: Orthopedics;  Laterality: Right;  . CARDIAC ELECTROPHYSIOLOGY STUDY AND ABLATION    . CHOLECYSTECTOMY    . ORIF  HUMERUS FRACTURE      No Known Allergies  Current Outpatient Medications  Medication Sig Dispense Refill  . ALPRAZolam (XANAX) 1 MG tablet Take 1.5 mg by mouth at bedtime.    Marland Kitchen ascorbic acid (VITAMIN C) 1000 MG tablet Take 1,000 mg by mouth daily in the afternoon.     . cadexomer iodine (IODOSORB) 0.9 % gel Apply 1 application topically daily as needed for wound care.     . Calcium Carbonate (CALCIUM 600 PO) Take 600 mg by mouth daily in the afternoon.    . finasteride (PROSCAR) 5 MG tablet Take 5 mg by mouth daily in the afternoon.     . furosemide (LASIX) 40 MG tablet Take 40 mg by mouth daily in the afternoon.     . gabapentin (NEURONTIN) 300 MG capsule Take 600 mg by mouth 2 (two) times daily as needed (pain.).     Marland Kitchen glimepiride (AMARYL) 4 MG tablet Take 4 mg by mouth daily in the afternoon.     . hydrALAZINE (APRESOLINE) 100 MG tablet Take 100 mg by mouth in the morning and at bedtime.     Marland Kitchen  HYDROcodone-acetaminophen (NORCO/VICODIN) 5-325 MG tablet Take 1 tablet by mouth 2 (two) times daily as needed.    Marland Kitchen losartan (COZAAR) 25 MG tablet Take 25 mg by mouth daily.    . metoprolol (LOPRESSOR) 100 MG tablet Take 100 mg by mouth 2 (two) times daily.    . Multiple Vitamins-Minerals (MULTIVITAMIN WITH MINERALS) tablet Take 1 tablet by mouth daily in the afternoon.     . Omega-3 Fatty Acids (FISH OIL) 1200 MG CAPS Take 1,200 mg by mouth daily in the afternoon.    . potassium chloride (KLOR-CON) 10 MEQ tablet Take 10 mEq by mouth daily.    . sildenafil (REVATIO) 20 MG tablet Take by mouth.    . simvastatin (ZOCOR) 40 MG tablet Take 40 mg by mouth daily in the afternoon.     . tamsulosin (FLOMAX) 0.4 MG CAPS capsule Take 0.4 mg by mouth daily in the afternoon.     . vitamin E 400 UNIT capsule Take 400 Units by mouth daily.     No current facility-administered medications for this visit.    Family History  Problem Relation Age of Onset  . Hypertension Father        and kidney failure  .  Heart disease Father   . Heart attack Father   . Kidney disease Father   . Alzheimer's disease Mother   . Heart attack Maternal Grandfather   . Heart attack Paternal Grandfather   . Colon cancer Paternal Aunt 69       s/p surgical rsxn; lived to 75 yrs old  . Hypertension Sister   . Hypercholesterolemia Sister   . Cervical cancer Sister   . Gastric cancer Neg Hx   . Esophageal cancer Neg Hx     Social History   Socioeconomic History  . Marital status: Single    Spouse name: Not on file  . Number of children: 0  . Years of education: Not on file  . Highest education level: Not on file  Occupational History  . Occupation: RETIRED  Tobacco Use  . Smoking status: Never Smoker  . Smokeless tobacco: Never Used  Vaping Use  . Vaping Use: Never used  Substance and Sexual Activity  . Alcohol use: Yes    Alcohol/week: 0.0 standard drinks    Comment: rare  . Drug use: No  . Sexual activity: Not Currently  Other Topics Concern  . Not on file  Social History Narrative  . Not on file   Social Determinants of Health   Financial Resource Strain: Medium Risk  . Difficulty of Paying Living Expenses: Somewhat hard  Food Insecurity: Food Insecurity Present  . Worried About Charity fundraiser in the Last Year: Sometimes true  . Ran Out of Food in the Last Year: Sometimes true  Transportation Needs: Unmet Transportation Needs  . Lack of Transportation (Medical): Yes  . Lack of Transportation (Non-Medical): Yes  Physical Activity: Inactive  . Days of Exercise per Week: 0 days  . Minutes of Exercise per Session: 0 min  Stress: No Stress Concern Present  . Feeling of Stress : Not at all  Social Connections: Socially Isolated  . Frequency of Communication with Friends and Family: More than three times a week  . Frequency of Social Gatherings with Friends and Family: Twice a week  . Attends Religious Services: Never  . Active Member of Clubs or Organizations: No  . Attends Theatre manager Meetings: Never  . Marital Status: Never married  Intimate  Partner Violence: Not At Risk  . Fear of Current or Ex-Partner: No  . Emotionally Abused: No  . Physically Abused: No  . Sexually Abused: No     REVIEW OF SYSTEMS:   [X]  denotes positive finding, [ ]  denotes negative finding Cardiac  Comments:  Chest pain or chest pressure:    Shortness of breath upon exertion:    Short of breath when lying flat:    Irregular heart rhythm:        Vascular    Pain in calf, thigh, or hip brought on by ambulation:    Pain in feet at night that wakes you up from your sleep:     Blood clot in your veins:    Leg swelling:  x       Pulmonary    Oxygen at home:    Productive cough:     Wheezing:         Neurologic    Sudden weakness in arms or legs:     Sudden numbness in arms or legs:     Sudden onset of difficulty speaking or slurred speech:    Temporary loss of vision in one eye:     Problems with dizziness:         Gastrointestinal    Blood in stool:     Vomited blood:         Genitourinary    Burning when urinating:     Blood in urine:        Psychiatric    Major depression:         Hematologic    Bleeding problems:    Problems with blood clotting too easily:        Skin    Rashes or ulcers: x       Constitutional    Fever or chills:      PHYSICAL EXAMINATION:  Today's Vitals   01/30/21 1111  BP: 110/70  Pulse: (!) 56  Resp: 20  Temp: 98.1 F (36.7 C)  TempSrc: Temporal  SpO2: 95%  Weight: 234 lb 4.8 oz (106.3 kg)  Height: 5\' 7"  (1.702 m)   Body mass index is 36.7 kg/m.   General:  WDWN in NAD; vital signs documented above Gait: Not observed HENT: WNL, normocephalic Pulmonary: normal non-labored breathing , without wheezing Cardiac: regular HR, without  Murmur; without carotid bruits Abdomen: soft, NT, no masses; aortic pulse is not palpable Skin: without rashes Vascular Exam/Pulses:  Right Left  Radial 2+ (normal) 2+ (normal)   DP 2+ (normal) & triphasic doppler signal BKA  PT Triphasic doppler signal BKA  Peroneal Triphasic doppler signal BKA   Extremities:         Musculoskeletal: no muscle wasting or atrophy  Neurologic: A&O X 3;  No focal weakness or paresthesias are detected Psychiatric:  The pt has Normal affect.   Non-Invasive Vascular Imaging:   ABI's/TBI's on 01/30/2021: Right:  1/17/1.13 (triphasic waveformes)- Great toe pressure: 143 BKA  Previous ABI's/TBI's on 01/13/2018: Right:  1.36 - Great toe pressure: not obtained Left:  BKA   ASSESSMENT/PLAN:: 75 y.o. male with hx of DM, HTN, CKD IIIb with slow healing wounds on the right foot.  -pt with severe neuropathy in his feet as well as his hands.   -he has slow healing wounds on the right foot.  He has an easily palpable dorsalis pedis pulse on the right and triphasic doppler signals right DP/PT/peroneal with normal ABI with triphasic waveforms and therefore, would  not recommend arteriogram or endovascular interventions.  His great toe pressure is 143 and should be adequate for wound healing.    -discussed with pt to check his foot everyday and be very aware of his surroundings and hitting foot on furniture.  -pt will f/u as needed.   Leontine Locket, [facility] [phone]  Clinic MD:   Scot Dock

## 2021-02-05 ENCOUNTER — Other Ambulatory Visit: Payer: Self-pay

## 2021-02-05 ENCOUNTER — Inpatient Hospital Stay (HOSPITAL_COMMUNITY): Payer: Medicare Other | Attending: Hematology

## 2021-02-05 DIAGNOSIS — E1122 Type 2 diabetes mellitus with diabetic chronic kidney disease: Secondary | ICD-10-CM | POA: Insufficient documentation

## 2021-02-05 DIAGNOSIS — Z79899 Other long term (current) drug therapy: Secondary | ICD-10-CM | POA: Insufficient documentation

## 2021-02-05 DIAGNOSIS — D472 Monoclonal gammopathy: Secondary | ICD-10-CM

## 2021-02-05 DIAGNOSIS — Z7984 Long term (current) use of oral hypoglycemic drugs: Secondary | ICD-10-CM | POA: Diagnosis not present

## 2021-02-05 DIAGNOSIS — I129 Hypertensive chronic kidney disease with stage 1 through stage 4 chronic kidney disease, or unspecified chronic kidney disease: Secondary | ICD-10-CM | POA: Insufficient documentation

## 2021-02-05 DIAGNOSIS — E785 Hyperlipidemia, unspecified: Secondary | ICD-10-CM | POA: Insufficient documentation

## 2021-02-05 DIAGNOSIS — N281 Cyst of kidney, acquired: Secondary | ICD-10-CM | POA: Insufficient documentation

## 2021-02-05 DIAGNOSIS — C61 Malignant neoplasm of prostate: Secondary | ICD-10-CM | POA: Diagnosis not present

## 2021-02-05 DIAGNOSIS — E11621 Type 2 diabetes mellitus with foot ulcer: Secondary | ICD-10-CM | POA: Insufficient documentation

## 2021-02-05 DIAGNOSIS — N1832 Chronic kidney disease, stage 3b: Secondary | ICD-10-CM | POA: Insufficient documentation

## 2021-02-05 LAB — COMPREHENSIVE METABOLIC PANEL
ALT: 18 U/L (ref 0–44)
AST: 17 U/L (ref 15–41)
Albumin: 3.7 g/dL (ref 3.5–5.0)
Alkaline Phosphatase: 56 U/L (ref 38–126)
Anion gap: 13 (ref 5–15)
BUN: 27 mg/dL — ABNORMAL HIGH (ref 8–23)
CO2: 28 mmol/L (ref 22–32)
Calcium: 8.8 mg/dL — ABNORMAL LOW (ref 8.9–10.3)
Chloride: 98 mmol/L (ref 98–111)
Creatinine, Ser: 1.6 mg/dL — ABNORMAL HIGH (ref 0.61–1.24)
GFR, Estimated: 45 mL/min — ABNORMAL LOW (ref >60)
Glucose, Bld: 122 mg/dL — ABNORMAL HIGH (ref 70–99)
Potassium: 3.7 mmol/L (ref 3.5–5.1)
Sodium: 139 mmol/L (ref 135–145)
Total Bilirubin: 0.6 mg/dL (ref 0.3–1.2)
Total Protein: 6.8 g/dL (ref 6.5–8.1)

## 2021-02-05 LAB — CBC WITH DIFFERENTIAL/PLATELET
Abs Immature Granulocytes: 0.04 10*3/uL (ref 0.00–0.07)
Basophils Absolute: 0.1 10*3/uL (ref 0.0–0.1)
Basophils Relative: 1 %
Eosinophils Absolute: 0.5 10*3/uL (ref 0.0–0.5)
Eosinophils Relative: 5 %
HCT: 47 % (ref 39.0–52.0)
Hemoglobin: 15.2 g/dL (ref 13.0–17.0)
Immature Granulocytes: 0 %
Lymphocytes Relative: 25 %
Lymphs Abs: 2.6 10*3/uL (ref 0.7–4.0)
MCH: 30.3 pg (ref 26.0–34.0)
MCHC: 32.3 g/dL (ref 30.0–36.0)
MCV: 93.6 fL (ref 80.0–100.0)
Monocytes Absolute: 0.9 10*3/uL (ref 0.1–1.0)
Monocytes Relative: 9 %
Neutro Abs: 6.4 10*3/uL (ref 1.7–7.7)
Neutrophils Relative %: 60 %
Platelets: 210 10*3/uL (ref 150–400)
RBC: 5.02 MIL/uL (ref 4.22–5.81)
RDW: 14.6 % (ref 11.5–15.5)
WBC: 10.5 10*3/uL (ref 4.0–10.5)
nRBC: 0 % (ref 0.0–0.2)

## 2021-02-06 DIAGNOSIS — E1151 Type 2 diabetes mellitus with diabetic peripheral angiopathy without gangrene: Secondary | ICD-10-CM | POA: Diagnosis not present

## 2021-02-06 DIAGNOSIS — L89892 Pressure ulcer of other site, stage 2: Secondary | ICD-10-CM | POA: Diagnosis not present

## 2021-02-06 DIAGNOSIS — E1140 Type 2 diabetes mellitus with diabetic neuropathy, unspecified: Secondary | ICD-10-CM | POA: Diagnosis not present

## 2021-02-06 LAB — IMMUNOFIXATION ELECTROPHORESIS
IgA: 236 mg/dL (ref 61–437)
IgG (Immunoglobin G), Serum: 846 mg/dL (ref 603–1613)
IgM (Immunoglobulin M), Srm: 61 mg/dL (ref 15–143)
Total Protein ELP: 6.4 g/dL (ref 6.0–8.5)

## 2021-02-06 LAB — KAPPA/LAMBDA LIGHT CHAINS
Kappa free light chain: 65.7 mg/L — ABNORMAL HIGH (ref 3.3–19.4)
Kappa, lambda light chain ratio: 2.11 — ABNORMAL HIGH (ref 0.26–1.65)
Lambda free light chains: 31.2 mg/L — ABNORMAL HIGH (ref 5.7–26.3)

## 2021-02-07 LAB — PROTEIN ELECTROPHORESIS, SERUM
A/G Ratio: 1.4 (ref 0.7–1.7)
Albumin ELP: 3.8 g/dL (ref 2.9–4.4)
Alpha-1-Globulin: 0.3 g/dL (ref 0.0–0.4)
Alpha-2-Globulin: 0.9 g/dL (ref 0.4–1.0)
Beta Globulin: 0.9 g/dL (ref 0.7–1.3)
Gamma Globulin: 0.8 g/dL (ref 0.4–1.8)
Globulin, Total: 2.8 g/dL (ref 2.2–3.9)
Total Protein ELP: 6.6 g/dL (ref 6.0–8.5)

## 2021-02-12 ENCOUNTER — Inpatient Hospital Stay (HOSPITAL_BASED_OUTPATIENT_CLINIC_OR_DEPARTMENT_OTHER): Payer: Medicare Other | Admitting: Hematology

## 2021-02-12 ENCOUNTER — Other Ambulatory Visit: Payer: Self-pay

## 2021-02-12 VITALS — BP 117/67 | HR 63 | Temp 97.6°F | Resp 18 | Wt 235.6 lb

## 2021-02-12 DIAGNOSIS — E11621 Type 2 diabetes mellitus with foot ulcer: Secondary | ICD-10-CM | POA: Diagnosis not present

## 2021-02-12 DIAGNOSIS — N1832 Chronic kidney disease, stage 3b: Secondary | ICD-10-CM | POA: Diagnosis not present

## 2021-02-12 DIAGNOSIS — E1122 Type 2 diabetes mellitus with diabetic chronic kidney disease: Secondary | ICD-10-CM | POA: Diagnosis not present

## 2021-02-12 DIAGNOSIS — D472 Monoclonal gammopathy: Secondary | ICD-10-CM | POA: Diagnosis not present

## 2021-02-12 DIAGNOSIS — I129 Hypertensive chronic kidney disease with stage 1 through stage 4 chronic kidney disease, or unspecified chronic kidney disease: Secondary | ICD-10-CM | POA: Diagnosis not present

## 2021-02-12 DIAGNOSIS — C61 Malignant neoplasm of prostate: Secondary | ICD-10-CM | POA: Diagnosis not present

## 2021-02-12 NOTE — Progress Notes (Signed)
[name] [name], [name] [name] [id number]   CLINIC:  Medical Oncology/Hematology  PCP:  Monico Blitz, [place] [address]  [phone]  REASON FOR VISIT:  Follow-up for MGUS  PRIOR THERAPY: None  CURRENT THERAPY: Observation  INTERVAL HISTORY:  [name] [name], a 75 y.o. male, returns for routine follow-up for his MGUS. [name] [name] was last seen on 08/07/2020.  Today he is accompanied by his sister and he reports feeling well. He complains of having instability on his right foot due to an ulcer on the outside of his foot which is taking a long time to heal.   REVIEW OF SYSTEMS:  Review of Systems  Constitutional: Positive for fatigue (75%). Negative for appetite change.  Musculoskeletal: Positive for gait problem (d/t recurrent gangrene in R foot).  Neurological: Positive for gait problem (d/t recurrent gangrene in R foot).  All other systems reviewed and are negative.   PAST MEDICAL/SURGICAL HISTORY:  Past Medical History:  Diagnosis Date  . Anemia   . Anxiety   . Cancer [facility])    Prostate- Stage I  . Critical lower limb ischemia ([place])   . Essential hypertension   . Gangrene of foot [facility]) March 2016   Left foot  . Hyperlipidemia   . Neuropathy   . Type 2 diabetes mellitus ([place])    Past Surgical History:  Procedure Laterality Date  . AMPUTATION Left 01/12/2015   Procedure: AMPUTATION BELOW KNEE;  Surgeon: Leandrew Koyanagi, MD;  Location: [facility] [place];  Service: Orthopedics;  Laterality: Left;  . APPLICATION OF WOUND VAC Right 01/12/2015   Procedure: APPLICATION OF WOUND VAC;  Surgeon: Leandrew Koyanagi, MD;  Location: [place];  Service: Orthopedics;  Laterality: Right;  . CARDIAC ELECTROPHYSIOLOGY STUDY AND ABLATION    . CHOLECYSTECTOMY    . ORIF HUMERUS FRACTURE      SOCIAL HISTORY:  Social History   Socioeconomic History  . Marital status: Single    Spouse name: Not on file  . Number of children: 0  . Years of education: Not on file   . Highest education level: Not on file  Occupational History  . Occupation: RETIRED  Tobacco Use  . Smoking status: Never Smoker  . Smokeless tobacco: Never Used  Vaping Use  . Vaping Use: Never used  Substance and Sexual Activity  . Alcohol use: Yes    Alcohol/week: 0.0 standard drinks    Comment: rare  . Drug use: No  . Sexual activity: Not Currently  Other Topics Concern  . Not on file  Social History Narrative  . Not on file   Social Determinants of Health   Financial Resource Strain: Medium Risk  . Difficulty of Paying Living Expenses: Somewhat hard  Food Insecurity: Food Insecurity Present  . Worried About Charity fundraiser in the Last Year: Sometimes true  . Ran Out of Food in the Last Year: Sometimes true  Transportation Needs: Unmet Transportation Needs  . Lack of Transportation (Medical): Yes  . Lack of Transportation (Non-Medical): Yes  Physical Activity: Inactive  . Days of Exercise per Week: 0 days  . Minutes of Exercise per Session: 0 min  Stress: No Stress Concern Present  . Feeling of Stress : Not at all  Social Connections: Socially Isolated  . Frequency of Communication with Friends and Family: More than three times a week  . Frequency of Social Gatherings with Friends and Family: Twice a week  . Attends  Religious Services: Never  . Active Member of Clubs or Organizations: No  . Attends Archivist Meetings: Never  . Marital Status: Never married  Intimate Partner Violence: Not At Risk  . Fear of Current or Ex-Partner: No  . Emotionally Abused: No  . Physically Abused: No  . Sexually Abused: No    FAMILY HISTORY:  Family History  Problem Relation Age of Onset  . Hypertension Father        and kidney failure  . Heart disease Father   . Heart attack Father   . Kidney disease Father   . Alzheimer's disease Mother   . Heart attack Maternal Grandfather   . Heart attack Paternal Grandfather   . Colon cancer Paternal Aunt 39        s/p surgical rsxn; lived to 75 yrs old  . Hypertension Sister   . Hypercholesterolemia Sister   . Cervical cancer Sister   . Gastric cancer Neg Hx   . Esophageal cancer Neg Hx     CURRENT MEDICATIONS:  Current Outpatient Medications  Medication Sig Dispense Refill  . ALPRAZolam (XANAX) 1 MG tablet Take 1.5 mg by mouth at bedtime.    Marland Kitchen ascorbic acid (VITAMIN C) 1000 MG tablet Take 1,000 mg by mouth daily in the afternoon.     . cadexomer iodine (IODOSORB) 0.9 % gel Apply 1 application topically daily as needed for wound care.     . Calcium Carbonate (CALCIUM 600 PO) Take 600 mg by mouth daily in the afternoon.    . finasteride (PROSCAR) 5 MG tablet Take 5 mg by mouth daily in the afternoon.     . furosemide (LASIX) 40 MG tablet Take 40 mg by mouth 2 (two) times daily.    Marland Kitchen gabapentin (NEURONTIN) 300 MG capsule Take 600 mg by mouth 2 (two) times daily as needed (pain.).     Marland Kitchen glimepiride (AMARYL) 4 MG tablet Take 4 mg by mouth daily in the afternoon.     . hydrALAZINE (APRESOLINE) 100 MG tablet Take 50 mg by mouth in the morning and at bedtime.    Marland Kitchen HYDROcodone-acetaminophen (NORCO/VICODIN) 5-325 MG tablet Take 1 tablet by mouth 2 (two) times daily as needed.    Marland Kitchen losartan (COZAAR) 25 MG tablet Take 25 mg by mouth daily.    . metoprolol (LOPRESSOR) 100 MG tablet Take 100 mg by mouth 2 (two) times daily.    . Multiple Vitamins-Minerals (MULTIVITAMIN WITH MINERALS) tablet Take 1 tablet by mouth daily in the afternoon.     . Omega-3 Fatty Acids (FISH OIL) 1200 MG CAPS Take 1,200 mg by mouth daily in the afternoon.    . potassium chloride (KLOR-CON) 10 MEQ tablet Take 10 mEq by mouth daily.    . sildenafil (REVATIO) 20 MG tablet Take by mouth.    . simvastatin (ZOCOR) 40 MG tablet Take 40 mg by mouth daily in the afternoon.     . tamsulosin (FLOMAX) 0.4 MG CAPS capsule Take 0.4 mg by mouth daily in the afternoon.     . vitamin E 400 UNIT capsule Take 400 Units by mouth daily.     No current  facility-administered medications for this visit.    ALLERGIES:  No Known Allergies  PHYSICAL EXAM:  Performance status (ECOG): 1 - Symptomatic but completely ambulatory  Vitals:   02/12/21 1500  BP: 117/67  Pulse: 63  Resp: 18  Temp: 97.6 F (36.4 C)  SpO2: 96%   Wt Readings from Last 3  Encounters:  02/12/21 235 lb 9.6 oz (106.9 kg)  01/30/21 234 lb 4.8 oz (106.3 kg)  08/07/20 236 lb 8 oz (107.3 kg)   Physical Exam Vitals reviewed.  Constitutional:      Appearance: Normal appearance. He is obese.  Cardiovascular:     Rate and Rhythm: Normal rate and regular rhythm.     Pulses: Normal pulses.     Heart sounds: Normal heart sounds.  Pulmonary:     Effort: Pulmonary effort is normal.     Breath sounds: Normal breath sounds.  Musculoskeletal:     Right lower leg: Edema present.  Neurological:     General: No focal deficit present.     Mental Status: He is alert and oriented to person, place, and time.  Psychiatric:        Mood and Affect: Mood normal.        Behavior: Behavior normal.     LABORATORY DATA:  I have reviewed the labs as listed.  CBC Latest Ref Rng & Units 02/05/2021 07/31/2020 03/14/2020  WBC 4.0 - 10.5 K/uL 10.5 10.1 9.6  Hemoglobin 13.0 - 17.0 g/dL 15.2 14.2 15.0  Hematocrit 39.0 - 52.0 % 47.0 42.8 47.4  Platelets 150 - 400 K/uL 210 216 228   CMP Latest Ref Rng & Units 02/05/2021 07/31/2020 03/13/2020  Glucose 70 - 99 mg/dL 122(H) 93 138(H)  BUN 8 - 23 mg/dL 27(H) 23 52(H)  Creatinine 0.61 - 1.24 mg/dL 1.60(H) 1.82(H) 2.22(H)  Sodium 135 - 145 mmol/L 139 135 138  Potassium 3.5 - 5.1 mmol/L 3.7 3.8 3.7  Chloride 98 - 111 mmol/L 98 99 99  CO2 22 - 32 mmol/L 28 27 27   Calcium 8.9 - 10.3 mg/dL 8.8(L) 9.1 9.3  Total Protein 6.5 - 8.1 g/dL 6.8 6.5 7.1  Total Bilirubin 0.3 - 1.2 mg/dL 0.6 0.8 0.8  Alkaline Phos 38 - 126 U/L 56 55 63  AST 15 - 41 U/L 17 15 26   ALT 0 - 44 U/L 18 13 26       Component Value Date/Time   RBC 5.02 02/05/2021 1316    MCV 93.6 02/05/2021 1316   MCH 30.3 02/05/2021 1316   MCHC 32.3 02/05/2021 1316   RDW 14.6 02/05/2021 1316   LYMPHSABS 2.6 02/05/2021 1316   MONOABS 0.9 02/05/2021 1316   EOSABS 0.5 02/05/2021 1316   BASOSABS 0.1 02/05/2021 1316   Lab Results  Component Value Date   TOTALPROTELP 6.4 02/05/2021   TOTALPROTELP 6.6 02/05/2021   ALBUMINELP 3.8 02/05/2021   A1GS 0.3 02/05/2021   A2GS 0.9 02/05/2021   BETS 0.9 02/05/2021   GAMS 0.8 02/05/2021   MSPIKE Not Observed 02/05/2021   SPEI Comment 02/05/2021    Lab Results  Component Value Date   KPAFRELGTCHN 65.7 (H) 02/05/2021   LAMBDASER 31.2 (H) 02/05/2021   KAPLAMBRATIO 2.11 (H) 02/05/2021    DIAGNOSTIC IMAGING:  I have independently reviewed the scans and discussed with the patient. VAS [place] ABI WITH/WO TBI  Result Date: 01/30/2021 LOWER EXTREMITY DOPPLER STUDY Indications: Ulceration, and peripheral artery disease. High Risk Factors: Hypertension, hyperlipidemia, Diabetes.  Vascular Interventions: Left below knee amputation. Comparison Study: 10/08/2017: Rt ABI 1.36 (triphasic waveforms) / toe could not                   be evaluated due to open wound; Lt BKA. Performing Technologist: Ivan Croft  Examination Guidelines: A complete evaluation includes at minimum, Doppler waveform signals and systolic blood pressure reading at  the level of bilateral brachial, anterior tibial, and posterior tibial arteries, when vessel segments are accessible. Bilateral testing is considered an integral part of a complete examination. Photoelectric Plethysmograph (PPG) waveforms and toe systolic pressure readings are included as required and additional duplex testing as needed. Limited examinations for reoccurring indications may be performed as noted.  ABI Findings: +---------+------------------+-----+---------+--------+ Right    Rt Pressure (mmHg)IndexWaveform Comment  +---------+------------------+-----+---------+--------+ Brachial 126                                       +---------+------------------+-----+---------+--------+ PTA      136               1.08 triphasic         +---------+------------------+-----+---------+--------+ DP       147               1.17 triphasic         +---------+------------------+-----+---------+--------+ Matthew Folks               1.13                   +---------+------------------+-----+---------+--------+ +--------+------------------+-----+--------+-------+ Left    Lt Pressure (mmHg)IndexWaveformComment +--------+------------------+-----+--------+-------+ [id number]                                    +--------+------------------+-----+--------+-------+ +-------+-----------+-----------+------------+----------------------------+ ABI/TBIToday's ABIToday's TBIPrevious ABIPrevious TBI                 +-------+-----------+-----------+------------+----------------------------+ Right  1.17       1.13       1.36        Could no obtain due to wound +-------+-----------+-----------+------------+----------------------------+ Left   BKA        BKA        BKA         BKA                          +-------+-----------+-----------+------------+----------------------------+   Summary: Right: Resting right ankle-brachial index is within normal range. No evidence of significant right lower extremity arterial disease. The right toe-brachial index is normal.  *See table(s) above for measurements and observations.  Electronically signed by Deitra Mayo MD on 01/30/2021 at 12:46:07 PM.    Final      ASSESSMENT:  1. Monoclonal gammopathy: -Patient seen at the request of Dr. Theador Hawthorne for further work-up of monoclonal gammopathy. UPEP on 11/25/2019 showed poorly defined area of restricted protein mobility reactive to IgG and kappa antisera. -Labs from 03/13/2020 shows M spike is not seen. Free light chain ratio is elevated at 3.31. Kappa light chain 67.6, lambda light chains 20.4.  Immunofixation was unremarkable. -Skeletal survey on 03/13/2020 with no lucent lesions of myeloma.  2. CKD: -CKD stage IIIb since 2016 secondary to diabetes and hypertension. He has nephrotic range proteinuria. -Ultrasound of the kidneys on 12/06/2019 shows bilateral renal cyst with no obstructing lesions. Renal cortical thickness and renal echogenicity normal bilaterally. -Kidney biopsy on 03/14/2020 consistent with arteriosclerosis with arterionephrosclerosis and FSGS.   PLAN:  1.  Elevated free light chains: -We reviewed his myeloma labs.  Free light chain ratio improved to 2.11 from 2.38.  Kappa light chains are 65.7 and stable.  Lambda light chains are 31.2. - Serum immunofixation and SPEP are normal. - No hypercalcemia or anemia. -  No active intervention necessary.  RTC 6 months for follow-up with repeat labs.  2. CKD: -Creatinine has improved to 1.6 from 1.82 previously. - Continue follow-up with Dr. Theador Hawthorne.  3.  Prostate cancer: -He follows up with Dr. Brendia Sacks at [facility] and is under watchful waiting.  Orders placed this encounter:  No orders of the defined types were placed in this encounter.    Derek Jack, MD [place] [phone]   I, Milinda Antis, am acting as a scribe for Dr. Sanda Linger.  I, Derek Jack MD, have reviewed the above documentation for accuracy and completeness, and I agree with the above.

## 2021-02-12 NOTE — Patient Instructions (Signed)
[place] at [facility] Discharge Instructions  You were seen today by Dr. Delton Coombes. He went over your recent results. Drink plenty of water to keep your kidneys flushed. Dr. Delton Coombes will see you back in 6 months for labs and follow up.   Thank you for choosing [place] at [facility] to provide your oncology and hematology care.  To afford each patient quality time with our provider, please arrive at least 15 minutes before your scheduled appointment time.   If you have a lab appointment with the [facility] please come in thru the Main Entrance and check in at the main information desk  You need to re-schedule your appointment should you arrive 10 or more minutes late.  We strive to give you quality time with our providers, and arriving late affects you and other patients whose appointments are after yours.  Also, if you no show three or more times for appointments you may be dismissed from the clinic at the providers discretion.     Again, thank you for choosing [facility].  Our hope is that these requests will decrease the amount of time that you wait before being seen by our physicians.       _____________________________________________________________  Should you have questions after your visit to [facility], please contact our office at [phone] between the hours of 8:00 a.m. and 4:30 p.m.  Voicemails left after 4:00 p.m. will not be returned until the following business day.  For prescription refill requests, have your pharmacy contact our office and allow 72 hours.    Cancer Center Support Programs:   > Cancer Support Group  2nd Tuesday of the month 1pm-2pm, Journey Room

## 2021-02-20 DIAGNOSIS — M869 Osteomyelitis, unspecified: Secondary | ICD-10-CM | POA: Diagnosis not present

## 2021-02-20 DIAGNOSIS — N184 Chronic kidney disease, stage 4 (severe): Secondary | ICD-10-CM | POA: Diagnosis not present

## 2021-02-20 DIAGNOSIS — E1122 Type 2 diabetes mellitus with diabetic chronic kidney disease: Secondary | ICD-10-CM | POA: Diagnosis not present

## 2021-02-20 DIAGNOSIS — E1165 Type 2 diabetes mellitus with hyperglycemia: Secondary | ICD-10-CM | POA: Diagnosis not present

## 2021-02-20 DIAGNOSIS — Z6835 Body mass index (BMI) 35.0-35.9, adult: Secondary | ICD-10-CM | POA: Diagnosis not present

## 2021-02-20 DIAGNOSIS — Z299 Encounter for prophylactic measures, unspecified: Secondary | ICD-10-CM | POA: Diagnosis not present

## 2021-02-20 DIAGNOSIS — I10 Essential (primary) hypertension: Secondary | ICD-10-CM | POA: Diagnosis not present

## 2021-02-27 DIAGNOSIS — E1140 Type 2 diabetes mellitus with diabetic neuropathy, unspecified: Secondary | ICD-10-CM | POA: Diagnosis not present

## 2021-02-27 DIAGNOSIS — E1151 Type 2 diabetes mellitus with diabetic peripheral angiopathy without gangrene: Secondary | ICD-10-CM | POA: Diagnosis not present

## 2021-02-27 DIAGNOSIS — L89892 Pressure ulcer of other site, stage 2: Secondary | ICD-10-CM | POA: Diagnosis not present

## 2021-03-14 DIAGNOSIS — Z299 Encounter for prophylactic measures, unspecified: Secondary | ICD-10-CM | POA: Diagnosis not present

## 2021-03-14 DIAGNOSIS — L0291 Cutaneous abscess, unspecified: Secondary | ICD-10-CM | POA: Diagnosis not present

## 2021-03-14 DIAGNOSIS — I10 Essential (primary) hypertension: Secondary | ICD-10-CM | POA: Diagnosis not present

## 2021-03-14 DIAGNOSIS — Z6835 Body mass index (BMI) 35.0-35.9, adult: Secondary | ICD-10-CM | POA: Diagnosis not present

## 2021-03-14 DIAGNOSIS — E1165 Type 2 diabetes mellitus with hyperglycemia: Secondary | ICD-10-CM | POA: Diagnosis not present

## 2021-03-14 DIAGNOSIS — I4892 Unspecified atrial flutter: Secondary | ICD-10-CM | POA: Diagnosis not present

## 2021-03-20 DIAGNOSIS — L89892 Pressure ulcer of other site, stage 2: Secondary | ICD-10-CM | POA: Diagnosis not present

## 2021-03-20 DIAGNOSIS — E1151 Type 2 diabetes mellitus with diabetic peripheral angiopathy without gangrene: Secondary | ICD-10-CM | POA: Diagnosis not present

## 2021-03-20 DIAGNOSIS — E1140 Type 2 diabetes mellitus with diabetic neuropathy, unspecified: Secondary | ICD-10-CM | POA: Diagnosis not present

## 2021-03-20 DIAGNOSIS — M79671 Pain in right foot: Secondary | ICD-10-CM | POA: Diagnosis not present

## 2021-03-21 DIAGNOSIS — I129 Hypertensive chronic kidney disease with stage 1 through stage 4 chronic kidney disease, or unspecified chronic kidney disease: Secondary | ICD-10-CM | POA: Diagnosis not present

## 2021-03-21 DIAGNOSIS — E211 Secondary hyperparathyroidism, not elsewhere classified: Secondary | ICD-10-CM | POA: Diagnosis not present

## 2021-03-21 DIAGNOSIS — N041 Nephrotic syndrome with focal and segmental glomerular lesions: Secondary | ICD-10-CM | POA: Diagnosis not present

## 2021-03-21 DIAGNOSIS — R809 Proteinuria, unspecified: Secondary | ICD-10-CM | POA: Diagnosis not present

## 2021-03-28 DIAGNOSIS — Z299 Encounter for prophylactic measures, unspecified: Secondary | ICD-10-CM | POA: Diagnosis not present

## 2021-03-28 DIAGNOSIS — E1142 Type 2 diabetes mellitus with diabetic polyneuropathy: Secondary | ICD-10-CM | POA: Diagnosis not present

## 2021-03-28 DIAGNOSIS — I129 Hypertensive chronic kidney disease with stage 1 through stage 4 chronic kidney disease, or unspecified chronic kidney disease: Secondary | ICD-10-CM | POA: Diagnosis not present

## 2021-03-28 DIAGNOSIS — N041 Nephrotic syndrome with focal and segmental glomerular lesions: Secondary | ICD-10-CM | POA: Diagnosis not present

## 2021-03-28 DIAGNOSIS — I10 Essential (primary) hypertension: Secondary | ICD-10-CM | POA: Diagnosis not present

## 2021-03-28 DIAGNOSIS — Z6835 Body mass index (BMI) 35.0-35.9, adult: Secondary | ICD-10-CM | POA: Diagnosis not present

## 2021-03-28 DIAGNOSIS — L0291 Cutaneous abscess, unspecified: Secondary | ICD-10-CM | POA: Diagnosis not present

## 2021-03-28 DIAGNOSIS — E211 Secondary hyperparathyroidism, not elsewhere classified: Secondary | ICD-10-CM | POA: Diagnosis not present

## 2021-03-28 DIAGNOSIS — R809 Proteinuria, unspecified: Secondary | ICD-10-CM | POA: Diagnosis not present

## 2021-03-28 DIAGNOSIS — E1165 Type 2 diabetes mellitus with hyperglycemia: Secondary | ICD-10-CM | POA: Diagnosis not present

## 2021-03-28 DIAGNOSIS — R600 Localized edema: Secondary | ICD-10-CM | POA: Diagnosis not present

## 2021-03-31 DIAGNOSIS — M4602 Spinal enthesopathy, cervical region: Secondary | ICD-10-CM | POA: Diagnosis not present

## 2021-03-31 DIAGNOSIS — E7800 Pure hypercholesterolemia, unspecified: Secondary | ICD-10-CM | POA: Diagnosis not present

## 2021-03-31 DIAGNOSIS — R9431 Abnormal electrocardiogram [ECG] [EKG]: Secondary | ICD-10-CM | POA: Diagnosis not present

## 2021-03-31 DIAGNOSIS — M86672 Other chronic osteomyelitis, left ankle and foot: Secondary | ICD-10-CM | POA: Diagnosis not present

## 2021-03-31 DIAGNOSIS — E876 Hypokalemia: Secondary | ICD-10-CM | POA: Diagnosis not present

## 2021-03-31 DIAGNOSIS — M436 Torticollis: Secondary | ICD-10-CM | POA: Diagnosis not present

## 2021-03-31 DIAGNOSIS — I129 Hypertensive chronic kidney disease with stage 1 through stage 4 chronic kidney disease, or unspecified chronic kidney disease: Secondary | ICD-10-CM | POA: Diagnosis not present

## 2021-03-31 DIAGNOSIS — G9389 Other specified disorders of brain: Secondary | ICD-10-CM | POA: Diagnosis not present

## 2021-03-31 DIAGNOSIS — M47812 Spondylosis without myelopathy or radiculopathy, cervical region: Secondary | ICD-10-CM | POA: Diagnosis not present

## 2021-03-31 DIAGNOSIS — E1169 Type 2 diabetes mellitus with other specified complication: Secondary | ICD-10-CM | POA: Diagnosis not present

## 2021-03-31 DIAGNOSIS — R079 Chest pain, unspecified: Secondary | ICD-10-CM | POA: Diagnosis not present

## 2021-03-31 DIAGNOSIS — I4891 Unspecified atrial fibrillation: Secondary | ICD-10-CM | POA: Diagnosis not present

## 2021-03-31 DIAGNOSIS — N189 Chronic kidney disease, unspecified: Secondary | ICD-10-CM | POA: Diagnosis not present

## 2021-03-31 DIAGNOSIS — I6782 Cerebral ischemia: Secondary | ICD-10-CM | POA: Diagnosis not present

## 2021-03-31 DIAGNOSIS — I6523 Occlusion and stenosis of bilateral carotid arteries: Secondary | ICD-10-CM | POA: Diagnosis not present

## 2021-03-31 DIAGNOSIS — I959 Hypotension, unspecified: Secondary | ICD-10-CM | POA: Diagnosis not present

## 2021-03-31 DIAGNOSIS — I517 Cardiomegaly: Secondary | ICD-10-CM | POA: Diagnosis not present

## 2021-03-31 DIAGNOSIS — I509 Heart failure, unspecified: Secondary | ICD-10-CM | POA: Diagnosis not present

## 2021-03-31 DIAGNOSIS — M542 Cervicalgia: Secondary | ICD-10-CM | POA: Diagnosis not present

## 2021-03-31 DIAGNOSIS — I493 Ventricular premature depolarization: Secondary | ICD-10-CM | POA: Diagnosis not present

## 2021-03-31 DIAGNOSIS — R52 Pain, unspecified: Secondary | ICD-10-CM | POA: Diagnosis not present

## 2021-03-31 DIAGNOSIS — Z8546 Personal history of malignant neoplasm of prostate: Secondary | ICD-10-CM | POA: Diagnosis not present

## 2021-03-31 DIAGNOSIS — M2578 Osteophyte, vertebrae: Secondary | ICD-10-CM | POA: Diagnosis not present

## 2021-03-31 DIAGNOSIS — G319 Degenerative disease of nervous system, unspecified: Secondary | ICD-10-CM | POA: Diagnosis not present

## 2021-03-31 DIAGNOSIS — J811 Chronic pulmonary edema: Secondary | ICD-10-CM | POA: Diagnosis not present

## 2021-03-31 DIAGNOSIS — R0902 Hypoxemia: Secondary | ICD-10-CM | POA: Diagnosis not present

## 2021-03-31 DIAGNOSIS — Z9114 Patient's other noncompliance with medication regimen: Secondary | ICD-10-CM | POA: Diagnosis not present

## 2021-03-31 DIAGNOSIS — E1165 Type 2 diabetes mellitus with hyperglycemia: Secondary | ICD-10-CM | POA: Diagnosis not present

## 2021-03-31 DIAGNOSIS — M62838 Other muscle spasm: Secondary | ICD-10-CM | POA: Diagnosis not present

## 2021-03-31 DIAGNOSIS — R0989 Other specified symptoms and signs involving the circulatory and respiratory systems: Secondary | ICD-10-CM | POA: Diagnosis not present

## 2021-03-31 DIAGNOSIS — Z89511 Acquired absence of right leg below knee: Secondary | ICD-10-CM | POA: Diagnosis not present

## 2021-03-31 DIAGNOSIS — E1122 Type 2 diabetes mellitus with diabetic chronic kidney disease: Secondary | ICD-10-CM | POA: Diagnosis not present

## 2021-04-03 DIAGNOSIS — E1140 Type 2 diabetes mellitus with diabetic neuropathy, unspecified: Secondary | ICD-10-CM | POA: Diagnosis not present

## 2021-04-03 DIAGNOSIS — L89892 Pressure ulcer of other site, stage 2: Secondary | ICD-10-CM | POA: Diagnosis not present

## 2021-04-03 DIAGNOSIS — E1151 Type 2 diabetes mellitus with diabetic peripheral angiopathy without gangrene: Secondary | ICD-10-CM | POA: Diagnosis not present

## 2021-04-03 DIAGNOSIS — M79671 Pain in right foot: Secondary | ICD-10-CM | POA: Diagnosis not present

## 2021-04-09 DIAGNOSIS — Z713 Dietary counseling and surveillance: Secondary | ICD-10-CM | POA: Diagnosis not present

## 2021-04-09 DIAGNOSIS — M62838 Other muscle spasm: Secondary | ICD-10-CM | POA: Diagnosis not present

## 2021-04-09 DIAGNOSIS — I10 Essential (primary) hypertension: Secondary | ICD-10-CM | POA: Diagnosis not present

## 2021-04-09 DIAGNOSIS — Z299 Encounter for prophylactic measures, unspecified: Secondary | ICD-10-CM | POA: Diagnosis not present

## 2021-04-09 DIAGNOSIS — I739 Peripheral vascular disease, unspecified: Secondary | ICD-10-CM | POA: Diagnosis not present

## 2021-04-09 IMAGING — US US RENAL
1 series · 13 of 25 positions shown · non-contrast
Comparison: None.

CLINICAL DATA: Chronic renal disease.  Diabetes mellitus.

EXAM:
RENAL / URINARY TRACT ULTRASOUND COMPLETE

[Series 1: us renal · 0.26mm/px · 13 of 117 slices shown]
[im 1/117]
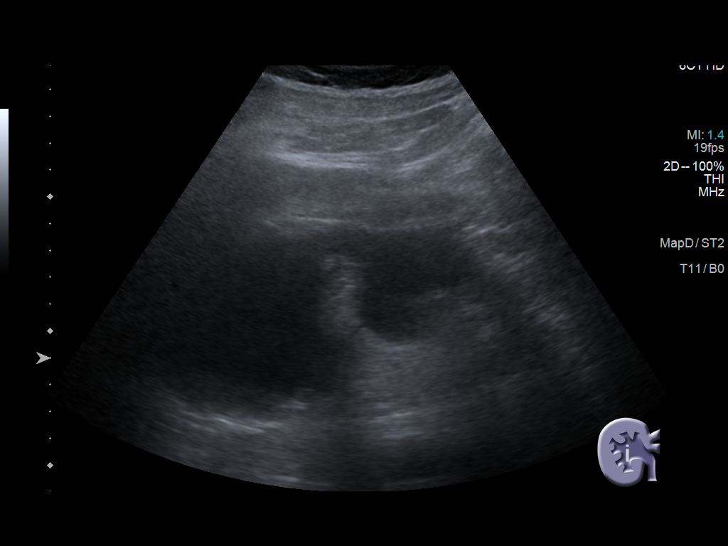
[im 10/117]
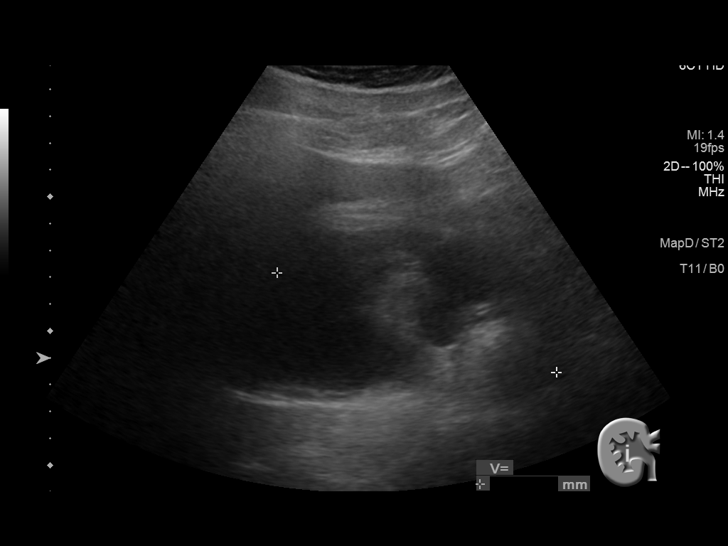
[im 20/117]
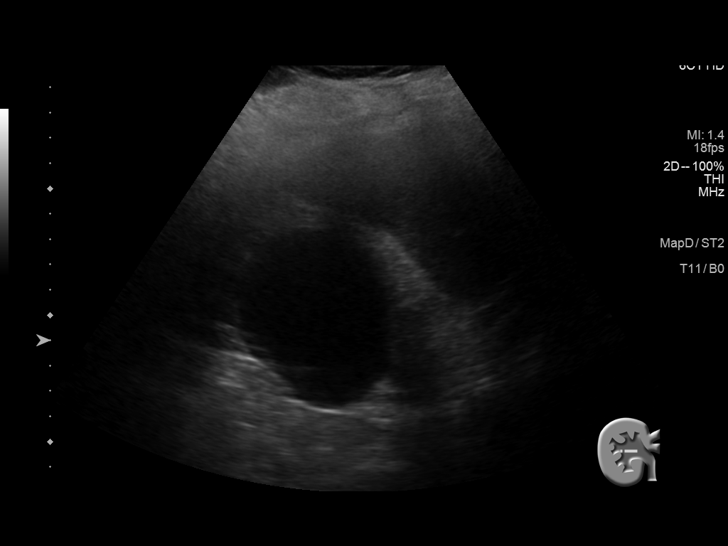
[im 30/117]
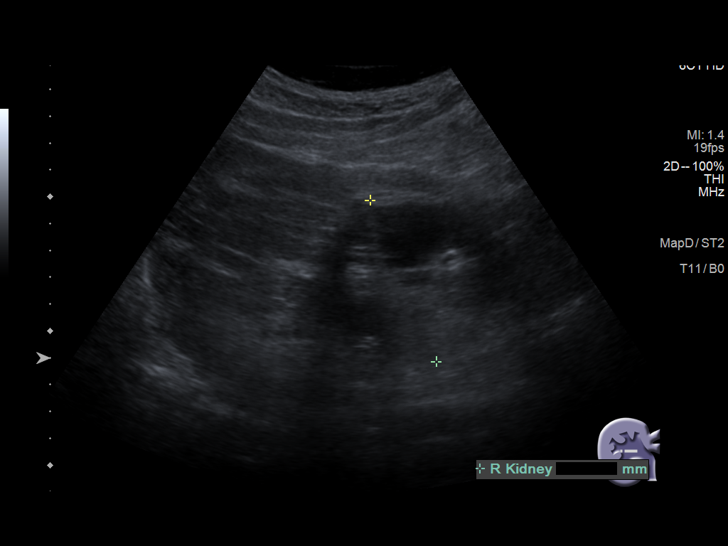
[im 39/117]
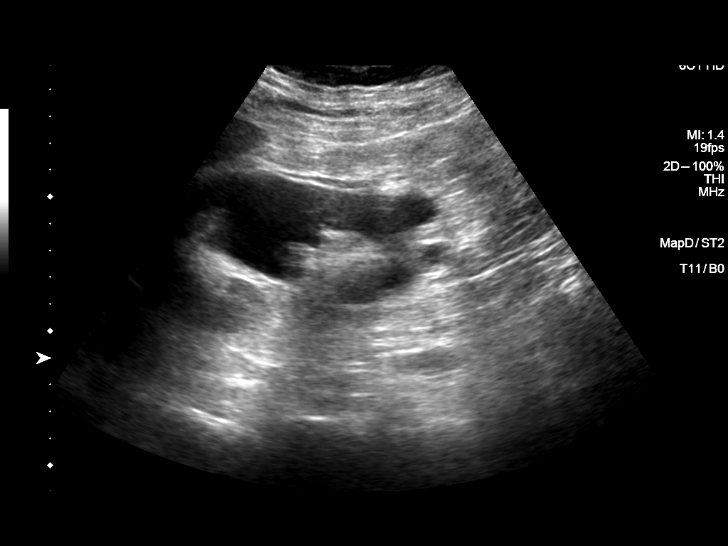
[im 49/117]
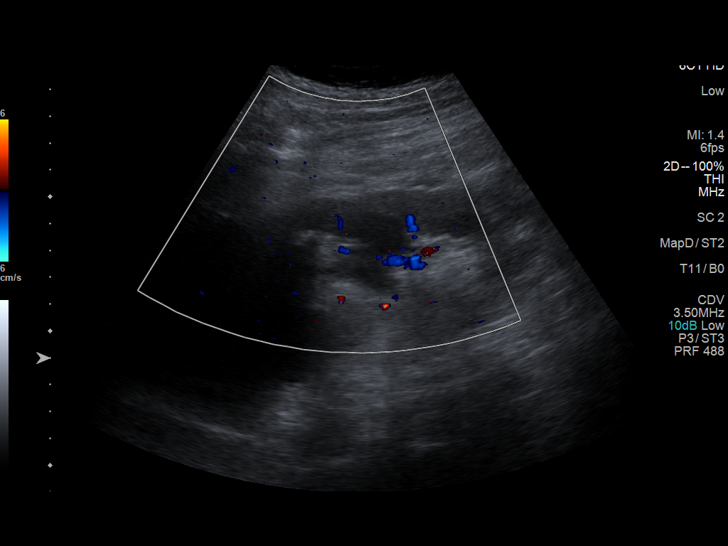
[im 59/117]
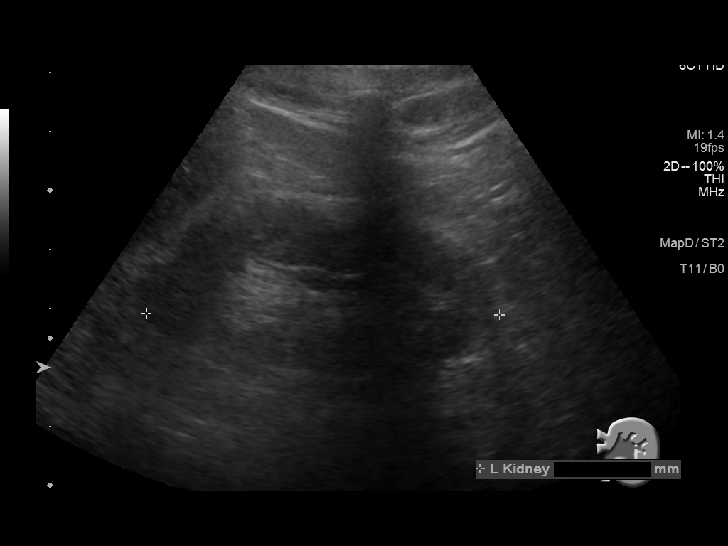
[im 68/117]
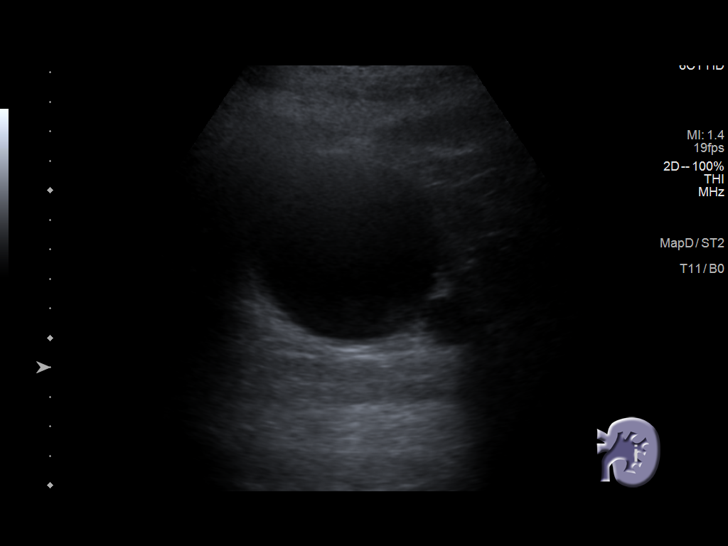
[im 78/117]
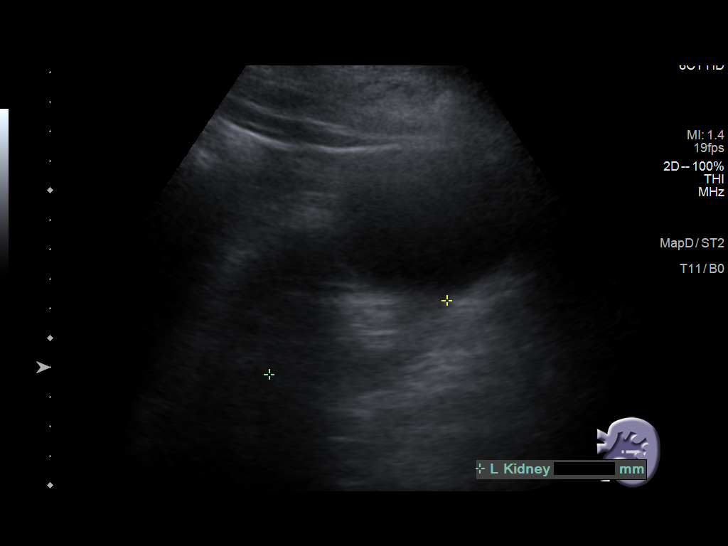
[im 88/117]
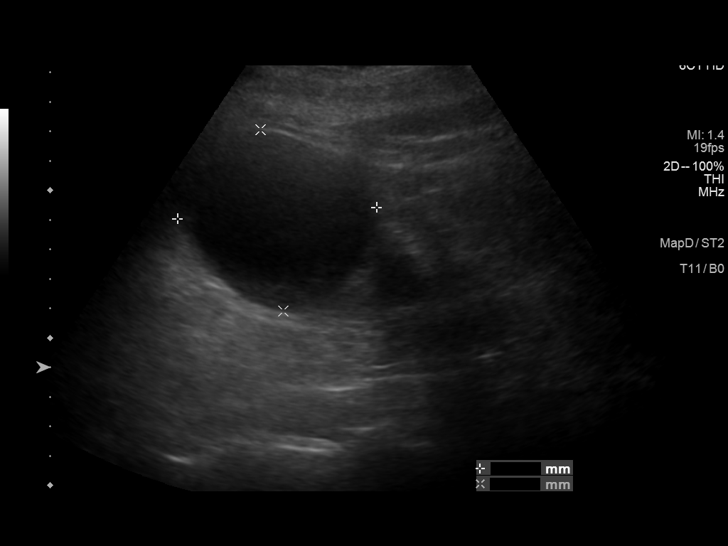
[im 97/117]
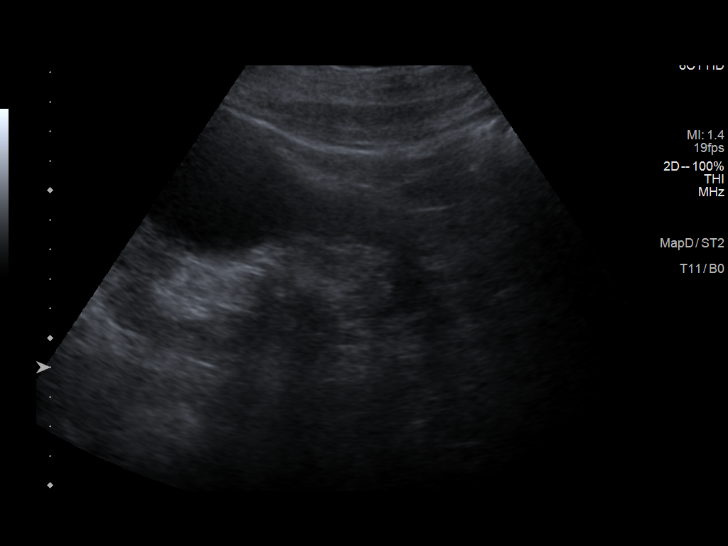
[im 107/117]
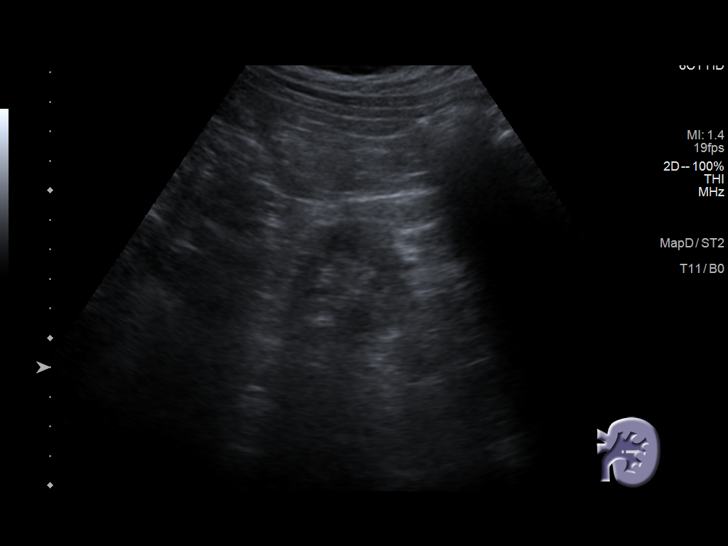
[im 117/117]
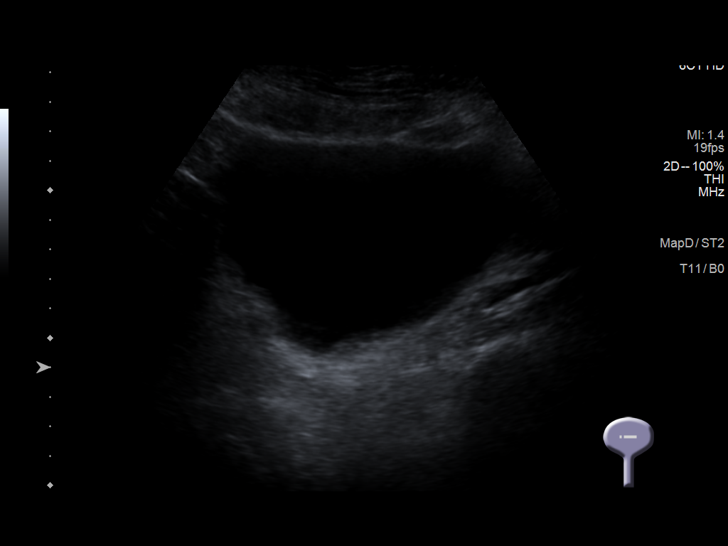

[13 of 25 positions shown; findings below may reference images not displayed]

FINDINGS: Right Kidney:

Renal measurements: 11.1 x 6.5 x 7.5 cm = volume: 256 mL .
Echogenicity and renal cortical thickness are within normal limits.
No perinephric fluid or hydronephrosis visualized. There are cysts
in the right kidney, largest arising in the upper pole region
measuring 7.4 x 4.5 x 3.0 cm. A cyst in the midportion of the kidney
measures 4.1 x 3.0 x 3.0 cm. A lower pole cyst measures 3.2 x 3.0 x
3.0 cm. No sonographically demonstrable calculus or ureterectasis.

Left Kidney:

Renal measurements: 11.2 x 6.5 x 6.4 cm = volume: 265 mL.
Echogenicity and renal cortical thickness are within normal limits.
No perinephric fluid or hydronephrosis visualized. There is a cyst
in the lateral mid to lower pole kidney region measuring 6.7 x 6.1 x
6.9 cm. A cyst in the mid left kidney measures 2.8 x 1.7 x 1.7 cm.
There is an apparent 6 mm calculus in the mid left kidney. No
ureterectasis.

Bladder:

Appears normal for degree of bladder distention.

Other:

None.
IMPRESSION: Renal cysts bilaterally. No obstructing focus in either kidney.
Renal cortical thickness and renal echogenicity normal bilaterally.

## 2021-05-01 DIAGNOSIS — E1151 Type 2 diabetes mellitus with diabetic peripheral angiopathy without gangrene: Secondary | ICD-10-CM | POA: Diagnosis not present

## 2021-05-01 DIAGNOSIS — E1140 Type 2 diabetes mellitus with diabetic neuropathy, unspecified: Secondary | ICD-10-CM | POA: Diagnosis not present

## 2021-05-01 DIAGNOSIS — L89892 Pressure ulcer of other site, stage 2: Secondary | ICD-10-CM | POA: Diagnosis not present

## 2021-05-01 DIAGNOSIS — M79671 Pain in right foot: Secondary | ICD-10-CM | POA: Diagnosis not present

## 2021-05-28 DIAGNOSIS — I10 Essential (primary) hypertension: Secondary | ICD-10-CM | POA: Diagnosis not present

## 2021-05-28 DIAGNOSIS — Z6835 Body mass index (BMI) 35.0-35.9, adult: Secondary | ICD-10-CM | POA: Diagnosis not present

## 2021-05-28 DIAGNOSIS — M1710 Unilateral primary osteoarthritis, unspecified knee: Secondary | ICD-10-CM | POA: Diagnosis not present

## 2021-05-28 DIAGNOSIS — M7711 Lateral epicondylitis, right elbow: Secondary | ICD-10-CM | POA: Diagnosis not present

## 2021-05-28 DIAGNOSIS — Z299 Encounter for prophylactic measures, unspecified: Secondary | ICD-10-CM | POA: Diagnosis not present

## 2021-05-28 DIAGNOSIS — E1165 Type 2 diabetes mellitus with hyperglycemia: Secondary | ICD-10-CM | POA: Diagnosis not present

## 2021-05-31 DIAGNOSIS — R600 Localized edema: Secondary | ICD-10-CM | POA: Diagnosis not present

## 2021-05-31 DIAGNOSIS — I129 Hypertensive chronic kidney disease with stage 1 through stage 4 chronic kidney disease, or unspecified chronic kidney disease: Secondary | ICD-10-CM | POA: Diagnosis not present

## 2021-05-31 DIAGNOSIS — N041 Nephrotic syndrome with focal and segmental glomerular lesions: Secondary | ICD-10-CM | POA: Diagnosis not present

## 2021-05-31 DIAGNOSIS — E211 Secondary hyperparathyroidism, not elsewhere classified: Secondary | ICD-10-CM | POA: Diagnosis not present

## 2021-05-31 DIAGNOSIS — R809 Proteinuria, unspecified: Secondary | ICD-10-CM | POA: Diagnosis not present

## 2021-06-05 DIAGNOSIS — N041 Nephrotic syndrome with focal and segmental glomerular lesions: Secondary | ICD-10-CM | POA: Diagnosis not present

## 2021-06-05 DIAGNOSIS — E211 Secondary hyperparathyroidism, not elsewhere classified: Secondary | ICD-10-CM | POA: Diagnosis not present

## 2021-06-05 DIAGNOSIS — I129 Hypertensive chronic kidney disease with stage 1 through stage 4 chronic kidney disease, or unspecified chronic kidney disease: Secondary | ICD-10-CM | POA: Diagnosis not present

## 2021-06-05 DIAGNOSIS — R809 Proteinuria, unspecified: Secondary | ICD-10-CM | POA: Diagnosis not present

## 2021-06-17 DIAGNOSIS — L89892 Pressure ulcer of other site, stage 2: Secondary | ICD-10-CM | POA: Diagnosis not present

## 2021-06-17 DIAGNOSIS — M79671 Pain in right foot: Secondary | ICD-10-CM | POA: Diagnosis not present

## 2021-06-17 DIAGNOSIS — E1151 Type 2 diabetes mellitus with diabetic peripheral angiopathy without gangrene: Secondary | ICD-10-CM | POA: Diagnosis not present

## 2021-06-17 DIAGNOSIS — E1140 Type 2 diabetes mellitus with diabetic neuropathy, unspecified: Secondary | ICD-10-CM | POA: Diagnosis not present

## 2021-07-04 DIAGNOSIS — E1151 Type 2 diabetes mellitus with diabetic peripheral angiopathy without gangrene: Secondary | ICD-10-CM | POA: Diagnosis not present

## 2021-07-04 DIAGNOSIS — E1140 Type 2 diabetes mellitus with diabetic neuropathy, unspecified: Secondary | ICD-10-CM | POA: Diagnosis not present

## 2021-07-04 DIAGNOSIS — L89892 Pressure ulcer of other site, stage 2: Secondary | ICD-10-CM | POA: Diagnosis not present

## 2021-07-04 DIAGNOSIS — M79671 Pain in right foot: Secondary | ICD-10-CM | POA: Diagnosis not present

## 2021-07-16 DIAGNOSIS — Z1331 Encounter for screening for depression: Secondary | ICD-10-CM | POA: Diagnosis not present

## 2021-07-16 DIAGNOSIS — Z23 Encounter for immunization: Secondary | ICD-10-CM | POA: Diagnosis not present

## 2021-07-16 DIAGNOSIS — Z299 Encounter for prophylactic measures, unspecified: Secondary | ICD-10-CM | POA: Diagnosis not present

## 2021-07-16 DIAGNOSIS — Z7189 Other specified counseling: Secondary | ICD-10-CM | POA: Diagnosis not present

## 2021-07-16 DIAGNOSIS — E782 Mixed hyperlipidemia: Secondary | ICD-10-CM | POA: Diagnosis not present

## 2021-07-16 DIAGNOSIS — I739 Peripheral vascular disease, unspecified: Secondary | ICD-10-CM | POA: Diagnosis not present

## 2021-07-16 DIAGNOSIS — Z1339 Encounter for screening examination for other mental health and behavioral disorders: Secondary | ICD-10-CM | POA: Diagnosis not present

## 2021-07-16 DIAGNOSIS — Z6836 Body mass index (BMI) 36.0-36.9, adult: Secondary | ICD-10-CM | POA: Diagnosis not present

## 2021-07-16 DIAGNOSIS — E1122 Type 2 diabetes mellitus with diabetic chronic kidney disease: Secondary | ICD-10-CM | POA: Diagnosis not present

## 2021-07-16 DIAGNOSIS — I10 Essential (primary) hypertension: Secondary | ICD-10-CM | POA: Diagnosis not present

## 2021-07-16 DIAGNOSIS — Z0000 Encounter for general adult medical examination without abnormal findings: Secondary | ICD-10-CM | POA: Diagnosis not present

## 2021-07-16 IMAGING — DX DG BONE SURVEY MET
8 of 10 series · 8 of 10 positions shown · non-contrast
Comparison: None.

CLINICAL DATA: Monoclonal gammopathy .

EXAM:
METASTATIC BONE SURVEY

[skull lat]
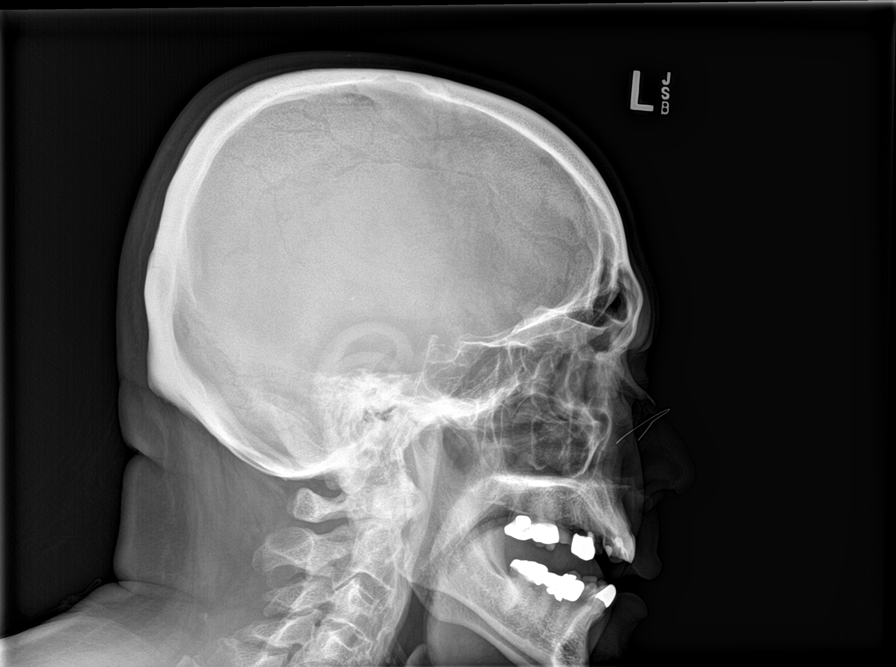

[shoulder ap (1 of 2)]
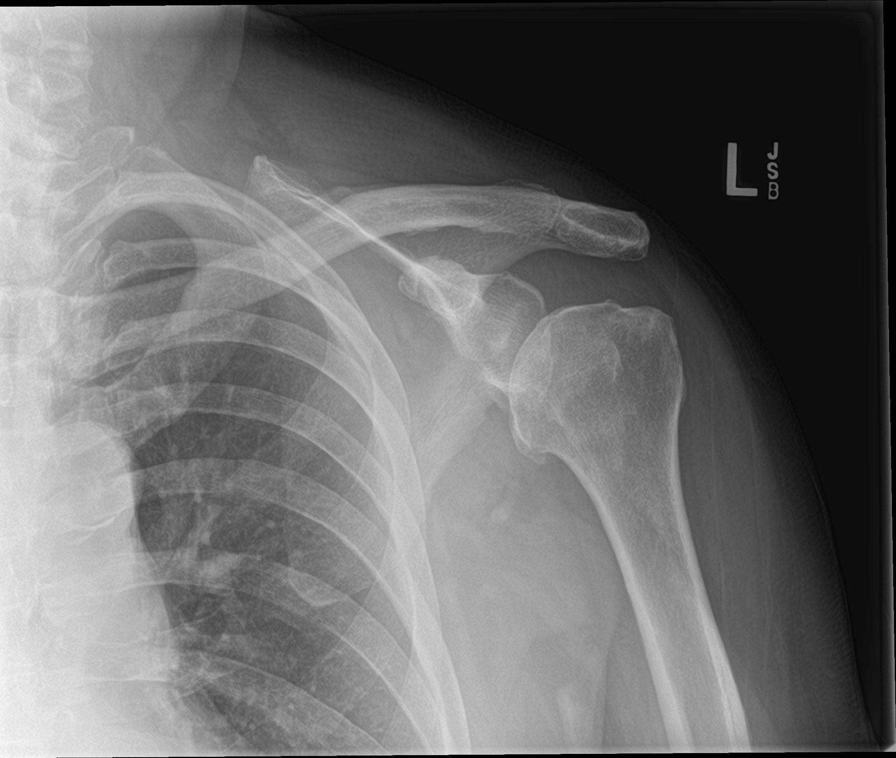

[shoulder ap (2 of 2)]
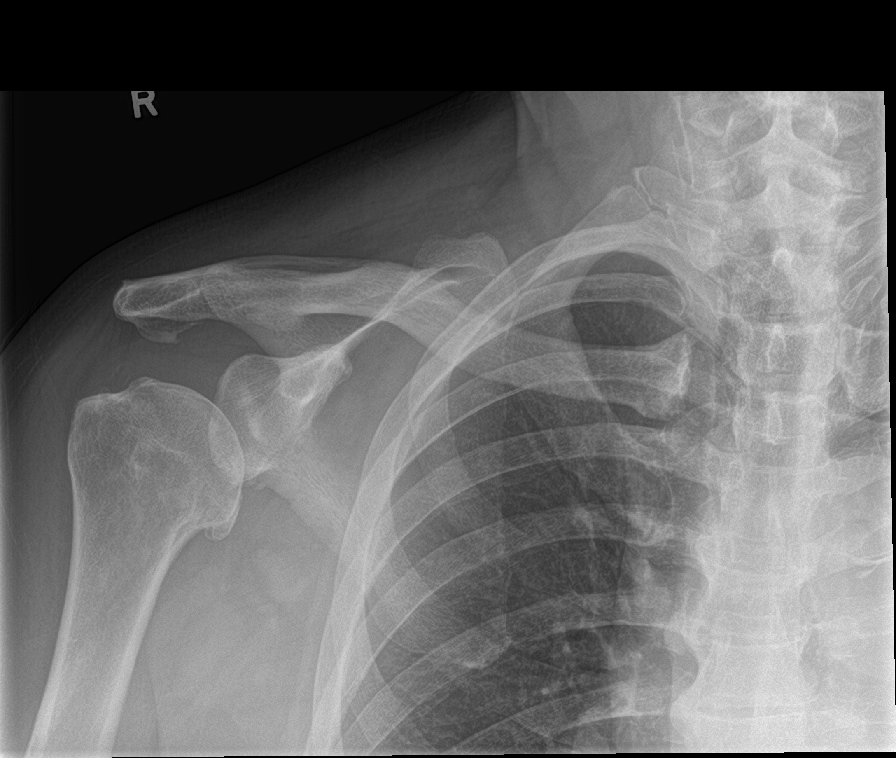

[humerus ap (1 of 2)]
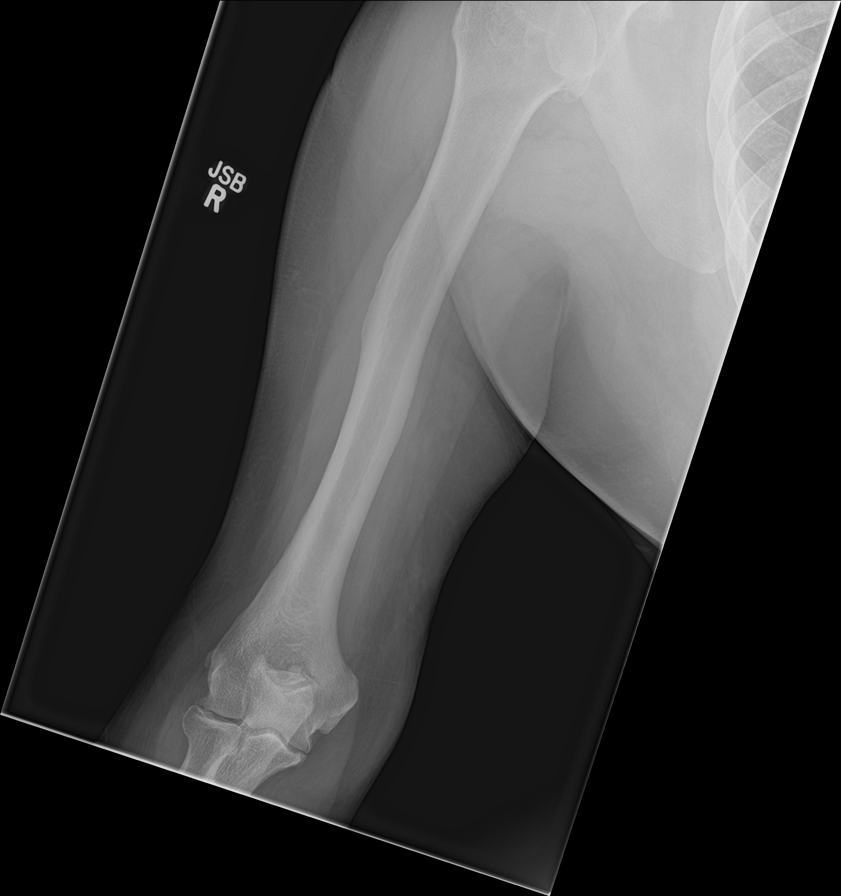

[humerus ap (2 of 2)]
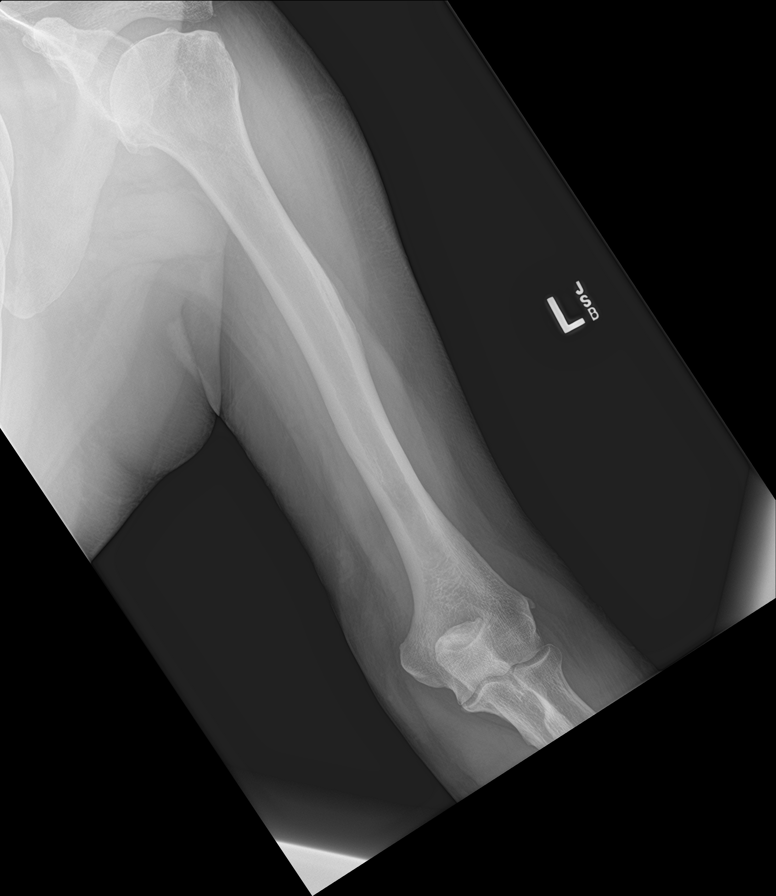

[forearm ap (1 of 2)]
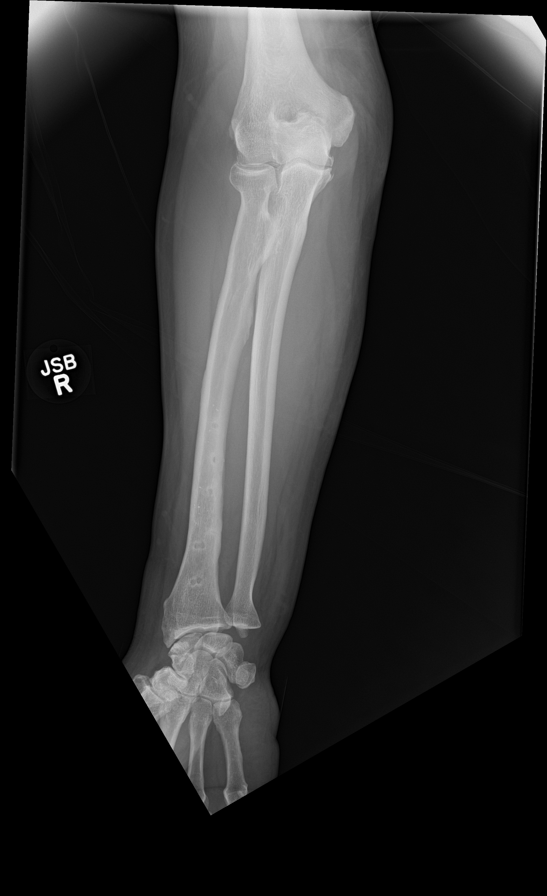

[forearm ap (2 of 2)]
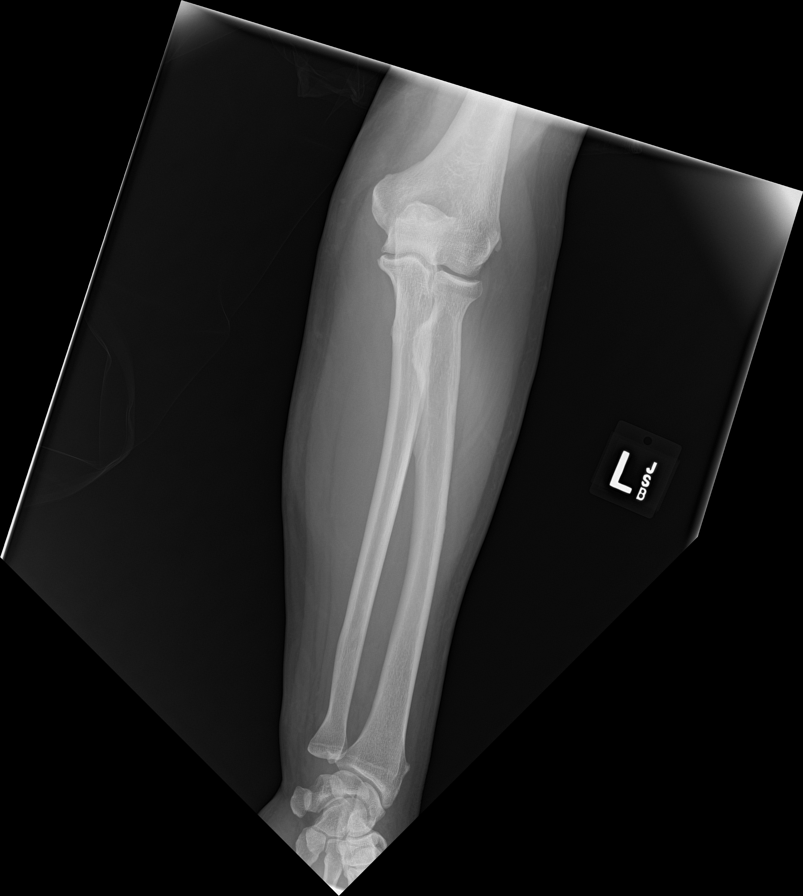

[c-spine ap]
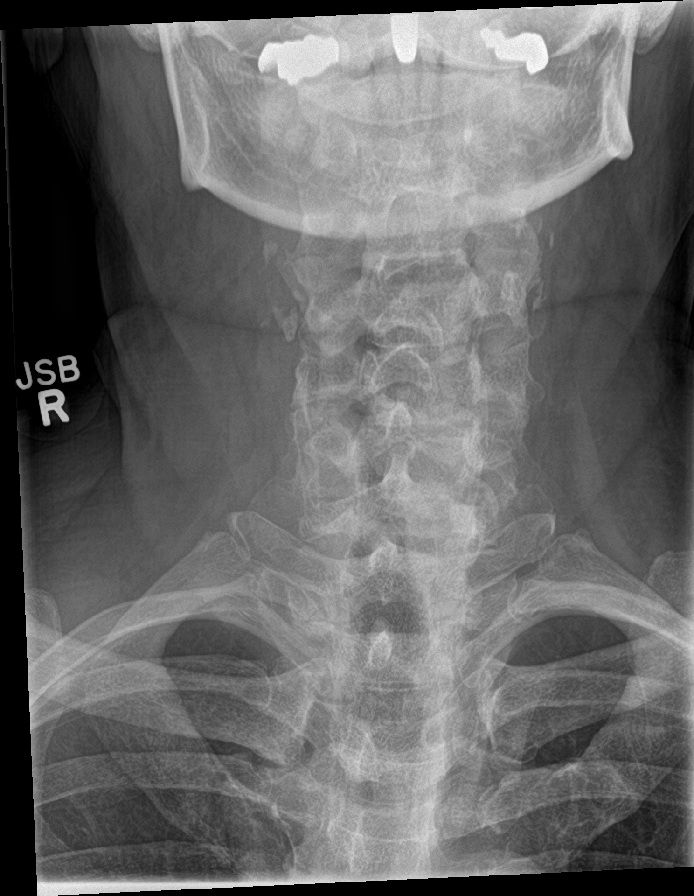

[8 of 10 positions shown; findings below may reference images not displayed]

FINDINGS: Peripherally sclerotic circular defects within the distal right
radius from previous plate fixation noted.

Large cervical spine ventral flowing syndesmophytes are identified
with maintenance of the disc spaces noted

Spondylosis noted throughout the thoracic spine.

Degenerative changes noted within both shoulder and both hip joints.

Status post left lower extremity below the knee amputation.

No lucent lesions of myeloma identified.
IMPRESSION: 1. No lucent lesions of myeloma identified
2. Osseous within the cervical spine suggestive of diffuse
idiopathic skeletal hyperostosis (DISH)
3. Thoracic and lumbar spine degenerative changes noted.

## 2021-07-17 IMAGING — US US BIOPSY
1 series · 10 of 10 positions shown · non-contrast
Comparison: none

INDICATION: Renal insufficiency, proteinuria, hypertension, diabetes and
monoclonal gammopathy. The patient requires renal biopsy.

[Series 1: us biopsy (kidney) · 10 of 10 slices shown]
[im 1/10]
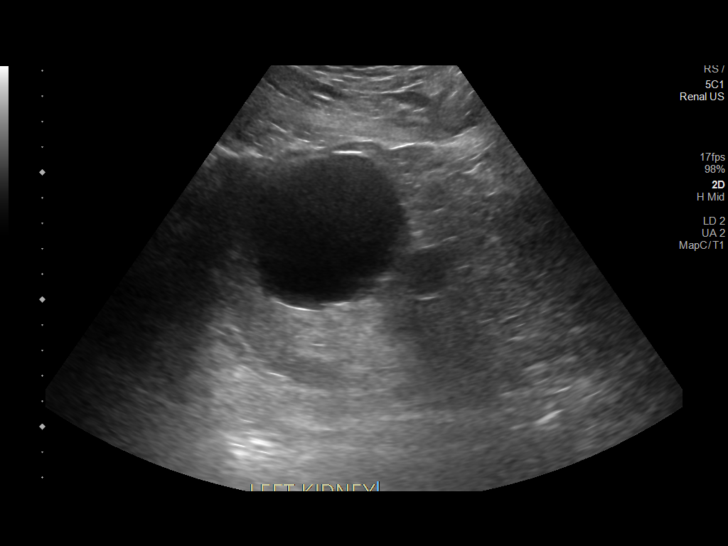
[im 2/10]
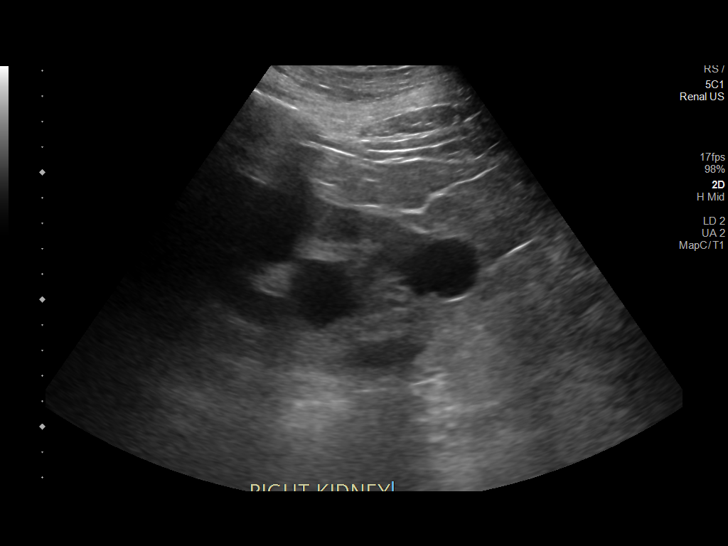
[im 3/10]
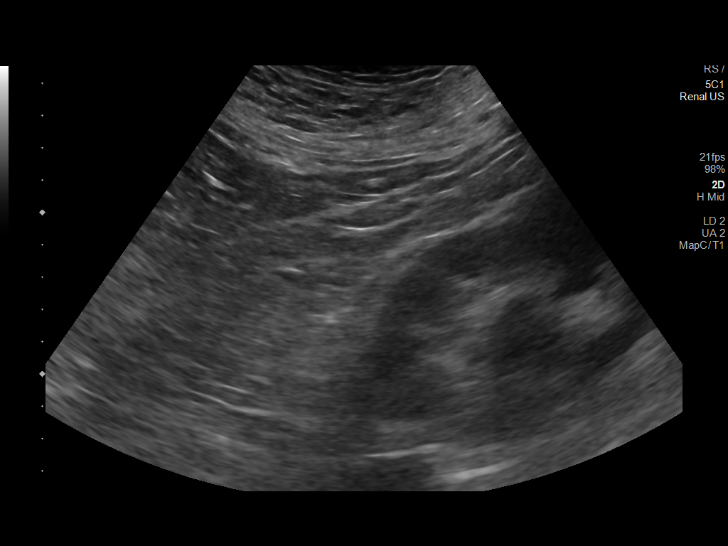
[im 4/10]
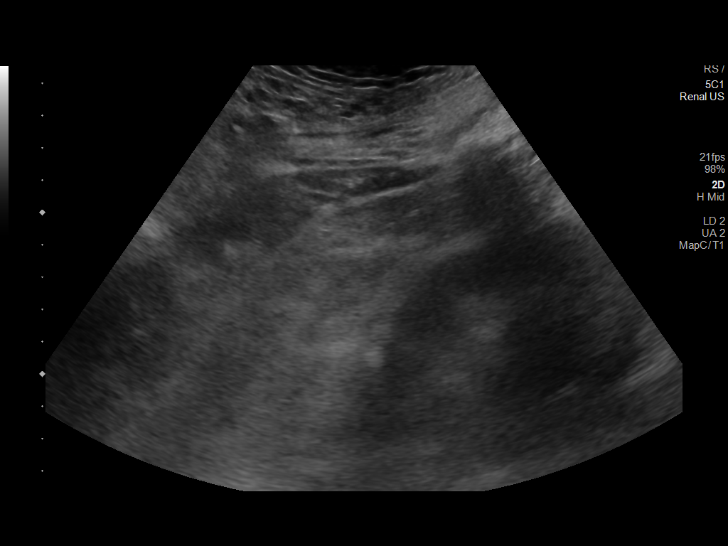
[im 5/10]
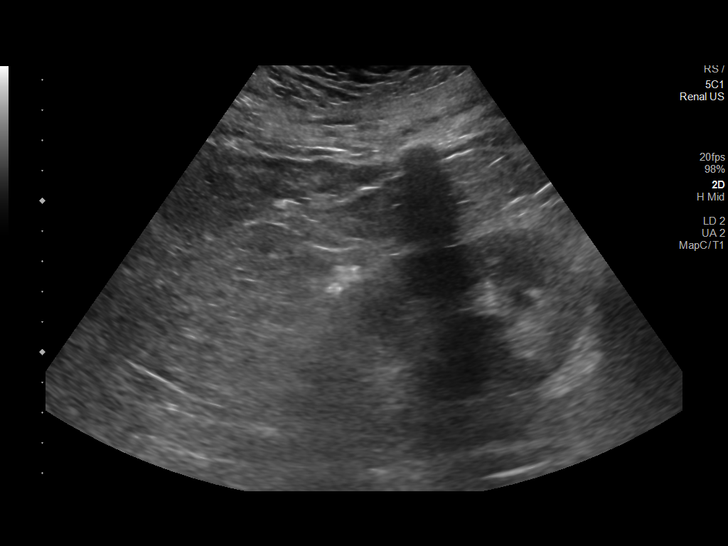
[im 6/10]
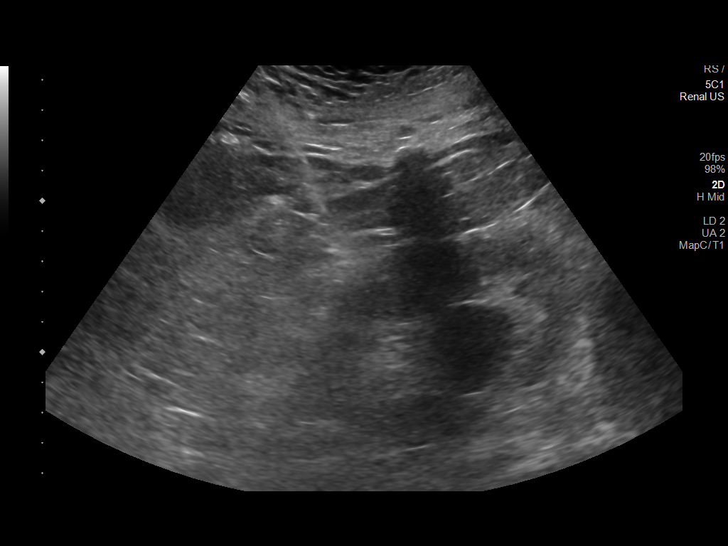
[im 7/10]
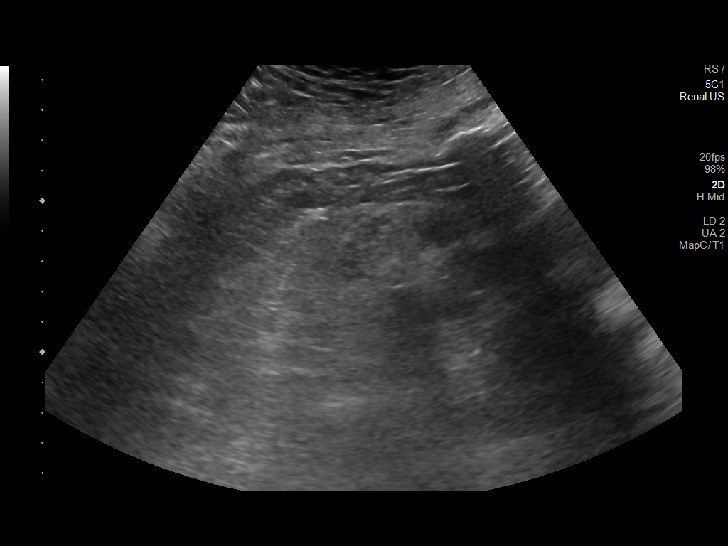
[im 8/10]
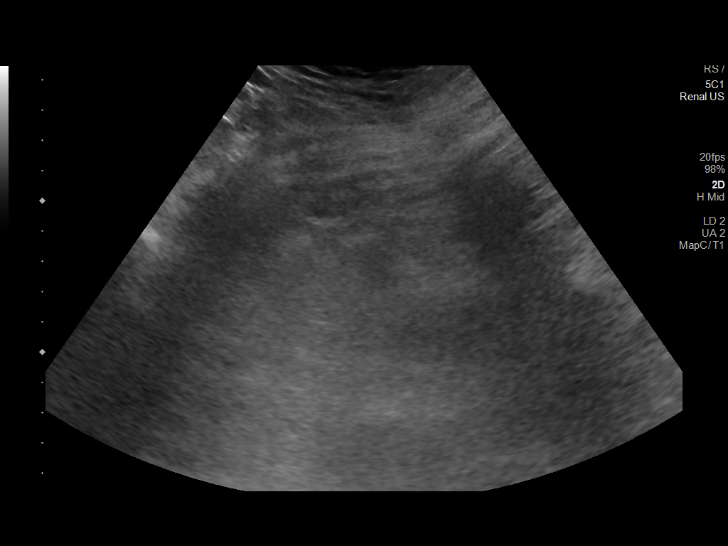
[im 9/10]
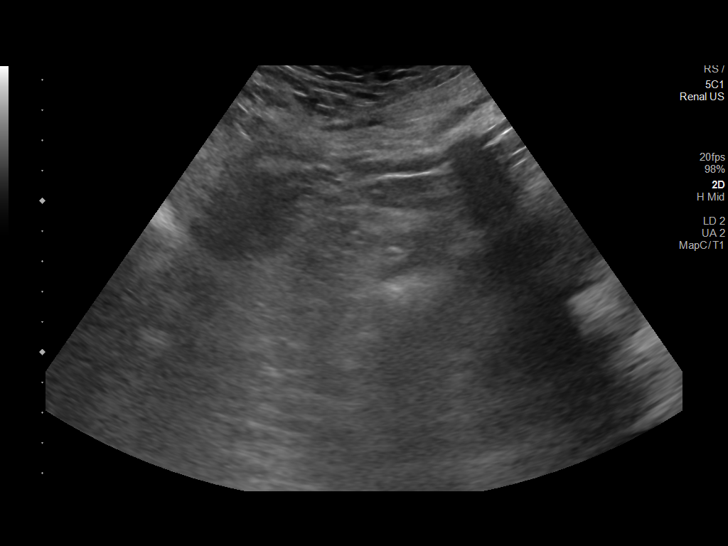
[im 10/10]
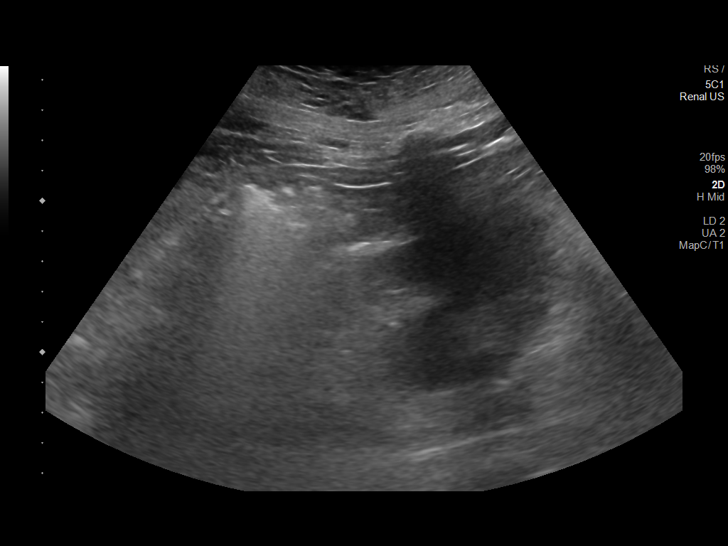

[10 of 10 positions shown; findings below may reference images not displayed]

EXAM:
ULTRASOUND GUIDED CORE BIOPSY OF RIGHT KIDNEY

MEDICATIONS:
None.

ANESTHESIA/SEDATION:
Fentanyl 50 mcg IV; Versed 1.0 mg IV

Moderate Sedation Time:  15 minutes.

The patient was continuously monitored during the procedure by the
interventional radiology nurse under my direct supervision.

PROCEDURE:
The procedure, risks, benefits, and alternatives were explained to
the patient. Questions regarding the procedure were encouraged and
answered. The patient understands and consents to the procedure. A
time-out was performed prior to initiating the procedure.

Both kidneys were localized by ultrasound. The right flank region
was prepped with chlorhexidine in a sterile fashion, and a sterile
drape was applied covering the operative field. A sterile gown and
sterile gloves were used for the procedure. Local anesthesia was
provided with 1% Lidocaine.

Under ultrasound guidance, a 15 gauge trocar needle was advanced to
the margin of lower pole cortex of the right kidney. After
confirming needle tip position, 2 separate coaxial 16 gauge core
biopsy samples were obtained with an automated device. Samples were
submitted in saline. A slurry of Gel-Foam pledgets in saline were
then injected through the outer needle as the needle was retracted
and removed. Additional ultrasound was performed.

COMPLICATIONS:
None immediate.
FINDINGS: Both kidneys were visualized by ultrasound. There are some renal
cysts bilaterally especially at the level of the left kidney which
limited visualization of the renal cortex. Solid core biopsy samples
were obtained from the lower pole of the right kidney.
IMPRESSION: Ultrasound-guided core biopsy performed of lower pole cortex of the
right kidney.

## 2021-07-25 DIAGNOSIS — L89892 Pressure ulcer of other site, stage 2: Secondary | ICD-10-CM | POA: Diagnosis not present

## 2021-07-25 DIAGNOSIS — E1140 Type 2 diabetes mellitus with diabetic neuropathy, unspecified: Secondary | ICD-10-CM | POA: Diagnosis not present

## 2021-07-25 DIAGNOSIS — E1151 Type 2 diabetes mellitus with diabetic peripheral angiopathy without gangrene: Secondary | ICD-10-CM | POA: Diagnosis not present

## 2021-07-25 DIAGNOSIS — M79671 Pain in right foot: Secondary | ICD-10-CM | POA: Diagnosis not present

## 2021-07-29 DIAGNOSIS — Z20828 Contact with and (suspected) exposure to other viral communicable diseases: Secondary | ICD-10-CM | POA: Diagnosis not present

## 2021-08-14 ENCOUNTER — Other Ambulatory Visit (HOSPITAL_COMMUNITY): Payer: Self-pay

## 2021-08-14 DIAGNOSIS — D472 Monoclonal gammopathy: Secondary | ICD-10-CM

## 2021-08-14 NOTE — Progress Notes (Signed)
Lab orders placed for [facility] per patient request.

## 2021-08-15 DIAGNOSIS — M79672 Pain in left foot: Secondary | ICD-10-CM | POA: Diagnosis not present

## 2021-08-15 DIAGNOSIS — M79671 Pain in right foot: Secondary | ICD-10-CM | POA: Diagnosis not present

## 2021-08-15 DIAGNOSIS — M79674 Pain in right toe(s): Secondary | ICD-10-CM | POA: Diagnosis not present

## 2021-08-15 DIAGNOSIS — L110 Acquired keratosis follicularis: Secondary | ICD-10-CM | POA: Diagnosis not present

## 2021-08-15 DIAGNOSIS — E1140 Type 2 diabetes mellitus with diabetic neuropathy, unspecified: Secondary | ICD-10-CM | POA: Diagnosis not present

## 2021-08-15 DIAGNOSIS — M79675 Pain in left toe(s): Secondary | ICD-10-CM | POA: Diagnosis not present

## 2021-08-15 DIAGNOSIS — I739 Peripheral vascular disease, unspecified: Secondary | ICD-10-CM | POA: Diagnosis not present

## 2021-08-20 ENCOUNTER — Encounter (HOSPITAL_COMMUNITY): Payer: Self-pay | Admitting: *Deleted

## 2021-08-20 ENCOUNTER — Inpatient Hospital Stay (HOSPITAL_COMMUNITY): Payer: Medicare Other

## 2021-08-20 ENCOUNTER — Other Ambulatory Visit (HOSPITAL_COMMUNITY): Payer: Self-pay

## 2021-08-20 DIAGNOSIS — N041 Nephrotic syndrome with focal and segmental glomerular lesions: Secondary | ICD-10-CM | POA: Diagnosis not present

## 2021-08-20 DIAGNOSIS — R809 Proteinuria, unspecified: Secondary | ICD-10-CM | POA: Diagnosis not present

## 2021-08-20 DIAGNOSIS — I129 Hypertensive chronic kidney disease with stage 1 through stage 4 chronic kidney disease, or unspecified chronic kidney disease: Secondary | ICD-10-CM | POA: Diagnosis not present

## 2021-08-20 DIAGNOSIS — E211 Secondary hyperparathyroidism, not elsewhere classified: Secondary | ICD-10-CM | POA: Diagnosis not present

## 2021-08-20 DIAGNOSIS — D472 Monoclonal gammopathy: Secondary | ICD-10-CM

## 2021-08-20 NOTE — Telephone Encounter (Signed)
Patient called and stated that he was not able to have labs done today, as [facility] lab advised that they did not have lab slips, despite the fact that they had been faxed to them twice.  Patient has transportation issues and will not be able to make 11/8 appointment with Dr Delton Coombes.  Requested a call back on Thursday to reschedule lab appointment and provider appointment.

## 2021-08-27 ENCOUNTER — Ambulatory Visit (HOSPITAL_COMMUNITY): Payer: Medicare Other | Admitting: Hematology

## 2021-08-28 DIAGNOSIS — Z20828 Contact with and (suspected) exposure to other viral communicable diseases: Secondary | ICD-10-CM | POA: Diagnosis not present

## 2021-09-04 DIAGNOSIS — N041 Nephrotic syndrome with focal and segmental glomerular lesions: Secondary | ICD-10-CM | POA: Diagnosis not present

## 2021-09-04 DIAGNOSIS — D631 Anemia in chronic kidney disease: Secondary | ICD-10-CM | POA: Diagnosis not present

## 2021-09-04 DIAGNOSIS — I129 Hypertensive chronic kidney disease with stage 1 through stage 4 chronic kidney disease, or unspecified chronic kidney disease: Secondary | ICD-10-CM | POA: Diagnosis not present

## 2021-09-04 DIAGNOSIS — E211 Secondary hyperparathyroidism, not elsewhere classified: Secondary | ICD-10-CM | POA: Diagnosis not present

## 2021-09-04 DIAGNOSIS — N189 Chronic kidney disease, unspecified: Secondary | ICD-10-CM | POA: Diagnosis not present

## 2021-09-04 DIAGNOSIS — R809 Proteinuria, unspecified: Secondary | ICD-10-CM | POA: Diagnosis not present

## 2021-09-09 ENCOUNTER — Encounter (HOSPITAL_COMMUNITY): Payer: Self-pay | Admitting: *Deleted

## 2021-09-27 DIAGNOSIS — Z20828 Contact with and (suspected) exposure to other viral communicable diseases: Secondary | ICD-10-CM | POA: Diagnosis not present

## 2021-10-10 DIAGNOSIS — E1151 Type 2 diabetes mellitus with diabetic peripheral angiopathy without gangrene: Secondary | ICD-10-CM | POA: Diagnosis not present

## 2021-10-10 DIAGNOSIS — L89892 Pressure ulcer of other site, stage 2: Secondary | ICD-10-CM | POA: Diagnosis not present

## 2021-10-10 DIAGNOSIS — E1140 Type 2 diabetes mellitus with diabetic neuropathy, unspecified: Secondary | ICD-10-CM | POA: Diagnosis not present

## 2021-10-10 DIAGNOSIS — M79671 Pain in right foot: Secondary | ICD-10-CM | POA: Diagnosis not present

## 2021-10-30 ENCOUNTER — Inpatient Hospital Stay (HOSPITAL_COMMUNITY): Payer: Medicare PPO | Attending: Hematology

## 2021-10-30 ENCOUNTER — Other Ambulatory Visit: Payer: Self-pay

## 2021-10-30 ENCOUNTER — Other Ambulatory Visit (HOSPITAL_COMMUNITY): Payer: Self-pay

## 2021-10-30 DIAGNOSIS — R5383 Other fatigue: Secondary | ICD-10-CM | POA: Insufficient documentation

## 2021-10-30 DIAGNOSIS — Z800 Family history of malignant neoplasm of digestive organs: Secondary | ICD-10-CM | POA: Insufficient documentation

## 2021-10-30 DIAGNOSIS — Z808 Family history of malignant neoplasm of other organs or systems: Secondary | ICD-10-CM | POA: Insufficient documentation

## 2021-10-30 DIAGNOSIS — N1832 Chronic kidney disease, stage 3b: Secondary | ICD-10-CM | POA: Insufficient documentation

## 2021-10-30 DIAGNOSIS — R0602 Shortness of breath: Secondary | ICD-10-CM | POA: Insufficient documentation

## 2021-10-30 DIAGNOSIS — E119 Type 2 diabetes mellitus without complications: Secondary | ICD-10-CM | POA: Insufficient documentation

## 2021-10-30 DIAGNOSIS — E785 Hyperlipidemia, unspecified: Secondary | ICD-10-CM | POA: Diagnosis not present

## 2021-10-30 DIAGNOSIS — F329 Major depressive disorder, single episode, unspecified: Secondary | ICD-10-CM | POA: Diagnosis not present

## 2021-10-30 DIAGNOSIS — D472 Monoclonal gammopathy: Secondary | ICD-10-CM

## 2021-10-30 DIAGNOSIS — G629 Polyneuropathy, unspecified: Secondary | ICD-10-CM | POA: Insufficient documentation

## 2021-10-30 DIAGNOSIS — I10 Essential (primary) hypertension: Secondary | ICD-10-CM | POA: Insufficient documentation

## 2021-10-30 DIAGNOSIS — C61 Malignant neoplasm of prostate: Secondary | ICD-10-CM | POA: Diagnosis not present

## 2021-10-30 DIAGNOSIS — I129 Hypertensive chronic kidney disease with stage 1 through stage 4 chronic kidney disease, or unspecified chronic kidney disease: Secondary | ICD-10-CM | POA: Diagnosis not present

## 2021-10-30 DIAGNOSIS — Z79899 Other long term (current) drug therapy: Secondary | ICD-10-CM | POA: Insufficient documentation

## 2021-10-30 DIAGNOSIS — E1122 Type 2 diabetes mellitus with diabetic chronic kidney disease: Secondary | ICD-10-CM | POA: Insufficient documentation

## 2021-10-30 DIAGNOSIS — R7989 Other specified abnormal findings of blood chemistry: Secondary | ICD-10-CM | POA: Insufficient documentation

## 2021-10-30 LAB — COMPREHENSIVE METABOLIC PANEL
ALT: 15 U/L (ref 0–44)
AST: 16 U/L (ref 15–41)
Albumin: 4 g/dL (ref 3.5–5.0)
Alkaline Phosphatase: 62 U/L (ref 38–126)
Anion gap: 11 (ref 5–15)
BUN: 51 mg/dL — ABNORMAL HIGH (ref 8–23)
CO2: 28 mmol/L (ref 22–32)
Calcium: 9.5 mg/dL (ref 8.9–10.3)
Chloride: 96 mmol/L — ABNORMAL LOW (ref 98–111)
Creatinine, Ser: 2.04 mg/dL — ABNORMAL HIGH (ref 0.61–1.24)
GFR, Estimated: 33 mL/min — ABNORMAL LOW (ref >60)
Glucose, Bld: 236 mg/dL — ABNORMAL HIGH (ref 70–99)
Potassium: 3.7 mmol/L (ref 3.5–5.1)
Sodium: 135 mmol/L (ref 135–145)
Total Bilirubin: 0.8 mg/dL (ref 0.3–1.2)
Total Protein: 7.2 g/dL (ref 6.5–8.1)

## 2021-10-30 LAB — CBC WITH DIFFERENTIAL/PLATELET
Abs Immature Granulocytes: 0.04 10*3/uL (ref 0.00–0.07)
Basophils Absolute: 0.1 10*3/uL (ref 0.0–0.1)
Basophils Relative: 1 %
Eosinophils Absolute: 0.4 10*3/uL (ref 0.0–0.5)
Eosinophils Relative: 3 %
HCT: 46.5 % (ref 39.0–52.0)
Hemoglobin: 15.7 g/dL (ref 13.0–17.0)
Immature Granulocytes: 0 %
Lymphocytes Relative: 21 %
Lymphs Abs: 2.6 10*3/uL (ref 0.7–4.0)
MCH: 30.6 pg (ref 26.0–34.0)
MCHC: 33.8 g/dL (ref 30.0–36.0)
MCV: 90.6 fL (ref 80.0–100.0)
Monocytes Absolute: 1 10*3/uL (ref 0.1–1.0)
Monocytes Relative: 9 %
Neutro Abs: 8.1 10*3/uL — ABNORMAL HIGH (ref 1.7–7.7)
Neutrophils Relative %: 66 %
Platelets: 232 10*3/uL (ref 150–400)
RBC: 5.13 MIL/uL (ref 4.22–5.81)
RDW: 14.1 % (ref 11.5–15.5)
WBC: 12.2 10*3/uL — ABNORMAL HIGH (ref 4.0–10.5)
nRBC: 0 % (ref 0.0–0.2)

## 2021-10-30 LAB — LACTATE DEHYDROGENASE: LDH: 100 U/L (ref 98–192)

## 2021-10-30 NOTE — Progress Notes (Signed)
Lab orders placed per Dr. Delton Coombes. Previous orders released in error as the patient will no longer be going to [facility] for lab work.

## 2021-10-31 LAB — KAPPA/LAMBDA LIGHT CHAINS
Kappa free light chain: 92.3 mg/L — ABNORMAL HIGH (ref 3.3–19.4)
Kappa, lambda light chain ratio: 2.62 — ABNORMAL HIGH (ref 0.26–1.65)
Lambda free light chains: 35.2 mg/L — ABNORMAL HIGH (ref 5.7–26.3)

## 2021-11-01 LAB — PROTEIN ELECTROPHORESIS, SERUM
A/G Ratio: 1 (ref 0.7–1.7)
Albumin ELP: 3.3 g/dL (ref 2.9–4.4)
Alpha-1-Globulin: 0.3 g/dL (ref 0.0–0.4)
Alpha-2-Globulin: 0.8 g/dL (ref 0.4–1.0)
Beta Globulin: 1 g/dL (ref 0.7–1.3)
Gamma Globulin: 1.2 g/dL (ref 0.4–1.8)
Globulin, Total: 3.3 g/dL (ref 2.2–3.9)
Total Protein ELP: 6.6 g/dL (ref 6.0–8.5)

## 2021-11-05 LAB — IMMUNOFIXATION ELECTROPHORESIS
IgA: 269 mg/dL (ref 61–437)
IgG (Immunoglobin G), Serum: 997 mg/dL (ref 603–1613)
IgM (Immunoglobulin M), Srm: 75 mg/dL (ref 15–143)
Total Protein ELP: 6.9 g/dL (ref 6.0–8.5)

## 2021-11-06 ENCOUNTER — Other Ambulatory Visit: Payer: Self-pay

## 2021-11-06 ENCOUNTER — Inpatient Hospital Stay (HOSPITAL_COMMUNITY): Payer: Medicare PPO | Admitting: Hematology

## 2021-11-06 ENCOUNTER — Ambulatory Visit (HOSPITAL_COMMUNITY): Payer: Medicare Other | Admitting: Hematology

## 2021-11-06 VITALS — BP 100/59 | HR 60 | Temp 96.7°F | Resp 19 | Wt 230.0 lb

## 2021-11-06 DIAGNOSIS — R7989 Other specified abnormal findings of blood chemistry: Secondary | ICD-10-CM | POA: Diagnosis not present

## 2021-11-06 DIAGNOSIS — D472 Monoclonal gammopathy: Secondary | ICD-10-CM

## 2021-11-06 DIAGNOSIS — G629 Polyneuropathy, unspecified: Secondary | ICD-10-CM | POA: Diagnosis not present

## 2021-11-06 DIAGNOSIS — C61 Malignant neoplasm of prostate: Secondary | ICD-10-CM | POA: Diagnosis not present

## 2021-11-06 DIAGNOSIS — F329 Major depressive disorder, single episode, unspecified: Secondary | ICD-10-CM | POA: Diagnosis not present

## 2021-11-06 DIAGNOSIS — R5383 Other fatigue: Secondary | ICD-10-CM | POA: Diagnosis not present

## 2021-11-06 DIAGNOSIS — R0602 Shortness of breath: Secondary | ICD-10-CM | POA: Diagnosis not present

## 2021-11-06 DIAGNOSIS — E785 Hyperlipidemia, unspecified: Secondary | ICD-10-CM | POA: Diagnosis not present

## 2021-11-06 DIAGNOSIS — I10 Essential (primary) hypertension: Secondary | ICD-10-CM | POA: Diagnosis not present

## 2021-11-06 NOTE — Patient Instructions (Addendum)
[place] at [facility] Discharge Instructions   You were seen and examined today by Dr. Delton Coombes.  He reviewed the results of your lab work.  We will see you back in 6 months to repeat your lab work and review the results with you.    Thank you for choosing [place] at [facility] to provide your oncology and hematology care.  To afford each patient quality time with our provider, please arrive at least 15 minutes before your scheduled appointment time.   If you have a lab appointment with the [facility] please come in thru the Main Entrance and check in at the main information desk.  You need to re-schedule your appointment should you arrive 10 or more minutes late.  We strive to give you quality time with our providers, and arriving late affects you and other patients whose appointments are after yours.  Also, if you no show three or more times for appointments you may be dismissed from the clinic at the providers discretion.     Again, thank you for choosing [facility].  Our hope is that these requests will decrease the amount of time that you wait before being seen by our physicians.       _____________________________________________________________  Should you have questions after your visit to [facility], please contact our office at [phone] and follow the prompts.  Our office hours are 8:00 a.m. and 4:30 p.m. Monday - Friday.  Please note that voicemails left after 4:00 p.m. may not be returned until the following business day.  We are closed weekends and major holidays.  You do have access to a nurse 24-7, just call the main number to the clinic [phone] and do not press any options, hold on the line and a nurse will answer the phone.    For prescription refill requests, have your pharmacy contact our office and allow 72 hours.    Due to Covid, you will need to wear a mask upon entering the  hospital. If you do not have a mask, a mask will be given to you at the Main Entrance upon arrival. For doctor visits, patients may have 1 support person age 57 or older with them. For treatment visits, patients can not have anyone with them due to social distancing guidelines and our immunocompromised population.

## 2021-11-06 NOTE — Progress Notes (Signed)
[place], [place] [address]   CLINIC:  Medical Oncology/Hematology  PCP:  [name] [name], [place]  [id number]  [phone]  REASON FOR VISIT:  Follow-up for MGUS  PRIOR THERAPY: none  CURRENT THERAPY: surveillance  INTERVAL HISTORY:  [name], a 76 y.o. male, returns for routine follow-up for his MGUS. [name] [name] was last seen on 02/12/2021.  Today he reports feeling well. He denies new pains. He reports stable numbness in his fingers.   REVIEW OF SYSTEMS:  Review of Systems  Constitutional:  Positive for fatigue. Negative for appetite change.  Respiratory:  Positive for shortness of breath.   Genitourinary:  Positive for difficulty urinating.   Neurological:  Positive for numbness (fingers).  Psychiatric/Behavioral:  Positive for depression and sleep disturbance. The patient is nervous/anxious.   All other systems reviewed and are negative.  PAST MEDICAL/SURGICAL HISTORY:  Past Medical History:  Diagnosis Date   Anemia    Anxiety    Cancer ([place])    Prostate- Stage I   Critical lower limb ischemia (HCC)    Essential hypertension    Gangrene of foot [facility]) March 2016   Left foot   Hyperlipidemia    Neuropathy    Type 2 diabetes mellitus ([place])    Past Surgical History:  Procedure Laterality Date   AMPUTATION Left 01/12/2015   Procedure: AMPUTATION BELOW KNEE;  Surgeon: [name] Koyanagi, MD;  Location: [place];  Service: Orthopedics;  Laterality: Left;   APPLICATION OF WOUND VAC Right 01/12/2015   Procedure: APPLICATION OF WOUND VAC;  Surgeon: [name] Koyanagi, MD;  Location: [place];  Service: Orthopedics;  Laterality: Right;   CARDIAC ELECTROPHYSIOLOGY STUDY AND ABLATION     CHOLECYSTECTOMY     ORIF HUMERUS FRACTURE      SOCIAL HISTORY:  Social History   Socioeconomic History   Marital status: Single    Spouse name: Not on file   Number of children: 0   Years of education: Not on file   Highest education  level: Not on file  Occupational History   Occupation: RETIRED  Tobacco Use   Smoking status: Never   Smokeless tobacco: Never  Vaping Use   Vaping Use: Never used  Substance and Sexual Activity   Alcohol use: Yes    Alcohol/week: 0.0 standard drinks    Comment: rare   Drug use: No   Sexual activity: Not Currently  Other Topics Concern   Not on file  Social History Narrative   Not on file   Social Determinants of Health   Financial Resource Strain: Not on file  Food Insecurity: Not on file  Transportation Needs: Not on file  Physical Activity: Not on file  Stress: Not on file  Social Connections: Not on file  Intimate Partner Violence: Not on file    FAMILY HISTORY:  Family History  Problem Relation Age of Onset   Hypertension Father        and kidney failure   Heart disease Father    Heart attack Father    Kidney disease Father    Alzheimer's disease Mother    Heart attack Maternal Grandfather    Heart attack Paternal Grandfather    Colon cancer Paternal Aunt 75       s/p surgical rsxn; lived to 76 yrs old   Hypertension Sister    Hypercholesterolemia Sister    Cervical cancer Sister    Gastric cancer Neg Hx  Esophageal cancer Neg Hx     CURRENT MEDICATIONS:  Current Outpatient Medications  Medication Sig Dispense Refill   calcitRIOL (ROCALTROL) 0.25 MCG capsule Take by mouth.     ALPRAZolam (XANAX) 1 MG tablet Take 1.5 mg by mouth at bedtime.     ascorbic acid (VITAMIN C) 1000 MG tablet Take 1,000 mg by mouth daily in the afternoon.      cadexomer iodine (IODOSORB) 0.9 % gel Apply 1 application topically daily as needed for wound care.  (Patient not taking: Reported on 11/06/2021)     Calcium Carbonate (CALCIUM 600 PO) Take 600 mg by mouth daily in the afternoon.     finasteride (PROSCAR) 5 MG tablet Take 5 mg by mouth daily in the afternoon.      furosemide (LASIX) 40 MG tablet Take 40 mg by mouth 2 (two) times daily.     gabapentin (NEURONTIN) 300 MG  capsule Take 600 mg by mouth 2 (two) times daily as needed (pain.).  (Patient not taking: Reported on 11/06/2021)     glimepiride (AMARYL) 4 MG tablet Take 4 mg by mouth daily in the afternoon.      hydrALAZINE (APRESOLINE) 50 MG tablet Take 50 mg by mouth 2 (two) times daily.     HYDROcodone-acetaminophen (NORCO/VICODIN) 5-325 MG tablet Take 1 tablet by mouth 2 (two) times daily as needed. (Patient not taking: Reported on 11/06/2021)     losartan (COZAAR) 50 MG tablet Take 50 mg by mouth daily.     metoprolol (LOPRESSOR) 100 MG tablet Take 100 mg by mouth 2 (two) times daily.     Multiple Vitamins-Minerals (MULTIVITAMIN WITH MINERALS) tablet Take 1 tablet by mouth daily in the afternoon.      Omega-3 Fatty Acids (FISH OIL) 1200 MG CAPS Take 1,200 mg by mouth daily in the afternoon.     potassium chloride (KLOR-CON) 10 MEQ tablet Take 10 mEq by mouth daily.     sildenafil (REVATIO) 20 MG tablet Take by mouth.     simvastatin (ZOCOR) 40 MG tablet Take 40 mg by mouth daily in the afternoon.      tamsulosin (FLOMAX) 0.4 MG CAPS capsule Take 0.4 mg by mouth daily in the afternoon.      vitamin E 400 UNIT capsule Take 400 Units by mouth daily.     No current facility-administered medications for this visit.    ALLERGIES:  No Known Allergies  PHYSICAL EXAM:  Performance status (ECOG): 1 - Symptomatic but completely ambulatory  Vitals:   11/06/21 1149  BP: (!) 100/59  Pulse: 60  Resp: 19  Temp: (!) 96.7 F (35.9 C)  SpO2: 97%   Wt Readings from Last 3 Encounters:  11/06/21 230 lb (104.3 kg)  02/12/21 235 lb 9.6 oz (106.9 kg)  01/30/21 234 lb 4.8 oz (106.3 kg)   Physical Exam Vitals reviewed.  Constitutional:      Appearance: Normal appearance. He is obese.  Cardiovascular:     Rate and Rhythm: Normal rate and regular rhythm.     Pulses: Normal pulses.     Heart sounds: Normal heart sounds.  Pulmonary:     Effort: Pulmonary effort is normal.     Breath sounds: Normal breath  sounds.  Neurological:     General: No focal deficit present.     Mental Status: He is alert and oriented to person, place, and time.  Psychiatric:        Mood and Affect: Mood normal.  Behavior: Behavior normal.    LABORATORY DATA:  I have reviewed the labs as listed.  CBC Latest Ref Rng & Units 10/30/2021 02/05/2021 07/31/2020  WBC 4.0 - 10.5 K/uL 12.2(H) 10.5 10.1  Hemoglobin 13.0 - 17.0 g/dL 15.7 15.2 14.2  Hematocrit 39.0 - 52.0 % 46.5 47.0 42.8  Platelets 150 - 400 K/uL 232 210 216   CMP Latest Ref Rng & Units 10/30/2021 02/05/2021 07/31/2020  Glucose 70 - 99 mg/dL 236(H) 122(H) 93  BUN 8 - 23 mg/dL 51(H) 27(H) 23  Creatinine 0.61 - 1.24 mg/dL 2.04(H) 1.60(H) 1.82(H)  Sodium 135 - 145 mmol/L 135 139 135  Potassium 3.5 - 5.1 mmol/L 3.7 3.7 3.8  Chloride 98 - 111 mmol/L 96(L) 98 99  CO2 22 - 32 mmol/L 28 28 27   Calcium 8.9 - 10.3 mg/dL 9.5 8.8(L) 9.1  Total Protein 6.5 - 8.1 g/dL 7.2 6.8 6.5  Total Bilirubin 0.3 - 1.2 mg/dL 0.8 0.6 0.8  Alkaline Phos 38 - 126 U/L 62 56 55  AST 15 - 41 U/L 16 17 15   ALT 0 - 44 U/L 15 18 13       Component Value Date/Time   RBC 5.13 10/30/2021 1343   MCV 90.6 10/30/2021 1343   MCH 30.6 10/30/2021 1343   MCHC 33.8 10/30/2021 1343   RDW 14.1 10/30/2021 1343   LYMPHSABS 2.6 10/30/2021 1343   MONOABS 1.0 10/30/2021 1343   EOSABS 0.4 10/30/2021 1343   BASOSABS 0.1 10/30/2021 1343    DIAGNOSTIC IMAGING:  I have independently reviewed the scans and discussed with the patient. No results found.   ASSESSMENT:  1.  Monoclonal gammopathy: -Patient seen at the request of Dr. Theador Hawthorne for further work-up of monoclonal gammopathy.  UPEP on 11/25/2019 showed poorly defined area of restricted protein mobility reactive to IgG and kappa antisera. -Labs from 03/13/2020 shows M spike is not seen.  Free light chain ratio is elevated at 3.31.  Kappa light chain 67.6, lambda light chains 20.4.  Immunofixation was unremarkable. -Skeletal survey on  03/13/2020 with no lucent lesions of myeloma.   2.  CKD: -CKD stage IIIb since 2016 secondary to diabetes and hypertension.  He has nephrotic range proteinuria. -Ultrasound of the kidneys on 12/06/2019 shows bilateral renal cyst with no obstructing lesions.  Renal cortical thickness and renal echogenicity normal bilaterally. -Kidney biopsy on 03/14/2020 consistent with arteriosclerosis with arterionephrosclerosis and FSGS.   PLAN:  1.  Elevated free light chains: - We have reviewed myeloma labs.  SPEP and immunofixation was normal.  Creatinine is 2.04 and calcium 9.5.  CBC was normal. - Kappa light chains increased to 92 from 65.  Lambda light chains have also slightly improved to 35 and ratio increased to 2.61. - Likely reason for elevated light chains and ratio is increased creatinine.  He is also taking Lasix 40 mg 4 times daily.  Previously was taking twice daily. - We will see him back in 6 months for follow-up with repeat labs.   2.  CKD: - Creatinine is 2.04.  Continue follow-up with Dr. Theador Hawthorne.   3.  Prostate cancer: - He follows up with Dr. Blair Promise at [facility] and is under watchful waiting.  Orders placed this encounter:  No orders of the defined types were placed in this encounter.    Derek Jack, MD [place] [phone]   I, Thana Ates, am acting as a scribe for Dr. Derek Jack.  Kinnie Scales MD, have reviewed the above  documentation for accuracy and completeness, and I agree with the above.

## 2021-11-13 DIAGNOSIS — R809 Proteinuria, unspecified: Secondary | ICD-10-CM | POA: Diagnosis not present

## 2021-11-13 DIAGNOSIS — I129 Hypertensive chronic kidney disease with stage 1 through stage 4 chronic kidney disease, or unspecified chronic kidney disease: Secondary | ICD-10-CM | POA: Diagnosis not present

## 2021-11-13 DIAGNOSIS — E211 Secondary hyperparathyroidism, not elsewhere classified: Secondary | ICD-10-CM | POA: Diagnosis not present

## 2021-11-13 DIAGNOSIS — L03115 Cellulitis of right lower limb: Secondary | ICD-10-CM | POA: Diagnosis not present

## 2021-11-28 DIAGNOSIS — I10 Essential (primary) hypertension: Secondary | ICD-10-CM | POA: Diagnosis not present

## 2021-11-28 DIAGNOSIS — Z299 Encounter for prophylactic measures, unspecified: Secondary | ICD-10-CM | POA: Diagnosis not present

## 2021-11-28 DIAGNOSIS — L97509 Non-pressure chronic ulcer of other part of unspecified foot with unspecified severity: Secondary | ICD-10-CM | POA: Diagnosis not present

## 2021-11-28 DIAGNOSIS — E1165 Type 2 diabetes mellitus with hyperglycemia: Secondary | ICD-10-CM | POA: Diagnosis not present

## 2021-11-28 DIAGNOSIS — S88112S Complete traumatic amputation at level between knee and ankle, left lower leg, sequela: Secondary | ICD-10-CM | POA: Diagnosis not present

## 2021-11-28 DIAGNOSIS — Z6835 Body mass index (BMI) 35.0-35.9, adult: Secondary | ICD-10-CM | POA: Diagnosis not present

## 2021-11-28 DIAGNOSIS — E11621 Type 2 diabetes mellitus with foot ulcer: Secondary | ICD-10-CM | POA: Diagnosis not present

## 2021-12-31 DIAGNOSIS — N401 Enlarged prostate with lower urinary tract symptoms: Secondary | ICD-10-CM | POA: Diagnosis not present

## 2021-12-31 DIAGNOSIS — N281 Cyst of kidney, acquired: Secondary | ICD-10-CM | POA: Diagnosis not present

## 2021-12-31 DIAGNOSIS — R338 Other retention of urine: Secondary | ICD-10-CM | POA: Diagnosis not present

## 2021-12-31 DIAGNOSIS — C61 Malignant neoplasm of prostate: Secondary | ICD-10-CM | POA: Diagnosis not present

## 2022-01-01 DIAGNOSIS — E1122 Type 2 diabetes mellitus with diabetic chronic kidney disease: Secondary | ICD-10-CM | POA: Diagnosis not present

## 2022-01-01 DIAGNOSIS — Z299 Encounter for prophylactic measures, unspecified: Secondary | ICD-10-CM | POA: Diagnosis not present

## 2022-01-01 DIAGNOSIS — N189 Chronic kidney disease, unspecified: Secondary | ICD-10-CM | POA: Diagnosis not present

## 2022-01-01 DIAGNOSIS — L02415 Cutaneous abscess of right lower limb: Secondary | ICD-10-CM | POA: Diagnosis not present

## 2022-01-01 DIAGNOSIS — L03115 Cellulitis of right lower limb: Secondary | ICD-10-CM | POA: Diagnosis not present

## 2022-01-01 DIAGNOSIS — I4892 Unspecified atrial flutter: Secondary | ICD-10-CM | POA: Diagnosis not present

## 2022-01-01 DIAGNOSIS — R339 Retention of urine, unspecified: Secondary | ICD-10-CM | POA: Diagnosis not present

## 2022-01-01 DIAGNOSIS — I739 Peripheral vascular disease, unspecified: Secondary | ICD-10-CM | POA: Diagnosis not present

## 2022-01-01 DIAGNOSIS — E7800 Pure hypercholesterolemia, unspecified: Secondary | ICD-10-CM | POA: Diagnosis not present

## 2022-01-01 DIAGNOSIS — Z89512 Acquired absence of left leg below knee: Secondary | ICD-10-CM | POA: Diagnosis not present

## 2022-01-01 DIAGNOSIS — R32 Unspecified urinary incontinence: Secondary | ICD-10-CM | POA: Diagnosis not present

## 2022-01-01 DIAGNOSIS — I129 Hypertensive chronic kidney disease with stage 1 through stage 4 chronic kidney disease, or unspecified chronic kidney disease: Secondary | ICD-10-CM | POA: Diagnosis not present

## 2022-01-01 DIAGNOSIS — I10 Essential (primary) hypertension: Secondary | ICD-10-CM | POA: Diagnosis not present

## 2022-01-04 DIAGNOSIS — C61 Malignant neoplasm of prostate: Secondary | ICD-10-CM | POA: Diagnosis not present

## 2022-01-04 DIAGNOSIS — E7800 Pure hypercholesterolemia, unspecified: Secondary | ICD-10-CM | POA: Diagnosis not present

## 2022-01-04 DIAGNOSIS — F419 Anxiety disorder, unspecified: Secondary | ICD-10-CM | POA: Diagnosis not present

## 2022-01-04 DIAGNOSIS — Z89512 Acquired absence of left leg below knee: Secondary | ICD-10-CM | POA: Diagnosis not present

## 2022-01-04 DIAGNOSIS — T83098A Other mechanical complication of other indwelling urethral catheter, initial encounter: Secondary | ICD-10-CM | POA: Diagnosis not present

## 2022-01-04 DIAGNOSIS — I129 Hypertensive chronic kidney disease with stage 1 through stage 4 chronic kidney disease, or unspecified chronic kidney disease: Secondary | ICD-10-CM | POA: Diagnosis not present

## 2022-01-04 DIAGNOSIS — T83031A Leakage of indwelling urethral catheter, initial encounter: Secondary | ICD-10-CM | POA: Diagnosis not present

## 2022-01-04 DIAGNOSIS — N401 Enlarged prostate with lower urinary tract symptoms: Secondary | ICD-10-CM | POA: Diagnosis not present

## 2022-01-04 DIAGNOSIS — N189 Chronic kidney disease, unspecified: Secondary | ICD-10-CM | POA: Diagnosis not present

## 2022-01-04 DIAGNOSIS — E1122 Type 2 diabetes mellitus with diabetic chronic kidney disease: Secondary | ICD-10-CM | POA: Diagnosis not present

## 2022-01-07 DIAGNOSIS — R0602 Shortness of breath: Secondary | ICD-10-CM | POA: Diagnosis not present

## 2022-01-07 DIAGNOSIS — C61 Malignant neoplasm of prostate: Secondary | ICD-10-CM | POA: Diagnosis not present

## 2022-01-07 DIAGNOSIS — Z299 Encounter for prophylactic measures, unspecified: Secondary | ICD-10-CM | POA: Diagnosis not present

## 2022-01-07 DIAGNOSIS — Z6839 Body mass index (BMI) 39.0-39.9, adult: Secondary | ICD-10-CM | POA: Diagnosis not present

## 2022-01-07 DIAGNOSIS — I739 Peripheral vascular disease, unspecified: Secondary | ICD-10-CM | POA: Diagnosis not present

## 2022-01-07 DIAGNOSIS — E58 Dietary calcium deficiency: Secondary | ICD-10-CM | POA: Diagnosis not present

## 2022-01-07 DIAGNOSIS — I10 Essential (primary) hypertension: Secondary | ICD-10-CM | POA: Diagnosis not present

## 2022-01-28 DIAGNOSIS — Z6839 Body mass index (BMI) 39.0-39.9, adult: Secondary | ICD-10-CM | POA: Diagnosis not present

## 2022-01-28 DIAGNOSIS — E1142 Type 2 diabetes mellitus with diabetic polyneuropathy: Secondary | ICD-10-CM | POA: Diagnosis not present

## 2022-01-28 DIAGNOSIS — I10 Essential (primary) hypertension: Secondary | ICD-10-CM | POA: Diagnosis not present

## 2022-01-28 DIAGNOSIS — Z789 Other specified health status: Secondary | ICD-10-CM | POA: Diagnosis not present

## 2022-01-28 DIAGNOSIS — G546 Phantom limb syndrome with pain: Secondary | ICD-10-CM | POA: Diagnosis not present

## 2022-01-28 DIAGNOSIS — I4892 Unspecified atrial flutter: Secondary | ICD-10-CM | POA: Diagnosis not present

## 2022-01-28 DIAGNOSIS — Z299 Encounter for prophylactic measures, unspecified: Secondary | ICD-10-CM | POA: Diagnosis not present

## 2022-01-31 DIAGNOSIS — N401 Enlarged prostate with lower urinary tract symptoms: Secondary | ICD-10-CM | POA: Diagnosis not present

## 2022-01-31 DIAGNOSIS — R338 Other retention of urine: Secondary | ICD-10-CM | POA: Diagnosis not present

## 2022-01-31 DIAGNOSIS — C61 Malignant neoplasm of prostate: Secondary | ICD-10-CM | POA: Diagnosis not present

## 2022-02-12 DIAGNOSIS — N1832 Chronic kidney disease, stage 3b: Secondary | ICD-10-CM | POA: Diagnosis not present

## 2022-02-12 DIAGNOSIS — D631 Anemia in chronic kidney disease: Secondary | ICD-10-CM | POA: Diagnosis not present

## 2022-02-12 DIAGNOSIS — E1122 Type 2 diabetes mellitus with diabetic chronic kidney disease: Secondary | ICD-10-CM | POA: Diagnosis not present

## 2022-02-12 DIAGNOSIS — N2581 Secondary hyperparathyroidism of renal origin: Secondary | ICD-10-CM | POA: Diagnosis not present

## 2022-02-12 DIAGNOSIS — E876 Hypokalemia: Secondary | ICD-10-CM | POA: Diagnosis not present

## 2022-02-12 DIAGNOSIS — E1151 Type 2 diabetes mellitus with diabetic peripheral angiopathy without gangrene: Secondary | ICD-10-CM | POA: Diagnosis not present

## 2022-02-12 DIAGNOSIS — I129 Hypertensive chronic kidney disease with stage 1 through stage 4 chronic kidney disease, or unspecified chronic kidney disease: Secondary | ICD-10-CM | POA: Diagnosis not present

## 2022-02-12 DIAGNOSIS — E559 Vitamin D deficiency, unspecified: Secondary | ICD-10-CM | POA: Diagnosis not present

## 2022-02-12 DIAGNOSIS — D472 Monoclonal gammopathy: Secondary | ICD-10-CM | POA: Diagnosis not present

## 2022-02-14 DIAGNOSIS — R338 Other retention of urine: Secondary | ICD-10-CM | POA: Diagnosis not present

## 2022-02-14 DIAGNOSIS — N401 Enlarged prostate with lower urinary tract symptoms: Secondary | ICD-10-CM | POA: Diagnosis not present

## 2022-02-14 DIAGNOSIS — C61 Malignant neoplasm of prostate: Secondary | ICD-10-CM | POA: Diagnosis not present

## 2022-02-27 DIAGNOSIS — N184 Chronic kidney disease, stage 4 (severe): Secondary | ICD-10-CM | POA: Diagnosis not present

## 2022-02-27 DIAGNOSIS — I10 Essential (primary) hypertension: Secondary | ICD-10-CM | POA: Diagnosis not present

## 2022-02-27 DIAGNOSIS — E1122 Type 2 diabetes mellitus with diabetic chronic kidney disease: Secondary | ICD-10-CM | POA: Diagnosis not present

## 2022-02-27 DIAGNOSIS — Z299 Encounter for prophylactic measures, unspecified: Secondary | ICD-10-CM | POA: Diagnosis not present

## 2022-02-27 DIAGNOSIS — E1165 Type 2 diabetes mellitus with hyperglycemia: Secondary | ICD-10-CM | POA: Diagnosis not present

## 2022-03-14 DIAGNOSIS — N401 Enlarged prostate with lower urinary tract symptoms: Secondary | ICD-10-CM | POA: Diagnosis not present

## 2022-03-14 DIAGNOSIS — Z466 Encounter for fitting and adjustment of urinary device: Secondary | ICD-10-CM | POA: Diagnosis not present

## 2022-03-14 DIAGNOSIS — C61 Malignant neoplasm of prostate: Secondary | ICD-10-CM | POA: Diagnosis not present

## 2022-03-14 DIAGNOSIS — R338 Other retention of urine: Secondary | ICD-10-CM | POA: Diagnosis not present

## 2022-03-20 DIAGNOSIS — F419 Anxiety disorder, unspecified: Secondary | ICD-10-CM | POA: Diagnosis not present

## 2022-03-20 DIAGNOSIS — N281 Cyst of kidney, acquired: Secondary | ICD-10-CM | POA: Diagnosis not present

## 2022-03-20 DIAGNOSIS — M19071 Primary osteoarthritis, right ankle and foot: Secondary | ICD-10-CM | POA: Diagnosis not present

## 2022-03-20 DIAGNOSIS — L03119 Cellulitis of unspecified part of limb: Secondary | ICD-10-CM | POA: Diagnosis not present

## 2022-03-20 DIAGNOSIS — N400 Enlarged prostate without lower urinary tract symptoms: Secondary | ICD-10-CM | POA: Diagnosis not present

## 2022-03-20 DIAGNOSIS — N189 Chronic kidney disease, unspecified: Secondary | ICD-10-CM | POA: Diagnosis not present

## 2022-03-20 DIAGNOSIS — A419 Sepsis, unspecified organism: Secondary | ICD-10-CM | POA: Diagnosis not present

## 2022-03-20 DIAGNOSIS — I129 Hypertensive chronic kidney disease with stage 1 through stage 4 chronic kidney disease, or unspecified chronic kidney disease: Secondary | ICD-10-CM | POA: Diagnosis not present

## 2022-03-20 DIAGNOSIS — E785 Hyperlipidemia, unspecified: Secondary | ICD-10-CM | POA: Diagnosis not present

## 2022-03-20 DIAGNOSIS — Z89512 Acquired absence of left leg below knee: Secondary | ICD-10-CM | POA: Diagnosis not present

## 2022-03-20 DIAGNOSIS — R531 Weakness: Secondary | ICD-10-CM | POA: Diagnosis not present

## 2022-03-20 DIAGNOSIS — L03116 Cellulitis of left lower limb: Secondary | ICD-10-CM | POA: Diagnosis not present

## 2022-03-20 DIAGNOSIS — E11621 Type 2 diabetes mellitus with foot ulcer: Secondary | ICD-10-CM | POA: Diagnosis not present

## 2022-03-20 DIAGNOSIS — D509 Iron deficiency anemia, unspecified: Secondary | ICD-10-CM | POA: Diagnosis not present

## 2022-03-20 DIAGNOSIS — Z872 Personal history of diseases of the skin and subcutaneous tissue: Secondary | ICD-10-CM | POA: Diagnosis not present

## 2022-03-20 DIAGNOSIS — M7989 Other specified soft tissue disorders: Secondary | ICD-10-CM | POA: Diagnosis not present

## 2022-03-20 DIAGNOSIS — J9811 Atelectasis: Secondary | ICD-10-CM | POA: Diagnosis not present

## 2022-03-20 DIAGNOSIS — L089 Local infection of the skin and subcutaneous tissue, unspecified: Secondary | ICD-10-CM | POA: Diagnosis not present

## 2022-03-20 DIAGNOSIS — E1122 Type 2 diabetes mellitus with diabetic chronic kidney disease: Secondary | ICD-10-CM | POA: Diagnosis not present

## 2022-03-20 DIAGNOSIS — E11628 Type 2 diabetes mellitus with other skin complications: Secondary | ICD-10-CM | POA: Diagnosis not present

## 2022-03-20 DIAGNOSIS — I959 Hypotension, unspecified: Secondary | ICD-10-CM | POA: Diagnosis not present

## 2022-03-20 DIAGNOSIS — M609 Myositis, unspecified: Secondary | ICD-10-CM | POA: Diagnosis not present

## 2022-03-21 DIAGNOSIS — I129 Hypertensive chronic kidney disease with stage 1 through stage 4 chronic kidney disease, or unspecified chronic kidney disease: Secondary | ICD-10-CM | POA: Diagnosis not present

## 2022-03-21 DIAGNOSIS — L089 Local infection of the skin and subcutaneous tissue, unspecified: Secondary | ICD-10-CM | POA: Diagnosis not present

## 2022-03-21 DIAGNOSIS — N281 Cyst of kidney, acquired: Secondary | ICD-10-CM | POA: Diagnosis not present

## 2022-03-21 DIAGNOSIS — L97519 Non-pressure chronic ulcer of other part of right foot with unspecified severity: Secondary | ICD-10-CM | POA: Diagnosis not present

## 2022-03-21 DIAGNOSIS — L97911 Non-pressure chronic ulcer of unspecified part of right lower leg limited to breakdown of skin: Secondary | ICD-10-CM | POA: Diagnosis not present

## 2022-03-21 DIAGNOSIS — I4891 Unspecified atrial fibrillation: Secondary | ICD-10-CM | POA: Diagnosis not present

## 2022-03-21 DIAGNOSIS — N400 Enlarged prostate without lower urinary tract symptoms: Secondary | ICD-10-CM | POA: Diagnosis not present

## 2022-03-21 DIAGNOSIS — E11628 Type 2 diabetes mellitus with other skin complications: Secondary | ICD-10-CM | POA: Diagnosis not present

## 2022-03-21 DIAGNOSIS — J811 Chronic pulmonary edema: Secondary | ICD-10-CM | POA: Diagnosis not present

## 2022-03-21 DIAGNOSIS — E1140 Type 2 diabetes mellitus with diabetic neuropathy, unspecified: Secondary | ICD-10-CM | POA: Diagnosis not present

## 2022-03-21 DIAGNOSIS — Z872 Personal history of diseases of the skin and subcutaneous tissue: Secondary | ICD-10-CM | POA: Diagnosis not present

## 2022-03-21 DIAGNOSIS — N189 Chronic kidney disease, unspecified: Secondary | ICD-10-CM | POA: Diagnosis not present

## 2022-03-21 DIAGNOSIS — M869 Osteomyelitis, unspecified: Secondary | ICD-10-CM | POA: Diagnosis not present

## 2022-03-21 DIAGNOSIS — E11621 Type 2 diabetes mellitus with foot ulcer: Secondary | ICD-10-CM | POA: Diagnosis not present

## 2022-03-21 DIAGNOSIS — A419 Sepsis, unspecified organism: Secondary | ICD-10-CM | POA: Diagnosis not present

## 2022-03-21 DIAGNOSIS — E785 Hyperlipidemia, unspecified: Secondary | ICD-10-CM | POA: Diagnosis not present

## 2022-03-21 DIAGNOSIS — J810 Acute pulmonary edema: Secondary | ICD-10-CM | POA: Diagnosis not present

## 2022-03-21 DIAGNOSIS — N390 Urinary tract infection, site not specified: Secondary | ICD-10-CM | POA: Diagnosis not present

## 2022-03-21 DIAGNOSIS — E1122 Type 2 diabetes mellitus with diabetic chronic kidney disease: Secondary | ICD-10-CM | POA: Diagnosis not present

## 2022-03-21 DIAGNOSIS — T83511A Infection and inflammatory reaction due to indwelling urethral catheter, initial encounter: Secondary | ICD-10-CM | POA: Diagnosis not present

## 2022-03-22 DIAGNOSIS — I129 Hypertensive chronic kidney disease with stage 1 through stage 4 chronic kidney disease, or unspecified chronic kidney disease: Secondary | ICD-10-CM | POA: Diagnosis not present

## 2022-03-22 DIAGNOSIS — N189 Chronic kidney disease, unspecified: Secondary | ICD-10-CM | POA: Diagnosis not present

## 2022-03-22 DIAGNOSIS — E785 Hyperlipidemia, unspecified: Secondary | ICD-10-CM | POA: Diagnosis not present

## 2022-03-22 DIAGNOSIS — J811 Chronic pulmonary edema: Secondary | ICD-10-CM | POA: Diagnosis not present

## 2022-03-22 DIAGNOSIS — E1122 Type 2 diabetes mellitus with diabetic chronic kidney disease: Secondary | ICD-10-CM | POA: Diagnosis not present

## 2022-03-22 DIAGNOSIS — L97911 Non-pressure chronic ulcer of unspecified part of right lower leg limited to breakdown of skin: Secondary | ICD-10-CM | POA: Diagnosis not present

## 2022-03-22 DIAGNOSIS — E1140 Type 2 diabetes mellitus with diabetic neuropathy, unspecified: Secondary | ICD-10-CM | POA: Diagnosis not present

## 2022-03-22 DIAGNOSIS — J810 Acute pulmonary edema: Secondary | ICD-10-CM | POA: Diagnosis not present

## 2022-03-22 DIAGNOSIS — E11628 Type 2 diabetes mellitus with other skin complications: Secondary | ICD-10-CM | POA: Diagnosis not present

## 2022-03-22 DIAGNOSIS — T83511A Infection and inflammatory reaction due to indwelling urethral catheter, initial encounter: Secondary | ICD-10-CM | POA: Diagnosis not present

## 2022-03-22 DIAGNOSIS — A419 Sepsis, unspecified organism: Secondary | ICD-10-CM | POA: Diagnosis not present

## 2022-03-22 DIAGNOSIS — N390 Urinary tract infection, site not specified: Secondary | ICD-10-CM | POA: Diagnosis not present

## 2022-03-22 DIAGNOSIS — N281 Cyst of kidney, acquired: Secondary | ICD-10-CM | POA: Diagnosis not present

## 2022-03-22 DIAGNOSIS — I4891 Unspecified atrial fibrillation: Secondary | ICD-10-CM | POA: Diagnosis not present

## 2022-03-22 DIAGNOSIS — N400 Enlarged prostate without lower urinary tract symptoms: Secondary | ICD-10-CM | POA: Diagnosis not present

## 2022-03-22 DIAGNOSIS — M869 Osteomyelitis, unspecified: Secondary | ICD-10-CM | POA: Diagnosis not present

## 2022-03-22 DIAGNOSIS — L089 Local infection of the skin and subcutaneous tissue, unspecified: Secondary | ICD-10-CM | POA: Diagnosis not present

## 2022-03-22 DIAGNOSIS — L03115 Cellulitis of right lower limb: Secondary | ICD-10-CM | POA: Diagnosis not present

## 2022-03-23 DIAGNOSIS — I4891 Unspecified atrial fibrillation: Secondary | ICD-10-CM | POA: Diagnosis not present

## 2022-03-23 DIAGNOSIS — N281 Cyst of kidney, acquired: Secondary | ICD-10-CM | POA: Diagnosis not present

## 2022-03-23 DIAGNOSIS — E785 Hyperlipidemia, unspecified: Secondary | ICD-10-CM | POA: Diagnosis not present

## 2022-03-23 DIAGNOSIS — N400 Enlarged prostate without lower urinary tract symptoms: Secondary | ICD-10-CM | POA: Diagnosis not present

## 2022-03-23 DIAGNOSIS — J811 Chronic pulmonary edema: Secondary | ICD-10-CM | POA: Diagnosis not present

## 2022-03-23 DIAGNOSIS — A419 Sepsis, unspecified organism: Secondary | ICD-10-CM | POA: Diagnosis not present

## 2022-03-23 DIAGNOSIS — N390 Urinary tract infection, site not specified: Secondary | ICD-10-CM | POA: Diagnosis not present

## 2022-03-23 DIAGNOSIS — N189 Chronic kidney disease, unspecified: Secondary | ICD-10-CM | POA: Diagnosis not present

## 2022-03-23 DIAGNOSIS — I959 Hypotension, unspecified: Secondary | ICD-10-CM | POA: Diagnosis not present

## 2022-03-23 DIAGNOSIS — J810 Acute pulmonary edema: Secondary | ICD-10-CM | POA: Diagnosis not present

## 2022-03-23 DIAGNOSIS — E11621 Type 2 diabetes mellitus with foot ulcer: Secondary | ICD-10-CM | POA: Diagnosis not present

## 2022-03-23 DIAGNOSIS — M869 Osteomyelitis, unspecified: Secondary | ICD-10-CM | POA: Diagnosis not present

## 2022-03-23 DIAGNOSIS — L97911 Non-pressure chronic ulcer of unspecified part of right lower leg limited to breakdown of skin: Secondary | ICD-10-CM | POA: Diagnosis not present

## 2022-03-23 DIAGNOSIS — I129 Hypertensive chronic kidney disease with stage 1 through stage 4 chronic kidney disease, or unspecified chronic kidney disease: Secondary | ICD-10-CM | POA: Diagnosis not present

## 2022-03-23 DIAGNOSIS — E1140 Type 2 diabetes mellitus with diabetic neuropathy, unspecified: Secondary | ICD-10-CM | POA: Diagnosis not present

## 2022-03-23 DIAGNOSIS — E1122 Type 2 diabetes mellitus with diabetic chronic kidney disease: Secondary | ICD-10-CM | POA: Diagnosis not present

## 2022-03-23 DIAGNOSIS — E11628 Type 2 diabetes mellitus with other skin complications: Secondary | ICD-10-CM | POA: Diagnosis not present

## 2022-03-23 DIAGNOSIS — T83511A Infection and inflammatory reaction due to indwelling urethral catheter, initial encounter: Secondary | ICD-10-CM | POA: Diagnosis not present

## 2022-03-23 DIAGNOSIS — L97519 Non-pressure chronic ulcer of other part of right foot with unspecified severity: Secondary | ICD-10-CM | POA: Diagnosis not present

## 2022-03-23 DIAGNOSIS — L089 Local infection of the skin and subcutaneous tissue, unspecified: Secondary | ICD-10-CM | POA: Diagnosis not present

## 2022-03-24 DIAGNOSIS — M869 Osteomyelitis, unspecified: Secondary | ICD-10-CM | POA: Diagnosis not present

## 2022-03-24 DIAGNOSIS — E11628 Type 2 diabetes mellitus with other skin complications: Secondary | ICD-10-CM | POA: Diagnosis not present

## 2022-03-24 DIAGNOSIS — L97911 Non-pressure chronic ulcer of unspecified part of right lower leg limited to breakdown of skin: Secondary | ICD-10-CM | POA: Diagnosis not present

## 2022-03-24 DIAGNOSIS — N189 Chronic kidney disease, unspecified: Secondary | ICD-10-CM | POA: Diagnosis not present

## 2022-03-24 DIAGNOSIS — E1140 Type 2 diabetes mellitus with diabetic neuropathy, unspecified: Secondary | ICD-10-CM | POA: Diagnosis not present

## 2022-03-24 DIAGNOSIS — J811 Chronic pulmonary edema: Secondary | ICD-10-CM | POA: Diagnosis not present

## 2022-03-24 DIAGNOSIS — L089 Local infection of the skin and subcutaneous tissue, unspecified: Secondary | ICD-10-CM | POA: Diagnosis not present

## 2022-03-24 DIAGNOSIS — N281 Cyst of kidney, acquired: Secondary | ICD-10-CM | POA: Diagnosis not present

## 2022-03-24 DIAGNOSIS — I129 Hypertensive chronic kidney disease with stage 1 through stage 4 chronic kidney disease, or unspecified chronic kidney disease: Secondary | ICD-10-CM | POA: Diagnosis not present

## 2022-03-24 DIAGNOSIS — R0602 Shortness of breath: Secondary | ICD-10-CM | POA: Diagnosis not present

## 2022-03-24 DIAGNOSIS — N400 Enlarged prostate without lower urinary tract symptoms: Secondary | ICD-10-CM | POA: Diagnosis not present

## 2022-03-24 DIAGNOSIS — E1122 Type 2 diabetes mellitus with diabetic chronic kidney disease: Secondary | ICD-10-CM | POA: Diagnosis not present

## 2022-03-24 DIAGNOSIS — E785 Hyperlipidemia, unspecified: Secondary | ICD-10-CM | POA: Diagnosis not present

## 2022-03-24 DIAGNOSIS — J810 Acute pulmonary edema: Secondary | ICD-10-CM | POA: Diagnosis not present

## 2022-03-24 DIAGNOSIS — I4891 Unspecified atrial fibrillation: Secondary | ICD-10-CM | POA: Diagnosis not present

## 2022-03-24 DIAGNOSIS — A419 Sepsis, unspecified organism: Secondary | ICD-10-CM | POA: Diagnosis not present

## 2022-03-24 DIAGNOSIS — N390 Urinary tract infection, site not specified: Secondary | ICD-10-CM | POA: Diagnosis not present

## 2022-03-24 DIAGNOSIS — T83511A Infection and inflammatory reaction due to indwelling urethral catheter, initial encounter: Secondary | ICD-10-CM | POA: Diagnosis not present

## 2022-03-25 DIAGNOSIS — R0602 Shortness of breath: Secondary | ICD-10-CM | POA: Diagnosis not present

## 2022-03-25 DIAGNOSIS — E6609 Other obesity due to excess calories: Secondary | ICD-10-CM | POA: Diagnosis not present

## 2022-03-25 DIAGNOSIS — E785 Hyperlipidemia, unspecified: Secondary | ICD-10-CM | POA: Diagnosis not present

## 2022-03-25 DIAGNOSIS — T83511A Infection and inflammatory reaction due to indwelling urethral catheter, initial encounter: Secondary | ICD-10-CM | POA: Diagnosis not present

## 2022-03-25 DIAGNOSIS — L97911 Non-pressure chronic ulcer of unspecified part of right lower leg limited to breakdown of skin: Secondary | ICD-10-CM | POA: Diagnosis not present

## 2022-03-25 DIAGNOSIS — E11621 Type 2 diabetes mellitus with foot ulcer: Secondary | ICD-10-CM | POA: Diagnosis not present

## 2022-03-25 DIAGNOSIS — A419 Sepsis, unspecified organism: Secondary | ICD-10-CM | POA: Diagnosis not present

## 2022-03-25 DIAGNOSIS — J811 Chronic pulmonary edema: Secondary | ICD-10-CM | POA: Diagnosis not present

## 2022-03-25 DIAGNOSIS — L97519 Non-pressure chronic ulcer of other part of right foot with unspecified severity: Secondary | ICD-10-CM | POA: Diagnosis not present

## 2022-03-25 DIAGNOSIS — J810 Acute pulmonary edema: Secondary | ICD-10-CM | POA: Diagnosis not present

## 2022-03-25 DIAGNOSIS — E1122 Type 2 diabetes mellitus with diabetic chronic kidney disease: Secondary | ICD-10-CM | POA: Diagnosis not present

## 2022-03-25 DIAGNOSIS — M869 Osteomyelitis, unspecified: Secondary | ICD-10-CM | POA: Diagnosis not present

## 2022-03-25 DIAGNOSIS — N390 Urinary tract infection, site not specified: Secondary | ICD-10-CM | POA: Diagnosis not present

## 2022-03-25 DIAGNOSIS — E1140 Type 2 diabetes mellitus with diabetic neuropathy, unspecified: Secondary | ICD-10-CM | POA: Diagnosis not present

## 2022-03-25 DIAGNOSIS — N183 Chronic kidney disease, stage 3 unspecified: Secondary | ICD-10-CM | POA: Diagnosis not present

## 2022-03-25 DIAGNOSIS — I129 Hypertensive chronic kidney disease with stage 1 through stage 4 chronic kidney disease, or unspecified chronic kidney disease: Secondary | ICD-10-CM | POA: Diagnosis not present

## 2022-03-25 DIAGNOSIS — D509 Iron deficiency anemia, unspecified: Secondary | ICD-10-CM | POA: Diagnosis not present

## 2022-03-25 DIAGNOSIS — I700 Atherosclerosis of aorta: Secondary | ICD-10-CM | POA: Diagnosis not present

## 2022-03-25 DIAGNOSIS — N281 Cyst of kidney, acquired: Secondary | ICD-10-CM | POA: Diagnosis not present

## 2022-04-02 DIAGNOSIS — Z6838 Body mass index (BMI) 38.0-38.9, adult: Secondary | ICD-10-CM | POA: Diagnosis not present

## 2022-04-02 DIAGNOSIS — L0291 Cutaneous abscess, unspecified: Secondary | ICD-10-CM | POA: Diagnosis not present

## 2022-04-02 DIAGNOSIS — Z299 Encounter for prophylactic measures, unspecified: Secondary | ICD-10-CM | POA: Diagnosis not present

## 2022-04-02 DIAGNOSIS — L0390 Cellulitis, unspecified: Secondary | ICD-10-CM | POA: Diagnosis not present

## 2022-04-02 DIAGNOSIS — I10 Essential (primary) hypertension: Secondary | ICD-10-CM | POA: Diagnosis not present

## 2022-04-02 DIAGNOSIS — E1165 Type 2 diabetes mellitus with hyperglycemia: Secondary | ICD-10-CM | POA: Diagnosis not present

## 2022-04-08 DIAGNOSIS — L97412 Non-pressure chronic ulcer of right heel and midfoot with fat layer exposed: Secondary | ICD-10-CM | POA: Diagnosis not present

## 2022-04-08 DIAGNOSIS — F419 Anxiety disorder, unspecified: Secondary | ICD-10-CM | POA: Diagnosis not present

## 2022-04-08 DIAGNOSIS — E11621 Type 2 diabetes mellitus with foot ulcer: Secondary | ICD-10-CM | POA: Diagnosis not present

## 2022-04-08 DIAGNOSIS — Z89512 Acquired absence of left leg below knee: Secondary | ICD-10-CM | POA: Diagnosis not present

## 2022-04-08 DIAGNOSIS — I129 Hypertensive chronic kidney disease with stage 1 through stage 4 chronic kidney disease, or unspecified chronic kidney disease: Secondary | ICD-10-CM | POA: Diagnosis not present

## 2022-04-08 DIAGNOSIS — N400 Enlarged prostate without lower urinary tract symptoms: Secondary | ICD-10-CM | POA: Diagnosis not present

## 2022-04-08 DIAGNOSIS — E1122 Type 2 diabetes mellitus with diabetic chronic kidney disease: Secondary | ICD-10-CM | POA: Diagnosis not present

## 2022-04-08 DIAGNOSIS — E7800 Pure hypercholesterolemia, unspecified: Secondary | ICD-10-CM | POA: Diagnosis not present

## 2022-04-08 DIAGNOSIS — N189 Chronic kidney disease, unspecified: Secondary | ICD-10-CM | POA: Diagnosis not present

## 2022-04-10 DIAGNOSIS — R82998 Other abnormal findings in urine: Secondary | ICD-10-CM | POA: Diagnosis not present

## 2022-04-10 DIAGNOSIS — N401 Enlarged prostate with lower urinary tract symptoms: Secondary | ICD-10-CM | POA: Diagnosis not present

## 2022-04-10 DIAGNOSIS — R310 Gross hematuria: Secondary | ICD-10-CM | POA: Diagnosis not present

## 2022-04-10 DIAGNOSIS — R338 Other retention of urine: Secondary | ICD-10-CM | POA: Diagnosis not present

## 2022-04-15 DIAGNOSIS — R310 Gross hematuria: Secondary | ICD-10-CM | POA: Diagnosis not present

## 2022-04-15 DIAGNOSIS — N401 Enlarged prostate with lower urinary tract symptoms: Secondary | ICD-10-CM | POA: Diagnosis not present

## 2022-04-15 DIAGNOSIS — C61 Malignant neoplasm of prostate: Secondary | ICD-10-CM | POA: Diagnosis not present

## 2022-04-15 DIAGNOSIS — R338 Other retention of urine: Secondary | ICD-10-CM | POA: Diagnosis not present

## 2022-04-30 ENCOUNTER — Other Ambulatory Visit (HOSPITAL_COMMUNITY): Payer: Medicare PPO

## 2022-04-30 ENCOUNTER — Inpatient Hospital Stay (HOSPITAL_COMMUNITY): Payer: Medicare PPO

## 2022-05-01 ENCOUNTER — Inpatient Hospital Stay (HOSPITAL_COMMUNITY): Payer: Medicare PPO | Attending: Hematology

## 2022-05-01 DIAGNOSIS — R7989 Other specified abnormal findings of blood chemistry: Secondary | ICD-10-CM | POA: Insufficient documentation

## 2022-05-01 DIAGNOSIS — C61 Malignant neoplasm of prostate: Secondary | ICD-10-CM | POA: Diagnosis not present

## 2022-05-01 DIAGNOSIS — N1832 Chronic kidney disease, stage 3b: Secondary | ICD-10-CM | POA: Insufficient documentation

## 2022-05-01 DIAGNOSIS — Z800 Family history of malignant neoplasm of digestive organs: Secondary | ICD-10-CM | POA: Diagnosis not present

## 2022-05-01 DIAGNOSIS — I129 Hypertensive chronic kidney disease with stage 1 through stage 4 chronic kidney disease, or unspecified chronic kidney disease: Secondary | ICD-10-CM | POA: Diagnosis not present

## 2022-05-01 DIAGNOSIS — D472 Monoclonal gammopathy: Secondary | ICD-10-CM | POA: Diagnosis not present

## 2022-05-01 DIAGNOSIS — E1140 Type 2 diabetes mellitus with diabetic neuropathy, unspecified: Secondary | ICD-10-CM | POA: Diagnosis not present

## 2022-05-01 DIAGNOSIS — Z808 Family history of malignant neoplasm of other organs or systems: Secondary | ICD-10-CM | POA: Insufficient documentation

## 2022-05-01 DIAGNOSIS — E1122 Type 2 diabetes mellitus with diabetic chronic kidney disease: Secondary | ICD-10-CM | POA: Insufficient documentation

## 2022-05-01 LAB — COMPREHENSIVE METABOLIC PANEL
ALT: 13 U/L (ref 0–44)
AST: 14 U/L — ABNORMAL LOW (ref 15–41)
Albumin: 3.5 g/dL (ref 3.5–5.0)
Alkaline Phosphatase: 56 U/L (ref 38–126)
Anion gap: 10 (ref 5–15)
BUN: 42 mg/dL — ABNORMAL HIGH (ref 8–23)
CO2: 25 mmol/L (ref 22–32)
Calcium: 8.8 mg/dL — ABNORMAL LOW (ref 8.9–10.3)
Chloride: 98 mmol/L (ref 98–111)
Creatinine, Ser: 2.52 mg/dL — ABNORMAL HIGH (ref 0.61–1.24)
GFR, Estimated: 26 mL/min — ABNORMAL LOW (ref >60)
Glucose, Bld: 312 mg/dL — ABNORMAL HIGH (ref 70–99)
Potassium: 3.8 mmol/L (ref 3.5–5.1)
Sodium: 133 mmol/L — ABNORMAL LOW (ref 135–145)
Total Bilirubin: 0.5 mg/dL (ref 0.3–1.2)
Total Protein: 6.7 g/dL (ref 6.5–8.1)

## 2022-05-01 LAB — CBC WITH DIFFERENTIAL/PLATELET
Abs Immature Granulocytes: 0.05 10*3/uL (ref 0.00–0.07)
Basophils Absolute: 0.1 10*3/uL (ref 0.0–0.1)
Basophils Relative: 1 %
Eosinophils Absolute: 0.4 10*3/uL (ref 0.0–0.5)
Eosinophils Relative: 4 %
HCT: 40.8 % (ref 39.0–52.0)
Hemoglobin: 13.5 g/dL (ref 13.0–17.0)
Immature Granulocytes: 0 %
Lymphocytes Relative: 22 %
Lymphs Abs: 2.5 10*3/uL (ref 0.7–4.0)
MCH: 29.8 pg (ref 26.0–34.0)
MCHC: 33.1 g/dL (ref 30.0–36.0)
MCV: 90.1 fL (ref 80.0–100.0)
Monocytes Absolute: 1 10*3/uL (ref 0.1–1.0)
Monocytes Relative: 8 %
Neutro Abs: 7.4 10*3/uL (ref 1.7–7.7)
Neutrophils Relative %: 65 %
Platelets: 246 10*3/uL (ref 150–400)
RBC: 4.53 MIL/uL (ref 4.22–5.81)
RDW: 14.8 % (ref 11.5–15.5)
WBC: 11.4 10*3/uL — ABNORMAL HIGH (ref 4.0–10.5)
nRBC: 0 % (ref 0.0–0.2)

## 2022-05-02 LAB — KAPPA/LAMBDA LIGHT CHAINS
Kappa free light chain: 106.6 mg/L — ABNORMAL HIGH (ref 3.3–19.4)
Kappa, lambda light chain ratio: 3.95 — ABNORMAL HIGH (ref 0.26–1.65)
Lambda free light chains: 27 mg/L — ABNORMAL HIGH (ref 5.7–26.3)

## 2022-05-06 DIAGNOSIS — G4730 Sleep apnea, unspecified: Secondary | ICD-10-CM | POA: Diagnosis not present

## 2022-05-06 DIAGNOSIS — Z789 Other specified health status: Secondary | ICD-10-CM | POA: Diagnosis not present

## 2022-05-06 DIAGNOSIS — E1122 Type 2 diabetes mellitus with diabetic chronic kidney disease: Secondary | ICD-10-CM | POA: Diagnosis not present

## 2022-05-06 DIAGNOSIS — I10 Essential (primary) hypertension: Secondary | ICD-10-CM | POA: Diagnosis not present

## 2022-05-06 DIAGNOSIS — Z299 Encounter for prophylactic measures, unspecified: Secondary | ICD-10-CM | POA: Diagnosis not present

## 2022-05-06 DIAGNOSIS — E1165 Type 2 diabetes mellitus with hyperglycemia: Secondary | ICD-10-CM | POA: Diagnosis not present

## 2022-05-06 DIAGNOSIS — Z6838 Body mass index (BMI) 38.0-38.9, adult: Secondary | ICD-10-CM | POA: Diagnosis not present

## 2022-05-06 DIAGNOSIS — I4891 Unspecified atrial fibrillation: Secondary | ICD-10-CM | POA: Diagnosis not present

## 2022-05-06 DIAGNOSIS — N184 Chronic kidney disease, stage 4 (severe): Secondary | ICD-10-CM | POA: Diagnosis not present

## 2022-05-06 DIAGNOSIS — G4700 Insomnia, unspecified: Secondary | ICD-10-CM | POA: Diagnosis not present

## 2022-05-07 ENCOUNTER — Ambulatory Visit (HOSPITAL_COMMUNITY): Payer: Medicare PPO | Admitting: Hematology

## 2022-05-07 ENCOUNTER — Inpatient Hospital Stay (HOSPITAL_COMMUNITY): Payer: Medicare PPO | Admitting: Hematology

## 2022-05-07 VITALS — HR 64 | Temp 98.1°F | Resp 18 | Wt 248.4 lb

## 2022-05-07 DIAGNOSIS — D631 Anemia in chronic kidney disease: Secondary | ICD-10-CM | POA: Diagnosis not present

## 2022-05-07 DIAGNOSIS — E1140 Type 2 diabetes mellitus with diabetic neuropathy, unspecified: Secondary | ICD-10-CM | POA: Diagnosis not present

## 2022-05-07 DIAGNOSIS — Z800 Family history of malignant neoplasm of digestive organs: Secondary | ICD-10-CM | POA: Diagnosis not present

## 2022-05-07 DIAGNOSIS — C61 Malignant neoplasm of prostate: Secondary | ICD-10-CM | POA: Diagnosis not present

## 2022-05-07 DIAGNOSIS — N170 Acute kidney failure with tubular necrosis: Secondary | ICD-10-CM | POA: Diagnosis not present

## 2022-05-07 DIAGNOSIS — R7989 Other specified abnormal findings of blood chemistry: Secondary | ICD-10-CM | POA: Diagnosis not present

## 2022-05-07 DIAGNOSIS — D472 Monoclonal gammopathy: Secondary | ICD-10-CM

## 2022-05-07 DIAGNOSIS — E1122 Type 2 diabetes mellitus with diabetic chronic kidney disease: Secondary | ICD-10-CM | POA: Diagnosis not present

## 2022-05-07 DIAGNOSIS — Z808 Family history of malignant neoplasm of other organs or systems: Secondary | ICD-10-CM | POA: Diagnosis not present

## 2022-05-07 DIAGNOSIS — I739 Peripheral vascular disease, unspecified: Secondary | ICD-10-CM | POA: Diagnosis not present

## 2022-05-07 DIAGNOSIS — I129 Hypertensive chronic kidney disease with stage 1 through stage 4 chronic kidney disease, or unspecified chronic kidney disease: Secondary | ICD-10-CM | POA: Diagnosis not present

## 2022-05-07 DIAGNOSIS — E871 Hypo-osmolality and hyponatremia: Secondary | ICD-10-CM | POA: Diagnosis not present

## 2022-05-07 DIAGNOSIS — E211 Secondary hyperparathyroidism, not elsewhere classified: Secondary | ICD-10-CM | POA: Diagnosis not present

## 2022-05-07 DIAGNOSIS — N1832 Chronic kidney disease, stage 3b: Secondary | ICD-10-CM | POA: Diagnosis not present

## 2022-05-07 NOTE — Progress Notes (Signed)
[name] [name], [place] [address]   CLINIC:  Medical Oncology/Hematology  PCP:  [name] [name], [name] [name] [id number]  [phone]  REASON FOR VISIT:  Follow-up for MGUS  PRIOR THERAPY: none  CURRENT THERAPY: surveillance  INTERVAL HISTORY:  [name], a 76 y.o. male, returns for routine follow-up for his MGUS. Jiyaan was last seen on 11/06/2021.  Today he reports feeling well. He reports fatigue and SOB. He has been taking Lasix 4 times daily, but he ran out and has not done so in 2 weeks.   REVIEW OF SYSTEMS:  Review of Systems  Constitutional:  Positive for fatigue. Negative for appetite change.  Respiratory:  Positive for shortness of breath.   Musculoskeletal:  Positive for arthralgias (4/10 L foot).  Neurological:  Positive for dizziness.  Psychiatric/Behavioral:  Positive for depression. The patient is nervous/anxious.   All other systems reviewed and are negative.   PAST MEDICAL/SURGICAL HISTORY:  Past Medical History:  Diagnosis Date   Anemia    Anxiety    Cancer ([place])    Prostate- Stage I   Critical lower limb ischemia (HCC)    Essential hypertension    Gangrene of foot [facility]) March 2016   Left foot   Hyperlipidemia    Neuropathy    Type 2 diabetes mellitus ([place])    Past Surgical History:  Procedure Laterality Date   AMPUTATION Left 01/12/2015   Procedure: AMPUTATION BELOW KNEE;  Surgeon: Leandrew Koyanagi, MD;  Location: [place];  Service: Orthopedics;  Laterality: Left;   APPLICATION OF WOUND VAC Right 01/12/2015   Procedure: APPLICATION OF WOUND VAC;  Surgeon: Leandrew Koyanagi, MD;  Location: [place];  Service: Orthopedics;  Laterality: Right;   CARDIAC ELECTROPHYSIOLOGY STUDY AND ABLATION     CHOLECYSTECTOMY     ORIF HUMERUS FRACTURE      SOCIAL HISTORY:  Social History   Socioeconomic History   Marital status: Single    Spouse name: Not on file   Number of children: 0   Years of education: Not on  file   Highest education level: Not on file  Occupational History   Occupation: RETIRED  Tobacco Use   Smoking status: Never   Smokeless tobacco: Never  Vaping Use   Vaping Use: Never used  Substance and Sexual Activity   Alcohol use: Yes    Alcohol/week: 0.0 standard drinks of alcohol    Comment: rare   Drug use: No   Sexual activity: Not Currently  Other Topics Concern   Not on file  Social History Narrative   Not on file   Social Determinants of Health   Financial Resource Strain: Medium Risk (03/13/2020)   Overall Financial Resource Strain (CARDIA)    Difficulty of Paying Living Expenses: Somewhat hard  Food Insecurity: Food Insecurity Present (03/13/2020)   Hunger Vital Sign    Worried About Running Out of Food in the Last Year: Sometimes true    Ran Out of Food in the Last Year: Sometimes true  Transportation Needs: Unmet Transportation Needs (03/13/2020)   PRAPARE - Transportation    Lack of Transportation (Medical): Yes    Lack of Transportation (Non-Medical): Yes  Physical Activity: Inactive (03/13/2020)   Exercise Vital Sign    Days of Exercise per Week: 0 days    Minutes of Exercise per Session: 0 min  Stress: No Stress Concern Present (03/13/2020)   [place]  Stress Questionnaire    Feeling of Stress : Not at all  Social Connections: Socially Isolated (03/13/2020)   Social Connection and Isolation Panel [NHANES]    Frequency of Communication with Friends and Family: More than three times a week    Frequency of Social Gatherings with Friends and Family: Twice a week    Attends Religious Services: Never    Marine scientist or Organizations: No    Attends Archivist Meetings: Never    Marital Status: Never married  Intimate Partner Violence: Not At Risk (03/13/2020)   Humiliation, Afraid, Rape, and Kick questionnaire    Fear of Current or Ex-Partner: No    Emotionally Abused: No    Physically Abused:  No    Sexually Abused: No    FAMILY HISTORY:  Family History  Problem Relation Age of Onset   Hypertension Father        and kidney failure   Heart disease Father    Heart attack Father    Kidney disease Father    Alzheimer's disease Mother    Heart attack Maternal Grandfather    Heart attack Paternal Grandfather    Colon cancer Paternal Aunt 72       s/p surgical rsxn; lived to 76 yrs old   Hypertension Sister    Hypercholesterolemia Sister    Cervical cancer Sister    Gastric cancer Neg Hx    Esophageal cancer Neg Hx     CURRENT MEDICATIONS:  Current Outpatient Medications  Medication Sig Dispense Refill   ALPRAZolam (XANAX) 1 MG tablet Take 1.5 mg by mouth at bedtime.     ascorbic acid (VITAMIN C) 1000 MG tablet Take 1,000 mg by mouth daily in the afternoon.      cadexomer iodine (IODOSORB) 0.9 % gel Apply 1 application  topically daily as needed for wound care.     calcitRIOL (ROCALTROL) 0.25 MCG capsule Take by mouth.     Calcium Carbonate (CALCIUM 600 PO) Take 600 mg by mouth daily in the afternoon.     finasteride (PROSCAR) 5 MG tablet Take 5 mg by mouth daily in the afternoon.      furosemide (LASIX) 40 MG tablet Take 40 mg by mouth 2 (two) times daily.     gabapentin (NEURONTIN) 300 MG capsule Take 600 mg by mouth 2 (two) times daily as needed (pain.).     glimepiride (AMARYL) 4 MG tablet Take 4 mg by mouth daily in the afternoon.      hydrALAZINE (APRESOLINE) 50 MG tablet Take 50 mg by mouth 2 (two) times daily.     HYDROcodone-acetaminophen (NORCO/VICODIN) 5-325 MG tablet Take 1 tablet by mouth 2 (two) times daily as needed.     losartan (COZAAR) 50 MG tablet Take 50 mg by mouth daily.     metoprolol (LOPRESSOR) 100 MG tablet Take 100 mg by mouth 2 (two) times daily.     Multiple Vitamins-Minerals (MULTIVITAMIN WITH MINERALS) tablet Take 1 tablet by mouth daily in the afternoon.      Omega-3 Fatty Acids (FISH OIL) 1200 MG CAPS Take 1,200 mg by mouth daily in the  afternoon.     potassium chloride (KLOR-CON) 10 MEQ tablet Take 10 mEq by mouth daily.     sildenafil (REVATIO) 20 MG tablet Take by mouth.     simvastatin (ZOCOR) 40 MG tablet Take 40 mg by mouth daily in the afternoon.      tamsulosin (FLOMAX) 0.4 MG CAPS capsule Take 0.4 mg  by mouth daily in the afternoon.      vitamin E 400 UNIT capsule Take 400 Units by mouth daily.     No current facility-administered medications for this visit.    ALLERGIES:  No Known Allergies  PHYSICAL EXAM:  Performance status (ECOG): 1 - Symptomatic but completely ambulatory  There were no vitals filed for this visit. Wt Readings from Last 3 Encounters:  11/06/21 230 lb (104.3 kg)  02/12/21 235 lb 9.6 oz (106.9 kg)  01/30/21 234 lb 4.8 oz (106.3 kg)   Physical Exam Vitals reviewed.  Constitutional:      Appearance: Normal appearance.     Interventions: Nasal cannula in place.     Comments: In wheelchair  Cardiovascular:     Rate and Rhythm: Normal rate and regular rhythm.     Pulses: Normal pulses.     Heart sounds: Normal heart sounds.  Pulmonary:     Effort: Pulmonary effort is normal.     Breath sounds: Normal breath sounds.  Neurological:     General: No focal deficit present.     Mental Status: He is alert and oriented to person, place, and time.  Psychiatric:        Mood and Affect: Mood normal.        Behavior: Behavior normal.     LABORATORY DATA:  I have reviewed the labs as listed.     Latest Ref Rng & Units 05/01/2022   12:17 PM 10/30/2021    1:43 PM 02/05/2021    1:16 PM  CBC  WBC 4.0 - 10.5 K/uL 11.4  12.2  10.5   Hemoglobin 13.0 - 17.0 g/dL 13.5  15.7  15.2   Hematocrit 39.0 - 52.0 % 40.8  46.5  47.0   Platelets 150 - 400 K/uL 246  232  210       Latest Ref Rng & Units 05/01/2022   12:17 PM 10/30/2021    1:43 PM 02/05/2021    1:16 PM  CMP  Glucose 70 - 99 mg/dL 312  236  122   BUN 8 - 23 mg/dL 42  51  27   Creatinine 0.61 - 1.24 mg/dL 2.52  2.04  1.60   Sodium 135 -  145 mmol/L 133  135  139   Potassium 3.5 - 5.1 mmol/L 3.8  3.7  3.7   Chloride 98 - 111 mmol/L 98  96  98   CO2 22 - 32 mmol/L '25  28  28   '$ Calcium 8.9 - 10.3 mg/dL 8.8  9.5  8.8   Total Protein 6.5 - 8.1 g/dL 6.7  7.2  6.8   Total Bilirubin 0.3 - 1.2 mg/dL 0.5  0.8  0.6   Alkaline Phos 38 - 126 U/L 56  62  56   AST 15 - 41 U/L '14  16  17   '$ ALT 0 - 44 U/L '13  15  18       '$ Component Value Date/Time   RBC 4.53 05/01/2022 1217   MCV 90.1 05/01/2022 1217   MCH 29.8 05/01/2022 1217   MCHC 33.1 05/01/2022 1217   RDW 14.8 05/01/2022 1217   LYMPHSABS 2.5 05/01/2022 1217   MONOABS 1.0 05/01/2022 1217   EOSABS 0.4 05/01/2022 1217   BASOSABS 0.1 05/01/2022 1217    DIAGNOSTIC IMAGING:  I have independently reviewed the scans and discussed with the patient. No results found.   ASSESSMENT:  1.  Monoclonal gammopathy: -Patient seen at the request of Dr.  Bhutani for further work-up of monoclonal gammopathy.  UPEP on 11/25/2019 showed poorly defined area of restricted protein mobility reactive to IgG and kappa antisera. -Labs from 03/13/2020 shows M spike is not seen.  Free light chain ratio is elevated at 3.31.  Kappa light chain 67.6, lambda light chains 20.4.  Immunofixation was unremarkable. -Skeletal survey on 03/13/2020 with no lucent lesions of myeloma.   2.  CKD: -CKD stage IIIb since 2016 secondary to diabetes and hypertension.  He has nephrotic range proteinuria. -Ultrasound of the kidneys on 12/06/2019 shows bilateral renal cyst with no obstructing lesions.  Renal cortical thickness and renal echogenicity normal bilaterally. -Kidney biopsy on 03/14/2020 consistent with arteriosclerosis with arterionephrosclerosis and FSGS.   PLAN:  1.  Elevated free light chains: - He was taking Lasix 40 mg 4 times daily but has run out of the prescription in the last few days. - Reviewed labs from 05/01/2022 which showed creatinine 2.52 and gradually is increasing.  CBC was grossly normal although  hemoglobin decreased to 13.5 from 15.7. - Kappa light chains are 106, up from 92.  Free light chain ratio is 3.95.  This goes along with elevated creatinine. - As he is feeling weak, I have recommended Feraheme x2 as his hemoglobin dropped by 2 points. - Recommend follow-up in 4 months with repeat labs including ferritin iron panel, myeloma labs.   2.  CKD: - Creatinine is 2.52 and slightly worse.  Continue follow-up with Dr. Theador Hawthorne.   3.  Prostate cancer: - Continue follow-up with Dr. Blair [facility] at [facility] and is under watchful waiting.  Orders placed this encounter:  No orders of the defined types were placed in this encounter.    Derek Jack, MD [place] [phone]   I, Thana Ates, am acting as a scribe for Dr. Derek Jack.  I, Derek Jack MD, have reviewed the above documentation for accuracy and completeness, and I agree with the above.

## 2022-05-07 NOTE — Patient Instructions (Addendum)
[place] at [facility] Discharge Instructions  You were seen and examined today by Dr. Delton Coombes.  Dr. Delton Coombes discussed your most recent lab work and your iron is low, your hemoglobin is in the lower normal range and your light chains which can be elevated due to kidney functions.  Dr. Delton Coombes is going to get you scheduled for Iron infusions to help increase your iron levels.  Follow-up as scheduled in 4 months.    Thank you for choosing [place] at [facility] to provide your oncology and hematology care.  To afford each patient quality time with our provider, please arrive at least 15 minutes before your scheduled appointment time.   If you have a lab appointment with the [facility] please come in thru the Main Entrance and check in at the main information desk.  You need to re-schedule your appointment should you arrive 10 or more minutes late.  We strive to give you quality time with our providers, and arriving late affects you and other patients whose appointments are after yours.  Also, if you no show three or more times for appointments you may be dismissed from the clinic at the providers discretion.     Again, thank you for choosing [facility].  Our hope is that these requests will decrease the amount of time that you wait before being seen by our physicians.       _____________________________________________________________  Should you have questions after your visit to [facility], please contact our office at [phone] and follow the prompts.  Our office hours are 8:00 a.m. and 4:30 p.m. Monday - Friday.  Please note that voicemails left after 4:00 p.m. may not be returned until the following business day.  We are closed weekends and major holidays.  You do have access to a nurse 24-7, just call the main number to the clinic [phone] and do not press any options, hold on the line and a  nurse will answer the phone.    For prescription refill requests, have your pharmacy contact our office and allow 72 hours.    Due to Covid, you will need to wear a mask upon entering the hospital. If you do not have a mask, a mask will be given to you at the Main Entrance upon arrival. For doctor visits, patients may have 1 support person age 74 or older with them. For treatment visits, patients can not have anyone with them due to social distancing guidelines and our immunocompromised population.

## 2022-05-14 DIAGNOSIS — N401 Enlarged prostate with lower urinary tract symptoms: Secondary | ICD-10-CM | POA: Diagnosis not present

## 2022-05-15 ENCOUNTER — Inpatient Hospital Stay (HOSPITAL_COMMUNITY): Payer: Medicare PPO

## 2022-05-15 VITALS — BP 119/69 | HR 55 | Temp 97.9°F | Resp 18

## 2022-05-15 DIAGNOSIS — I129 Hypertensive chronic kidney disease with stage 1 through stage 4 chronic kidney disease, or unspecified chronic kidney disease: Secondary | ICD-10-CM | POA: Diagnosis not present

## 2022-05-15 DIAGNOSIS — N1832 Chronic kidney disease, stage 3b: Secondary | ICD-10-CM | POA: Diagnosis not present

## 2022-05-15 DIAGNOSIS — C61 Malignant neoplasm of prostate: Secondary | ICD-10-CM | POA: Diagnosis not present

## 2022-05-15 DIAGNOSIS — Z808 Family history of malignant neoplasm of other organs or systems: Secondary | ICD-10-CM | POA: Diagnosis not present

## 2022-05-15 DIAGNOSIS — D472 Monoclonal gammopathy: Secondary | ICD-10-CM | POA: Diagnosis not present

## 2022-05-15 DIAGNOSIS — D509 Iron deficiency anemia, unspecified: Secondary | ICD-10-CM

## 2022-05-15 DIAGNOSIS — E1122 Type 2 diabetes mellitus with diabetic chronic kidney disease: Secondary | ICD-10-CM | POA: Diagnosis not present

## 2022-05-15 DIAGNOSIS — Z800 Family history of malignant neoplasm of digestive organs: Secondary | ICD-10-CM | POA: Diagnosis not present

## 2022-05-15 DIAGNOSIS — R7989 Other specified abnormal findings of blood chemistry: Secondary | ICD-10-CM | POA: Diagnosis not present

## 2022-05-15 DIAGNOSIS — E1140 Type 2 diabetes mellitus with diabetic neuropathy, unspecified: Secondary | ICD-10-CM | POA: Diagnosis not present

## 2022-05-15 MED ORDER — LORATADINE 10 MG PO TABS
10.0000 mg | ORAL_TABLET | Freq: Once | ORAL | Status: AC
  Administered 2022-05-15: 10 mg via ORAL
  Filled 2022-05-15: qty 1

## 2022-05-15 MED ORDER — SODIUM CHLORIDE 0.9 % IV SOLN
300.0000 mg | Freq: Once | INTRAVENOUS | Status: AC
  Administered 2022-05-15: 300 mg via INTRAVENOUS
  Filled 2022-05-15: qty 300

## 2022-05-15 MED ORDER — SODIUM CHLORIDE 0.9 % IV SOLN: Freq: Once | INTRAVENOUS | Status: AC

## 2022-05-15 MED ORDER — ACETAMINOPHEN 325 MG PO TABS
650.0000 mg | ORAL_TABLET | Freq: Once | ORAL | Status: AC
  Administered 2022-05-15: 650 mg via ORAL
  Filled 2022-05-15: qty 2

## 2022-05-15 NOTE — Progress Notes (Signed)
Pt presents today for Venofer IV iron infusion per provider's order. Vital signs stable and pt voiced no new complaints at this time.  ? ?Peripheral IV started with good blood return pre and post infusion. ? ?Venofer 300 mg given today per MD orders. Tolerated infusion without adverse affects. Vital signs stable. No complaints at this time. Discharged from clinic via wheelchair in stable condition. Alert and oriented x 3. F/U with [facility] as scheduled.   ?

## 2022-05-15 NOTE — Patient Instructions (Signed)
[facility]  Discharge Instructions: Thank you for choosing [facility] to provide your oncology and hematology care.  If you have a lab appointment with the [facility], please come in thru the Main Entrance and check in at the main information desk.  Wear comfortable clothing and clothing appropriate for easy access to any Portacath or PICC line.   We strive to give you quality time with your provider. You may need to reschedule your appointment if you arrive late (15 or more minutes).  Arriving late affects you and other patients whose appointments are after yours.  Also, if you miss three or more appointments without notifying the office, you may be dismissed from the clinic at the provider's discretion.      For prescription refill requests, have your pharmacy contact our office and allow 72 hours for refills to be completed.    Today you received the following chemotherapy and/or immunotherapy agents Venofer IV iron   BELOW ARE SYMPTOMS THAT SHOULD BE REPORTED IMMEDIATELY: *FEVER GREATER THAN 100.4 F (38 C) OR HIGHER *CHILLS OR SWEATING *NAUSEA AND VOMITING THAT IS NOT CONTROLLED WITH YOUR NAUSEA MEDICATION *UNUSUAL SHORTNESS OF BREATH *UNUSUAL BRUISING OR BLEEDING *URINARY PROBLEMS (pain or burning when urinating, or frequent urination) *BOWEL PROBLEMS (unusual diarrhea, constipation, pain near the anus) TENDERNESS IN MOUTH AND THROAT WITH OR WITHOUT PRESENCE OF ULCERS (sore throat, sores in mouth, or a toothache) UNUSUAL RASH, SWELLING OR PAIN  UNUSUAL VAGINAL DISCHARGE OR ITCHING   Items with * indicate a potential emergency and should be followed up as soon as possible or go to the Emergency Department if any problems should occur.  Please show the CHEMOTHERAPY ALERT CARD or IMMUNOTHERAPY ALERT CARD at check-in to the Emergency Department and triage nurse.  Should you have questions after your visit or need to cancel or reschedule your appointment,  please contact [facility] [phone]  and follow the prompts.  Office hours are 8:00 a.m. to 4:30 p.m. Monday - Friday. Please note that voicemails left after 4:00 p.m. may not be returned until the following business day.  We are closed weekends and major holidays. You have access to a nurse at all times for urgent questions. Please call the main number to the clinic [phone] and follow the prompts.  For any non-urgent questions, you may also contact your provider using MyChart. We now offer e-Visits for anyone 53 and older to request care online for non-urgent symptoms. For details visit [email or web address].   Also download the MyChart app! Go to the app store, search "MyChart", open the app, select [facility], and log in with your MyChart username and password.  Masks are optional in the cancer centers. If you would like for your care team to wear a mask while they are taking care of you, please let them know. For doctor visits, patients may have with them one support person who is at least 76 years old. At this time, visitors are not allowed in the infusion area.

## 2022-05-27 ENCOUNTER — Inpatient Hospital Stay: Payer: Medicare PPO | Attending: Hematology

## 2022-05-27 VITALS — BP 107/64 | HR 58 | Temp 97.7°F | Resp 18

## 2022-05-27 DIAGNOSIS — D509 Iron deficiency anemia, unspecified: Secondary | ICD-10-CM

## 2022-05-27 DIAGNOSIS — C61 Malignant neoplasm of prostate: Secondary | ICD-10-CM | POA: Diagnosis not present

## 2022-05-27 DIAGNOSIS — D472 Monoclonal gammopathy: Secondary | ICD-10-CM | POA: Diagnosis not present

## 2022-05-27 DIAGNOSIS — N1832 Chronic kidney disease, stage 3b: Secondary | ICD-10-CM | POA: Insufficient documentation

## 2022-05-27 MED ORDER — SODIUM CHLORIDE 0.9 % IV SOLN: Freq: Once | INTRAVENOUS | Status: AC

## 2022-05-27 MED ORDER — ACETAMINOPHEN 325 MG PO TABS
650.0000 mg | ORAL_TABLET | Freq: Once | ORAL | Status: AC
  Administered 2022-05-27: 650 mg via ORAL
  Filled 2022-05-27: qty 2

## 2022-05-27 MED ORDER — SODIUM CHLORIDE 0.9 % IV SOLN
300.0000 mg | Freq: Once | INTRAVENOUS | Status: AC
  Administered 2022-05-27: 300 mg via INTRAVENOUS
  Filled 2022-05-27: qty 300

## 2022-05-27 MED ORDER — LORATADINE 10 MG PO TABS
10.0000 mg | ORAL_TABLET | Freq: Once | ORAL | Status: AC
  Administered 2022-05-27: 10 mg via ORAL
  Filled 2022-05-27: qty 1

## 2022-05-27 NOTE — Progress Notes (Signed)
Pt presents today for Venofer IV iron infusion per provider's order.   Pt's blood pressure was 88/56, and the 2nd check was 85/51. Pt denies dizziness, headaches, and shortness of breath. Message sent to Dr.K and he stated to proceed with iron infusion.   Peripheral IV started with good blood return pre and post infusion.  Venofer 300 mg given today per MD orders. Tolerated infusion without adverse affects. Vital signs stable. No complaints at this time. Discharged from clinic via wheelchair in stable condition. Alert and oriented x 3. F/U with [facility] as scheduled.

## 2022-05-27 NOTE — Patient Instructions (Signed)
[facility]  Discharge Instructions: Thank you for choosing [facility] to provide your oncology and hematology care.  If you have a lab appointment with the [facility], please come in thru the Main Entrance and check in at the main information desk.  Wear comfortable clothing and clothing appropriate for easy access to any Portacath or PICC line.   We strive to give you quality time with your provider. You may need to reschedule your appointment if you arrive late (15 or more minutes).  Arriving late affects you and other patients whose appointments are after yours.  Also, if you miss three or more appointments without notifying the office, you may be dismissed from the clinic at the provider's discretion.      For prescription refill requests, have your pharmacy contact our office and allow 72 hours for refills to be completed.    Today you received the following chemotherapy and/or immunotherapy agents Feraheme IV iron infusion.      To help prevent nausea and vomiting after your treatment, we encourage you to take your nausea medication as directed.  BELOW ARE SYMPTOMS THAT SHOULD BE REPORTED IMMEDIATELY: *FEVER GREATER THAN 100.4 F (38 C) OR HIGHER *CHILLS OR SWEATING *NAUSEA AND VOMITING THAT IS NOT CONTROLLED WITH YOUR NAUSEA MEDICATION *UNUSUAL SHORTNESS OF BREATH *UNUSUAL BRUISING OR BLEEDING *URINARY PROBLEMS (pain or burning when urinating, or frequent urination) *BOWEL PROBLEMS (unusual diarrhea, constipation, pain near the anus) TENDERNESS IN MOUTH AND THROAT WITH OR WITHOUT PRESENCE OF ULCERS (sore throat, sores in mouth, or a toothache) UNUSUAL RASH, SWELLING OR PAIN  UNUSUAL VAGINAL DISCHARGE OR ITCHING   Items with * indicate a potential emergency and should be followed up as soon as possible or go to the Emergency Department if any problems should occur.  Please show the CHEMOTHERAPY ALERT CARD or IMMUNOTHERAPY ALERT CARD at  check-in to the Emergency Department and triage nurse.  Should you have questions after your visit or need to cancel or reschedule your appointment, please contact [facility] [phone]  and follow the prompts.  Office hours are 8:00 a.m. to 4:30 p.m. Monday - Friday. Please note that voicemails left after 4:00 p.m. may not be returned until the following business day.  We are closed weekends and major holidays. You have access to a nurse at all times for urgent questions. Please call the main number to the clinic [phone] and follow the prompts.  For any non-urgent questions, you may also contact your provider using MyChart. We now offer e-Visits for anyone 102 and older to request care online for non-urgent symptoms. For details visit [email or web address].   Also download the MyChart app! Go to the app store, search "MyChart", open the app, select [facility], and log in with your MyChart username and password.  Masks are optional in the cancer centers. If you would like for your care team to wear a mask while they are taking care of you, please let them know. For doctor visits, patients may have with them one support person who is at least 76 years old. At this time, visitors are not allowed in the infusion area.

## 2022-06-03 DIAGNOSIS — Z299 Encounter for prophylactic measures, unspecified: Secondary | ICD-10-CM | POA: Diagnosis not present

## 2022-06-03 DIAGNOSIS — Z6837 Body mass index (BMI) 37.0-37.9, adult: Secondary | ICD-10-CM | POA: Diagnosis not present

## 2022-06-03 DIAGNOSIS — Z713 Dietary counseling and surveillance: Secondary | ICD-10-CM | POA: Diagnosis not present

## 2022-06-03 DIAGNOSIS — E1122 Type 2 diabetes mellitus with diabetic chronic kidney disease: Secondary | ICD-10-CM | POA: Diagnosis not present

## 2022-06-03 DIAGNOSIS — I10 Essential (primary) hypertension: Secondary | ICD-10-CM | POA: Diagnosis not present

## 2022-06-04 ENCOUNTER — Inpatient Hospital Stay: Payer: Medicare PPO

## 2022-06-05 DIAGNOSIS — F419 Anxiety disorder, unspecified: Secondary | ICD-10-CM | POA: Diagnosis not present

## 2022-06-05 DIAGNOSIS — N189 Chronic kidney disease, unspecified: Secondary | ICD-10-CM | POA: Diagnosis not present

## 2022-06-05 DIAGNOSIS — E1122 Type 2 diabetes mellitus with diabetic chronic kidney disease: Secondary | ICD-10-CM | POA: Diagnosis not present

## 2022-06-05 DIAGNOSIS — I129 Hypertensive chronic kidney disease with stage 1 through stage 4 chronic kidney disease, or unspecified chronic kidney disease: Secondary | ICD-10-CM | POA: Diagnosis not present

## 2022-06-05 DIAGNOSIS — E1140 Type 2 diabetes mellitus with diabetic neuropathy, unspecified: Secondary | ICD-10-CM | POA: Diagnosis not present

## 2022-06-05 DIAGNOSIS — E7800 Pure hypercholesterolemia, unspecified: Secondary | ICD-10-CM | POA: Diagnosis not present

## 2022-06-05 DIAGNOSIS — E11628 Type 2 diabetes mellitus with other skin complications: Secondary | ICD-10-CM | POA: Diagnosis not present

## 2022-06-05 DIAGNOSIS — Z89512 Acquired absence of left leg below knee: Secondary | ICD-10-CM | POA: Diagnosis not present

## 2022-06-05 DIAGNOSIS — L03031 Cellulitis of right toe: Secondary | ICD-10-CM | POA: Diagnosis not present

## 2022-06-05 DIAGNOSIS — I959 Hypotension, unspecified: Secondary | ICD-10-CM | POA: Diagnosis not present

## 2022-06-05 DIAGNOSIS — M7989 Other specified soft tissue disorders: Secondary | ICD-10-CM | POA: Diagnosis not present

## 2022-06-06 DIAGNOSIS — L03116 Cellulitis of left lower limb: Secondary | ICD-10-CM | POA: Diagnosis not present

## 2022-06-06 DIAGNOSIS — E1122 Type 2 diabetes mellitus with diabetic chronic kidney disease: Secondary | ICD-10-CM | POA: Diagnosis not present

## 2022-06-06 DIAGNOSIS — Z89512 Acquired absence of left leg below knee: Secondary | ICD-10-CM | POA: Diagnosis not present

## 2022-06-06 DIAGNOSIS — Z8679 Personal history of other diseases of the circulatory system: Secondary | ICD-10-CM | POA: Diagnosis not present

## 2022-06-06 DIAGNOSIS — Z792 Long term (current) use of antibiotics: Secondary | ICD-10-CM | POA: Diagnosis not present

## 2022-06-06 DIAGNOSIS — N183 Chronic kidney disease, stage 3 unspecified: Secondary | ICD-10-CM | POA: Diagnosis not present

## 2022-06-06 DIAGNOSIS — E11621 Type 2 diabetes mellitus with foot ulcer: Secondary | ICD-10-CM | POA: Diagnosis not present

## 2022-06-06 DIAGNOSIS — D72829 Elevated white blood cell count, unspecified: Secondary | ICD-10-CM | POA: Diagnosis not present

## 2022-06-06 DIAGNOSIS — I129 Hypertensive chronic kidney disease with stage 1 through stage 4 chronic kidney disease, or unspecified chronic kidney disease: Secondary | ICD-10-CM | POA: Diagnosis not present

## 2022-06-06 DIAGNOSIS — E669 Obesity, unspecified: Secondary | ICD-10-CM | POA: Diagnosis not present

## 2022-06-07 DIAGNOSIS — I129 Hypertensive chronic kidney disease with stage 1 through stage 4 chronic kidney disease, or unspecified chronic kidney disease: Secondary | ICD-10-CM | POA: Diagnosis not present

## 2022-06-07 DIAGNOSIS — E1122 Type 2 diabetes mellitus with diabetic chronic kidney disease: Secondary | ICD-10-CM | POA: Diagnosis not present

## 2022-06-07 DIAGNOSIS — N183 Chronic kidney disease, stage 3 unspecified: Secondary | ICD-10-CM | POA: Diagnosis not present

## 2022-06-07 DIAGNOSIS — E669 Obesity, unspecified: Secondary | ICD-10-CM | POA: Diagnosis not present

## 2022-06-07 DIAGNOSIS — D72829 Elevated white blood cell count, unspecified: Secondary | ICD-10-CM | POA: Diagnosis not present

## 2022-06-07 DIAGNOSIS — I4891 Unspecified atrial fibrillation: Secondary | ICD-10-CM | POA: Diagnosis not present

## 2022-06-07 DIAGNOSIS — E876 Hypokalemia: Secondary | ICD-10-CM | POA: Diagnosis not present

## 2022-06-07 DIAGNOSIS — E785 Hyperlipidemia, unspecified: Secondary | ICD-10-CM | POA: Diagnosis not present

## 2022-06-07 DIAGNOSIS — L03115 Cellulitis of right lower limb: Secondary | ICD-10-CM | POA: Diagnosis not present

## 2022-06-08 DIAGNOSIS — E785 Hyperlipidemia, unspecified: Secondary | ICD-10-CM | POA: Diagnosis not present

## 2022-06-08 DIAGNOSIS — I129 Hypertensive chronic kidney disease with stage 1 through stage 4 chronic kidney disease, or unspecified chronic kidney disease: Secondary | ICD-10-CM | POA: Diagnosis not present

## 2022-06-08 DIAGNOSIS — N183 Chronic kidney disease, stage 3 unspecified: Secondary | ICD-10-CM | POA: Diagnosis not present

## 2022-06-08 DIAGNOSIS — I4891 Unspecified atrial fibrillation: Secondary | ICD-10-CM | POA: Diagnosis not present

## 2022-06-08 DIAGNOSIS — E1122 Type 2 diabetes mellitus with diabetic chronic kidney disease: Secondary | ICD-10-CM | POA: Diagnosis not present

## 2022-06-08 DIAGNOSIS — L03115 Cellulitis of right lower limb: Secondary | ICD-10-CM | POA: Diagnosis not present

## 2022-06-08 DIAGNOSIS — E11621 Type 2 diabetes mellitus with foot ulcer: Secondary | ICD-10-CM | POA: Diagnosis not present

## 2022-06-08 DIAGNOSIS — E669 Obesity, unspecified: Secondary | ICD-10-CM | POA: Diagnosis not present

## 2022-06-08 DIAGNOSIS — L97519 Non-pressure chronic ulcer of other part of right foot with unspecified severity: Secondary | ICD-10-CM | POA: Diagnosis not present

## 2022-06-09 DIAGNOSIS — N189 Chronic kidney disease, unspecified: Secondary | ICD-10-CM | POA: Diagnosis not present

## 2022-06-09 DIAGNOSIS — S81802A Unspecified open wound, left lower leg, initial encounter: Secondary | ICD-10-CM | POA: Diagnosis not present

## 2022-06-09 DIAGNOSIS — L97412 Non-pressure chronic ulcer of right heel and midfoot with fat layer exposed: Secondary | ICD-10-CM | POA: Diagnosis not present

## 2022-06-09 DIAGNOSIS — I4891 Unspecified atrial fibrillation: Secondary | ICD-10-CM | POA: Diagnosis not present

## 2022-06-09 DIAGNOSIS — E6609 Other obesity due to excess calories: Secondary | ICD-10-CM | POA: Diagnosis not present

## 2022-06-09 DIAGNOSIS — I129 Hypertensive chronic kidney disease with stage 1 through stage 4 chronic kidney disease, or unspecified chronic kidney disease: Secondary | ICD-10-CM | POA: Diagnosis not present

## 2022-06-09 DIAGNOSIS — I890 Lymphedema, not elsewhere classified: Secondary | ICD-10-CM | POA: Diagnosis not present

## 2022-06-09 DIAGNOSIS — L03115 Cellulitis of right lower limb: Secondary | ICD-10-CM | POA: Diagnosis not present

## 2022-06-09 DIAGNOSIS — S81801A Unspecified open wound, right lower leg, initial encounter: Secondary | ICD-10-CM | POA: Diagnosis not present

## 2022-06-09 DIAGNOSIS — L97519 Non-pressure chronic ulcer of other part of right foot with unspecified severity: Secondary | ICD-10-CM | POA: Diagnosis not present

## 2022-06-09 DIAGNOSIS — E1122 Type 2 diabetes mellitus with diabetic chronic kidney disease: Secondary | ICD-10-CM | POA: Diagnosis not present

## 2022-06-09 DIAGNOSIS — E785 Hyperlipidemia, unspecified: Secondary | ICD-10-CM | POA: Diagnosis not present

## 2022-06-09 DIAGNOSIS — E11621 Type 2 diabetes mellitus with foot ulcer: Secondary | ICD-10-CM | POA: Diagnosis not present

## 2022-06-13 ENCOUNTER — Inpatient Hospital Stay: Payer: Medicare PPO

## 2022-06-13 VITALS — BP 121/74 | HR 52 | Temp 98.6°F | Resp 18

## 2022-06-13 DIAGNOSIS — D472 Monoclonal gammopathy: Secondary | ICD-10-CM | POA: Diagnosis not present

## 2022-06-13 DIAGNOSIS — D509 Iron deficiency anemia, unspecified: Secondary | ICD-10-CM

## 2022-06-13 DIAGNOSIS — C61 Malignant neoplasm of prostate: Secondary | ICD-10-CM | POA: Diagnosis not present

## 2022-06-13 DIAGNOSIS — N1832 Chronic kidney disease, stage 3b: Secondary | ICD-10-CM | POA: Diagnosis not present

## 2022-06-13 MED ORDER — SODIUM CHLORIDE 0.9 % IV SOLN
400.0000 mg | Freq: Once | INTRAVENOUS | Status: AC
  Administered 2022-06-13: 400 mg via INTRAVENOUS
  Filled 2022-06-13: qty 20

## 2022-06-13 MED ORDER — SODIUM CHLORIDE 0.9 % IV SOLN: Freq: Once | INTRAVENOUS | Status: AC

## 2022-06-13 MED ORDER — ACETAMINOPHEN 325 MG PO TABS
650.0000 mg | ORAL_TABLET | Freq: Once | ORAL | Status: AC
  Administered 2022-06-13: 650 mg via ORAL
  Filled 2022-06-13: qty 2

## 2022-06-13 MED ORDER — LORATADINE 10 MG PO TABS
10.0000 mg | ORAL_TABLET | Freq: Once | ORAL | Status: AC
  Administered 2022-06-13: 10 mg via ORAL
  Filled 2022-06-13: qty 1

## 2022-06-13 NOTE — Progress Notes (Unsigned)
Iron infusion given per orders. Patient tolerated it well without problems. Vitals stable and discharged home from clinic via wheelchair. Follow up as scheduled.  

## 2022-06-13 NOTE — Patient Instructions (Signed)
[facility]  Discharge Instructions: Thank you for choosing [facility] to provide your oncology and hematology care.  If you have a lab appointment with the [facility], please come in thru the Main Entrance and check in at the main information desk.  Wear comfortable clothing and clothing appropriate for easy access to any Portacath or PICC line.   We strive to give you quality time with your provider. You may need to reschedule your appointment if you arrive late (15 or more minutes).  Arriving late affects you and other patients whose appointments are after yours.  Also, if you miss three or more appointments without notifying the office, you may be dismissed from the clinic at the provider's discretion.      For prescription refill requests, have your pharmacy contact our office and allow 72 hours for refills to be completed.    Today you received venofer '300mg'$ .     To help prevent nausea and vomiting after your treatment, we encourage you to take your nausea medication as directed.  BELOW ARE SYMPTOMS THAT SHOULD BE REPORTED IMMEDIATELY: *FEVER GREATER THAN 100.4 F (38 C) OR HIGHER *CHILLS OR SWEATING *NAUSEA AND VOMITING THAT IS NOT CONTROLLED WITH YOUR NAUSEA MEDICATION *UNUSUAL SHORTNESS OF BREATH *UNUSUAL BRUISING OR BLEEDING *URINARY PROBLEMS (pain or burning when urinating, or frequent urination) *BOWEL PROBLEMS (unusual diarrhea, constipation, pain near the anus) TENDERNESS IN MOUTH AND THROAT WITH OR WITHOUT PRESENCE OF ULCERS (sore throat, sores in mouth, or a toothache) UNUSUAL RASH, SWELLING OR PAIN  UNUSUAL VAGINAL DISCHARGE OR ITCHING   Items with * indicate a potential emergency and should be followed up as soon as possible or go to the Emergency Department if any problems should occur.  Please show the CHEMOTHERAPY ALERT CARD or IMMUNOTHERAPY ALERT CARD at check-in to the Emergency Department and triage nurse.  Should you have  questions after your visit or need to cancel or reschedule your appointment, please contact [facility] [phone]  and follow the prompts.  Office hours are 8:00 a.m. to 4:30 p.m. Monday - Friday. Please note that voicemails left after 4:00 p.m. may not be returned until the following business day.  We are closed weekends and major holidays. You have access to a nurse at all times for urgent questions. Please call the main number to the clinic [phone] and follow the prompts.  For any non-urgent questions, you may also contact your provider using MyChart. We now offer e-Visits for anyone 28 and older to request care online for non-urgent symptoms. For details visit [email or web address].   Also download the MyChart app! Go to the app store, search "MyChart", open the app, select [facility], and log in with your MyChart username and password.  Masks are optional in the cancer centers. If you would like for your care team to wear a mask while they are taking care of you, please let them know. You may have one support person who is at least 76 years old accompany you for your appointments.

## 2022-06-16 ENCOUNTER — Ambulatory Visit (INDEPENDENT_AMBULATORY_CARE_PROVIDER_SITE_OTHER): Payer: Medicare PPO | Admitting: Urology

## 2022-06-16 VITALS — BP 114/72 | HR 63

## 2022-06-16 DIAGNOSIS — C61 Malignant neoplasm of prostate: Secondary | ICD-10-CM

## 2022-06-16 DIAGNOSIS — N1832 Chronic kidney disease, stage 3b: Secondary | ICD-10-CM | POA: Diagnosis not present

## 2022-06-16 DIAGNOSIS — Z978 Presence of other specified devices: Secondary | ICD-10-CM

## 2022-06-16 DIAGNOSIS — R339 Retention of urine, unspecified: Secondary | ICD-10-CM | POA: Diagnosis not present

## 2022-06-16 DIAGNOSIS — D472 Monoclonal gammopathy: Secondary | ICD-10-CM | POA: Diagnosis not present

## 2022-06-16 NOTE — Progress Notes (Unsigned)
Cath Change/ Replacement  Patient is present today for a catheter change due to urinary retention.  66m of water was removed from the balloon, a 16FR foley cath was removed with out difficulty.  Patient was cleaned and prepped in a sterile fashion with betadine. A 16 FR foley cath was replaced into the bladder no complications were noted Urine return was noted 120mand urine was yellow in color. The balloon was filled with 1041mf sterile water. A leg bag was attached for drainage. Patient was given proper instruction on catheter care.    Performed by: KouCorinna CapraMA  Follow up: Follow up as scheduled.

## 2022-06-16 NOTE — Patient Instructions (Signed)

## 2022-06-16 NOTE — Progress Notes (Unsigned)
06/16/2022 9:42 AM   [name] [name] 09, 1947 [id number]  Referring provider: Monico Blitz, [place] [place],  [place] [address]  Urinary retention   HPI: [name] is a 76yo here for evaluation of urinary retention. For the past 6-7 months he has been managed with a foley catheter. He was previously followed with [facility]. His last foley change was 7/26. No issues with the foley catheter. He denies any gross hematuria. He had UDS at [facility] which showed a hyporeflexic bladder and had over a liter instilled during the test.  He has diabetic neuropathy and has numbness in his fingers and has poor manual dexterity.    PMH: Past Medical History:  Diagnosis Date   Anemia    Anxiety    Cancer ([facility])    Prostate- Stage I   Critical lower limb ischemia (HCC)    Essential hypertension    Gangrene of foot [facility]) March 2016   Left foot   Hyperlipidemia    Neuropathy    Type 2 diabetes mellitus ([facility])     Surgical History: Past Surgical History:  Procedure Laterality Date   AMPUTATION Left 01/12/2015   Procedure: AMPUTATION BELOW KNEE;  Surgeon: Leandrew Koyanagi, MD;  Location: [facility];  Service: Orthopedics;  Laterality: Left;   APPLICATION OF WOUND VAC Right 01/12/2015   Procedure: APPLICATION OF WOUND VAC;  Surgeon: Leandrew Koyanagi, MD;  Location: [facility];  Service: Orthopedics;  Laterality: Right;   CARDIAC ELECTROPHYSIOLOGY STUDY AND ABLATION     CHOLECYSTECTOMY     ORIF HUMERUS FRACTURE      Home Medications:  Allergies as of 06/16/2022   No Known Allergies      Medication List        Accurate as of June 16, 2022  9:42 AM. If you have any questions, ask your nurse or doctor.          Accu-Chek Guide Me w/Device Kit   acetaminophen-codeine 300-30 MG tablet Commonly known as: TYLENOL #3 Take by mouth.   alprazolam 2 MG tablet Commonly known as: XANAX Take by mouth.   ALPRAZolam 1 MG tablet Commonly known as: XANAX Take by mouth.   ascorbic acid  1000 MG tablet Commonly known as: VITAMIN C Take 1,000 mg by mouth daily in the afternoon.   aspirin EC 81 MG tablet Take 1 tablet by mouth daily.   cadexomer iodine 0.9 % gel Commonly known as: IODOSORB Apply 1 application  topically daily as needed for wound care.   CALCIUM 600 PO Take 600 mg by mouth daily in the afternoon.   cefdinir 300 MG capsule Commonly known as: OMNICEF Take 300 mg by mouth every 12 (twelve) hours.   chlorthalidone 25 MG tablet Commonly known as: HYGROTON Take 1 tablet by mouth daily.   DSS 100 MG Caps Take by mouth.   empagliflozin 10 MG Tabs tablet Commonly known as: JARDIANCE Take 1 tablet by mouth daily.   finasteride 5 MG tablet Commonly known as: PROSCAR Take 5 mg by mouth daily in the afternoon.   Fish Oil 1200 MG Caps Take 1,200 mg by mouth daily in the afternoon.   folic acid 893 MCG tablet Commonly known as: FOLVITE Take by mouth.   furosemide 40 MG tablet Commonly known as: LASIX Take 40 mg by mouth 2 (two) times daily.   glimepiride 4 MG tablet Commonly known as: AMARYL Take 4 mg by mouth daily in the afternoon.   Global Alcohol Prep  Ease 70 % Pads SMARTSIG:1 swab Topical 3 Times Daily   Global Alcohol Prep Ease 70 % Pads Apply topically.   hydrALAZINE 50 MG tablet Commonly known as: APRESOLINE Take 50 mg by mouth 2 (two) times daily.   HYDROcodone-acetaminophen 5-325 MG tablet Commonly known as: NORCO/VICODIN Take 1 tablet by mouth 2 (two) times daily as needed.   Levemir FlexPen 100 UNIT/ML FlexPen Generic drug: insulin detemir SMARTSIG:20 Unit(s) SUB-Q Twice Daily   Levemir FlexPen 100 UNIT/ML FlexPen Generic drug: insulin detemir Inject into the skin.   losartan 50 MG tablet Commonly known as: COZAAR Take 50 mg by mouth daily.   metFORMIN 500 MG tablet Commonly known as: GLUCOPHAGE Take 1 tablet by mouth daily with breakfast.   metoprolol tartrate 100 MG tablet Commonly known as: LOPRESSOR Take  100 mg by mouth 2 (two) times daily.   multivitamin with minerals tablet Take 1 tablet by mouth daily in the afternoon.   OneTouch Verio test strip Generic drug: glucose blood 1 each daily.   potassium chloride SA 20 MEQ tablet Commonly known as: KLOR-CON M TAKE 1 TABLET BY MOUTH EVERY DAY, DO not crush OR chew   pregabalin 50 MG capsule Commonly known as: LYRICA Take by mouth.   sildenafil 20 MG tablet Commonly known as: REVATIO Take by mouth.   simvastatin 40 MG tablet Commonly known as: ZOCOR Take 40 mg by mouth daily in the afternoon.   tamsulosin 0.4 MG Caps capsule Commonly known as: FLOMAX Take 0.4 mg by mouth daily in the afternoon.   trandolapril 4 MG tablet Commonly known as: MAVIK Take by mouth.   traZODone 50 MG tablet Commonly known as: DESYREL Take by mouth.   Vitamin D3 10 MCG (400 UNIT) tablet Take by mouth.   vitamin E 180 MG (400 UNITS) capsule Take 400 Units by mouth daily.        Allergies: No Known Allergies  Family History: Family History  Problem Relation Age of Onset   Hypertension Father        and kidney failure   Heart disease Father    Heart attack Father    Kidney disease Father    Alzheimer's disease Mother    Heart attack Maternal Grandfather    Heart attack Paternal Grandfather    Colon cancer Paternal Aunt 72       s/p surgical rsxn; lived to 76 yrs old   Hypertension Sister    Hypercholesterolemia Sister    Cervical cancer Sister    Gastric cancer Neg Hx    Esophageal cancer Neg Hx     Social History:  reports that he has never smoked. He has never used smokeless tobacco. He reports current alcohol use. He reports that he does not use drugs.  ROS: All other review of systems were reviewed and are negative except what is noted above in HPI  Physical Exam: BP 114/72   Pulse 63   Constitutional:  Alert and oriented, No acute distress. HEENT: [place] AT, moist mucus membranes.  Trachea midline, no  masses. Cardiovascular: No clubbing, cyanosis, or edema. Respiratory: Normal respiratory effort, no increased work of breathing. GI: Abdomen is soft, nontender, nondistended, no abdominal masses GU: No CVA tenderness.  Lymph: No cervical or inguinal lymphadenopathy. Skin: No rashes, bruises or suspicious lesions. Neurologic: Grossly intact, no focal deficits, moving all 4 extremities. Psychiatric: Normal mood and affect.  Laboratory Data: Lab Results  Component Value Date   WBC 11.4 (H) 05/01/2022   HGB 13.5   05/01/2022   HCT 40.8 05/01/2022   MCV 90.1 05/01/2022   PLT 246 05/01/2022    Lab Results  Component Value Date   CREATININE 2.52 (H) 05/01/2022    No results found for: "PSA"  No results found for: "TESTOSTERONE"  Lab Results  Component Value Date   HGBA1C 8.2 (H) 01/11/2015    Urinalysis No results found for: "COLORURINE", "APPEARANCEUR", "LABSPEC", "PHURINE", "GLUCOSEU", "HGBUR", "BILIRUBINUR", "KETONESUR", "PROTEINUR", "UROBILINOGEN", "NITRITE", "LEUKOCYTESUR"  No results found for: "LABMICR", "WBCUA", "RBCUA", "LABEPIT", "MUCUS", "BACTERIA"  Pertinent Imaging:  No results found for this or any previous visit.  No results found for this or any previous visit.  No results found for this or any previous visit.  No results found for this or any previous visit.  Results for orders placed during the hospital encounter of 12/06/19  US RENAL  Narrative CLINICAL DATA:  Chronic renal disease.  Diabetes mellitus.  EXAM: RENAL / URINARY TRACT ULTRASOUND COMPLETE  COMPARISON:  None.  FINDINGS: Right Kidney:  Renal measurements: 11.1 x 6.5 x 7.5 cm = volume: 256 mL . Echogenicity and renal cortical thickness are within normal limits. No perinephric fluid or hydronephrosis visualized. There are cysts in the right kidney, largest arising in the upper pole region measuring 7.4 x 4.5 x 3.0 cm. A cyst in the midportion of the kidney measures 4.1 x 3.0 x 3.0  cm. A lower pole cyst measures 3.2 x 3.0 x 3.0 cm. No sonographically demonstrable calculus or ureterectasis.  Left Kidney:  Renal measurements: 11.2 x 6.5 x 6.4 cm = volume: 265 mL. Echogenicity and renal cortical thickness are within normal limits. No perinephric fluid or hydronephrosis visualized. There is a cyst in the lateral mid to lower pole kidney region measuring 6.7 x 6.1 x 6.9 cm. A cyst in the mid left kidney measures 2.8 x 1.7 x 1.7 cm. There is an apparent 6 mm calculus in the mid left kidney. No ureterectasis.  Bladder:  Appears normal for degree of bladder distention.  Other:  None.  IMPRESSION: Renal cysts bilaterally. No obstructing focus in either kidney. Renal cortical thickness and renal echogenicity normal bilaterally.   Electronically Signed By: Lowella Grip III M.D. On: 12/07/2019 08:32  No results found for this or any previous visit.  No results found for this or any previous visit.  No results found for this or any previous visit.   Assessment & Plan:    1. Incomplete bladder emptying -Continue indwelling foley. The patient has very poor contractility and is not a candidate for surgical interventional.   2. Foley catheter in place -continue monthly foley changed   No follow-ups on file.  Nicolette Bang, MD  [facility] [place]

## 2022-06-17 DIAGNOSIS — I10 Essential (primary) hypertension: Secondary | ICD-10-CM | POA: Diagnosis not present

## 2022-06-17 DIAGNOSIS — L97509 Non-pressure chronic ulcer of other part of unspecified foot with unspecified severity: Secondary | ICD-10-CM | POA: Diagnosis not present

## 2022-06-17 DIAGNOSIS — Z299 Encounter for prophylactic measures, unspecified: Secondary | ICD-10-CM | POA: Diagnosis not present

## 2022-06-17 DIAGNOSIS — Z6837 Body mass index (BMI) 37.0-37.9, adult: Secondary | ICD-10-CM | POA: Diagnosis not present

## 2022-06-17 DIAGNOSIS — I4892 Unspecified atrial flutter: Secondary | ICD-10-CM | POA: Diagnosis not present

## 2022-06-17 DIAGNOSIS — E11621 Type 2 diabetes mellitus with foot ulcer: Secondary | ICD-10-CM | POA: Diagnosis not present

## 2022-06-19 ENCOUNTER — Encounter: Payer: Self-pay | Admitting: Urology

## 2022-06-19 DIAGNOSIS — I10 Essential (primary) hypertension: Secondary | ICD-10-CM | POA: Diagnosis not present

## 2022-06-19 DIAGNOSIS — N184 Chronic kidney disease, stage 4 (severe): Secondary | ICD-10-CM | POA: Diagnosis not present

## 2022-06-27 DIAGNOSIS — E1122 Type 2 diabetes mellitus with diabetic chronic kidney disease: Secondary | ICD-10-CM | POA: Diagnosis not present

## 2022-06-27 DIAGNOSIS — S91111D Laceration without foreign body of right great toe without damage to nail, subsequent encounter: Secondary | ICD-10-CM | POA: Diagnosis not present

## 2022-06-27 DIAGNOSIS — I10 Essential (primary) hypertension: Secondary | ICD-10-CM | POA: Diagnosis not present

## 2022-06-27 DIAGNOSIS — N183 Chronic kidney disease, stage 3 unspecified: Secondary | ICD-10-CM | POA: Diagnosis not present

## 2022-07-01 DIAGNOSIS — Z79899 Other long term (current) drug therapy: Secondary | ICD-10-CM | POA: Diagnosis not present

## 2022-07-01 DIAGNOSIS — L089 Local infection of the skin and subcutaneous tissue, unspecified: Secondary | ICD-10-CM | POA: Diagnosis not present

## 2022-07-01 DIAGNOSIS — E7800 Pure hypercholesterolemia, unspecified: Secondary | ICD-10-CM | POA: Diagnosis not present

## 2022-07-01 DIAGNOSIS — L97511 Non-pressure chronic ulcer of other part of right foot limited to breakdown of skin: Secondary | ICD-10-CM | POA: Diagnosis not present

## 2022-07-01 DIAGNOSIS — E11628 Type 2 diabetes mellitus with other skin complications: Secondary | ICD-10-CM | POA: Diagnosis not present

## 2022-07-01 DIAGNOSIS — I129 Hypertensive chronic kidney disease with stage 1 through stage 4 chronic kidney disease, or unspecified chronic kidney disease: Secondary | ICD-10-CM | POA: Diagnosis not present

## 2022-07-01 DIAGNOSIS — N189 Chronic kidney disease, unspecified: Secondary | ICD-10-CM | POA: Diagnosis not present

## 2022-07-01 DIAGNOSIS — Z9049 Acquired absence of other specified parts of digestive tract: Secondary | ICD-10-CM | POA: Diagnosis not present

## 2022-07-01 DIAGNOSIS — N400 Enlarged prostate without lower urinary tract symptoms: Secondary | ICD-10-CM | POA: Diagnosis not present

## 2022-07-03 ENCOUNTER — Ambulatory Visit (INDEPENDENT_AMBULATORY_CARE_PROVIDER_SITE_OTHER): Payer: Medicare PPO | Admitting: Physician Assistant

## 2022-07-03 ENCOUNTER — Telehealth: Payer: Self-pay

## 2022-07-03 DIAGNOSIS — R339 Retention of urine, unspecified: Secondary | ICD-10-CM

## 2022-07-03 NOTE — Telephone Encounter (Signed)
Ok. Do you need me to call and schedule him?

## 2022-07-03 NOTE — Telephone Encounter (Signed)
Patient called advising that his catheter came out and due to transportation's reasons he can only come in this afternoon.

## 2022-07-03 NOTE — Progress Notes (Signed)
Patient states that his catheter bag got caught and pull foley catheter out with balloon intact.   Cath Change/ Replacement  Patient is present today for a catheter change due to urinary retention.  71m of water was removed from the balloon, a 16FR foley cath was removed without difficulty.  Patient was cleaned and prepped in a sterile fashion with betadine and 2% lidocaine jelly was instilled into the urethra. A 16 FR foley cath was replaced into the bladder, no complications were noted. Urine return was noted 53110mand urine was yellow in color. The balloon was filled with 1024mf sterile water. A leg bag was attached for drainage.  A night bag was also given to the patient and patient was given instruction on how to change from one bag to another. Patient was given proper instruction on catheter care.    Performed by: Fraya Ueda LPN   Follow up: keep scheduled [place]

## 2022-07-14 DIAGNOSIS — I509 Heart failure, unspecified: Secondary | ICD-10-CM | POA: Diagnosis not present

## 2022-07-14 DIAGNOSIS — Z8546 Personal history of malignant neoplasm of prostate: Secondary | ICD-10-CM | POA: Diagnosis not present

## 2022-07-14 DIAGNOSIS — J9601 Acute respiratory failure with hypoxia: Secondary | ICD-10-CM | POA: Diagnosis not present

## 2022-07-14 DIAGNOSIS — I5031 Acute diastolic (congestive) heart failure: Secondary | ICD-10-CM | POA: Diagnosis not present

## 2022-07-14 DIAGNOSIS — E662 Morbid (severe) obesity with alveolar hypoventilation: Secondary | ICD-10-CM | POA: Diagnosis not present

## 2022-07-14 DIAGNOSIS — E1142 Type 2 diabetes mellitus with diabetic polyneuropathy: Secondary | ICD-10-CM | POA: Diagnosis not present

## 2022-07-14 DIAGNOSIS — Z9989 Dependence on other enabling machines and devices: Secondary | ICD-10-CM | POA: Diagnosis not present

## 2022-07-14 DIAGNOSIS — R0902 Hypoxemia: Secondary | ICD-10-CM | POA: Diagnosis not present

## 2022-07-14 DIAGNOSIS — J9621 Acute and chronic respiratory failure with hypoxia: Secondary | ICD-10-CM | POA: Diagnosis not present

## 2022-07-14 DIAGNOSIS — E1140 Type 2 diabetes mellitus with diabetic neuropathy, unspecified: Secondary | ICD-10-CM | POA: Diagnosis not present

## 2022-07-14 DIAGNOSIS — I4819 Other persistent atrial fibrillation: Secondary | ICD-10-CM | POA: Diagnosis not present

## 2022-07-14 DIAGNOSIS — Z7901 Long term (current) use of anticoagulants: Secondary | ICD-10-CM | POA: Diagnosis not present

## 2022-07-14 DIAGNOSIS — I5043 Acute on chronic combined systolic (congestive) and diastolic (congestive) heart failure: Secondary | ICD-10-CM | POA: Diagnosis not present

## 2022-07-14 DIAGNOSIS — I4891 Unspecified atrial fibrillation: Secondary | ICD-10-CM | POA: Diagnosis not present

## 2022-07-14 DIAGNOSIS — Z9981 Dependence on supplemental oxygen: Secondary | ICD-10-CM | POA: Diagnosis not present

## 2022-07-14 DIAGNOSIS — Z20822 Contact with and (suspected) exposure to covid-19: Secondary | ICD-10-CM | POA: Diagnosis not present

## 2022-07-14 DIAGNOSIS — T83511D Infection and inflammatory reaction due to indwelling urethral catheter, subsequent encounter: Secondary | ICD-10-CM | POA: Diagnosis not present

## 2022-07-14 DIAGNOSIS — E785 Hyperlipidemia, unspecified: Secondary | ICD-10-CM | POA: Diagnosis not present

## 2022-07-14 DIAGNOSIS — E1122 Type 2 diabetes mellitus with diabetic chronic kidney disease: Secondary | ICD-10-CM | POA: Diagnosis not present

## 2022-07-14 DIAGNOSIS — N1832 Chronic kidney disease, stage 3b: Secondary | ICD-10-CM | POA: Diagnosis not present

## 2022-07-14 DIAGNOSIS — J9611 Chronic respiratory failure with hypoxia: Secondary | ICD-10-CM | POA: Diagnosis not present

## 2022-07-14 DIAGNOSIS — E1121 Type 2 diabetes mellitus with diabetic nephropathy: Secondary | ICD-10-CM | POA: Diagnosis not present

## 2022-07-14 DIAGNOSIS — I130 Hypertensive heart and chronic kidney disease with heart failure and stage 1 through stage 4 chronic kidney disease, or unspecified chronic kidney disease: Secondary | ICD-10-CM | POA: Diagnosis not present

## 2022-07-14 DIAGNOSIS — R0602 Shortness of breath: Secondary | ICD-10-CM | POA: Diagnosis not present

## 2022-07-14 DIAGNOSIS — Z79899 Other long term (current) drug therapy: Secondary | ICD-10-CM | POA: Diagnosis not present

## 2022-07-14 DIAGNOSIS — Z89512 Acquired absence of left leg below knee: Secondary | ICD-10-CM | POA: Diagnosis not present

## 2022-07-14 DIAGNOSIS — I5033 Acute on chronic diastolic (congestive) heart failure: Secondary | ICD-10-CM | POA: Diagnosis not present

## 2022-07-14 DIAGNOSIS — N183 Chronic kidney disease, stage 3 unspecified: Secondary | ICD-10-CM | POA: Diagnosis not present

## 2022-07-22 ENCOUNTER — Ambulatory Visit: Payer: Medicare PPO

## 2022-07-25 DIAGNOSIS — R0602 Shortness of breath: Secondary | ICD-10-CM | POA: Diagnosis not present

## 2022-07-30 ENCOUNTER — Ambulatory Visit (INDEPENDENT_AMBULATORY_CARE_PROVIDER_SITE_OTHER): Payer: Medicare PPO | Admitting: Pulmonary Disease

## 2022-07-30 ENCOUNTER — Encounter: Payer: Self-pay | Admitting: Pulmonary Disease

## 2022-07-30 VITALS — BP 120/70 | HR 68 | Ht 67.0 in | Wt 236.0 lb

## 2022-07-30 DIAGNOSIS — Z299 Encounter for prophylactic measures, unspecified: Secondary | ICD-10-CM | POA: Diagnosis not present

## 2022-07-30 DIAGNOSIS — N183 Chronic kidney disease, stage 3 unspecified: Secondary | ICD-10-CM | POA: Diagnosis not present

## 2022-07-30 DIAGNOSIS — R0683 Snoring: Secondary | ICD-10-CM | POA: Diagnosis not present

## 2022-07-30 DIAGNOSIS — I10 Essential (primary) hypertension: Secondary | ICD-10-CM | POA: Diagnosis not present

## 2022-07-30 DIAGNOSIS — I4891 Unspecified atrial fibrillation: Secondary | ICD-10-CM | POA: Diagnosis not present

## 2022-07-30 DIAGNOSIS — Z6837 Body mass index (BMI) 37.0-37.9, adult: Secondary | ICD-10-CM | POA: Diagnosis not present

## 2022-07-30 DIAGNOSIS — J9611 Chronic respiratory failure with hypoxia: Secondary | ICD-10-CM | POA: Diagnosis not present

## 2022-07-30 NOTE — Progress Notes (Signed)
[name] [name]    [id number]    06-10-1946  Primary Care Physician:Shah, Weldon Picking, MD  Referring Physician: Monico Blitz, [place],  [place] [address]  Chief complaint:   Evaluation for sleep apnea  HPI:  Was recently hospitalized Did use BiPAP therapy while in the hospital Was well-tolerated  He did have a sleep study done about 10 years ago and he had mild sleep apnea -He remembers an AHI of 13, -Was not initiated on any treatment at the time  Was recently hospitalized for heart failure  Admits to snoring, possible apneas while in the hospital Occasional dryness of his mouth in the mornings Usually goes to bed about 3 AM Wake up time is between 11 and 12 Denies any morning headaches Denies any chest discomfort Denies any choking episodes  Breathing is steady  Never smoker  Worked as an Optometrist  No pets   Outpatient Encounter Medications as of 07/30/2022  Medication Sig   Alcohol Swabs (GLOBAL ALCOHOL PREP EASE) 70 % PADS SMARTSIG:1 swab Topical 3 Times Daily   Alcohol Swabs (GLOBAL ALCOHOL PREP EASE) 70 % PADS Apply topically.   alprazolam (XANAX) 2 MG tablet Take by mouth.   ascorbic acid (VITAMIN C) 1000 MG tablet Take 1,000 mg by mouth daily in the afternoon.    aspirin EC 81 MG tablet Take 1 tablet by mouth daily.   Blood Glucose Monitoring Suppl (ACCU-CHEK GUIDE ME) w/Device KIT    cadexomer iodine (IODOSORB) 0.9 % gel Apply 1 application  topically daily as needed for wound care.   Calcium Carbonate (CALCIUM 600 PO) Take 600 mg by mouth daily in the afternoon.   chlorthalidone (HYGROTON) 25 MG tablet Take 1 tablet by mouth daily.   Cholecalciferol (VITAMIN D3) 10 MCG (400 UNIT) tablet Take by mouth.   Docusate Sodium (DSS) 100 MG CAPS Take by mouth.   empagliflozin (JARDIANCE) 10 MG TABS tablet Take 1 tablet by mouth daily.   finasteride (PROSCAR) 5 MG tablet Take 5 mg by mouth daily in the afternoon.    folic acid (FOLVITE) 263 MCG  tablet Take by mouth.   furosemide (LASIX) 40 MG tablet Take 40 mg by mouth 2 (two) times daily.   glimepiride (AMARYL) 4 MG tablet Take 4 mg by mouth daily in the afternoon.    hydrALAZINE (APRESOLINE) 50 MG tablet Take 50 mg by mouth 2 (two) times daily.   insulin detemir (LEVEMIR FLEXPEN) 100 UNIT/ML FlexPen Inject into the skin.   LEVEMIR FLEXPEN 100 UNIT/ML FlexPen SMARTSIG:20 Unit(s) SUB-Q Twice Daily   metFORMIN (GLUCOPHAGE) 500 MG tablet Take 1 tablet by mouth daily with breakfast.   Multiple Vitamins-Minerals (MULTIVITAMIN WITH MINERALS) tablet Take 1 tablet by mouth daily in the afternoon.    Omega-3 Fatty Acids (FISH OIL) 1200 MG CAPS Take 1,200 mg by mouth daily in the afternoon.   ONETOUCH VERIO test strip 1 each daily.   potassium chloride SA (KLOR-CON M) 20 MEQ tablet TAKE 1 TABLET BY MOUTH EVERY DAY, DO not crush OR chew   pregabalin (LYRICA) 50 MG capsule Take by mouth.   sildenafil (REVATIO) 20 MG tablet Take by mouth.   simvastatin (ZOCOR) 40 MG tablet Take 40 mg by mouth daily in the afternoon.    tamsulosin (FLOMAX) 0.4 MG CAPS capsule Take 0.4 mg by mouth daily in the afternoon.    trandolapril (MAVIK) 4 MG tablet Take by mouth.   traZODone (DESYREL) 50 MG tablet  Take by mouth.   vitamin E 400 UNIT capsule Take 400 Units by mouth daily.   [DISCONTINUED] cefdinir (OMNICEF) 300 MG capsule Take 300 mg by mouth every 12 (twelve) hours.   [DISCONTINUED] metoprolol (LOPRESSOR) 100 MG tablet Take 100 mg by mouth 2 (two) times daily.   metoprolol succinate (TOPROL-XL) 50 MG 24 hr tablet Take 50 mg by mouth daily.   [DISCONTINUED] acetaminophen-codeine (TYLENOL #3) 300-30 MG tablet Take by mouth. (Patient not taking: Reported on 06/16/2022)   [DISCONTINUED] ALPRAZolam (XANAX) 1 MG tablet Take by mouth. (Patient not taking: Reported on 06/16/2022)   [DISCONTINUED] HYDROcodone-acetaminophen (NORCO/VICODIN) 5-325 MG tablet Take 1 tablet by mouth 2 (two) times daily as needed. (Patient  not taking: Reported on 07/30/2022)   [DISCONTINUED] losartan (COZAAR) 50 MG tablet Take 50 mg by mouth daily. (Patient not taking: Reported on 07/30/2022)   No facility-administered encounter medications on file as of 07/30/2022.    Allergies as of 07/30/2022   (No Known Allergies)    Past Medical History:  Diagnosis Date   Anemia    Anxiety    Cancer ([place])    Prostate- Stage I   Critical lower limb ischemia [facility])    Essential hypertension    Gangrene of foot [facility]) March 2016   Left foot   Hyperlipidemia    Neuropathy    Type 2 diabetes mellitus ([place])     Past Surgical History:  Procedure Laterality Date   AMPUTATION Left 01/12/2015   Procedure: AMPUTATION BELOW KNEE;  Surgeon: Leandrew Koyanagi, MD;  Location: [place];  Service: Orthopedics;  Laterality: Left;   APPLICATION OF WOUND VAC Right 01/12/2015   Procedure: APPLICATION OF WOUND VAC;  Surgeon: Leandrew Koyanagi, MD;  Location: [place];  Service: Orthopedics;  Laterality: Right;   CARDIAC ELECTROPHYSIOLOGY STUDY AND ABLATION     CHOLECYSTECTOMY     ORIF HUMERUS FRACTURE      Family History  Problem Relation Age of Onset   Hypertension Father        and kidney failure   Heart disease Father    Heart attack Father    Kidney disease Father    Alzheimer's disease Mother    Heart attack Maternal Grandfather    Heart attack Paternal Grandfather    Colon cancer Paternal Aunt 44       s/p surgical rsxn; lived to 76 yrs old   Hypertension Sister    Hypercholesterolemia Sister    Cervical cancer Sister    Gastric cancer Neg Hx    Esophageal cancer Neg Hx     Social History   Socioeconomic History   Marital status: Single    Spouse name: Not on file   Number of children: 0   Years of education: Not on file   Highest education level: Not on file  Occupational History   Occupation: RETIRED  Tobacco Use   Smoking status: Never   Smokeless tobacco: Never  Vaping Use   Vaping Use: Never used  Substance and Sexual  Activity   Alcohol use: Yes    Alcohol/week: 0.0 standard drinks of alcohol    Comment: rare   Drug use: No   Sexual activity: Not Currently  Other Topics Concern   Not on file  Social History Narrative   Not on file   Social Determinants of Health   Financial Resource Strain: Medium Risk (03/13/2020)   Overall Financial Resource Strain (CARDIA)    Difficulty of Paying Living Expenses: Somewhat hard  Food Insecurity: Food Insecurity Present (03/13/2020)   Hunger Vital Sign    Worried About Running Out of Food in the Last Year: Sometimes true    Ran Out of Food in the Last Year: Sometimes true  Transportation Needs: Unmet Transportation Needs (03/13/2020)   PRAPARE - Transportation    Lack of Transportation (Medical): Yes    Lack of Transportation (Non-Medical): Yes  Physical Activity: Inactive (03/13/2020)   Exercise Vital Sign    Days of Exercise per Week: 0 days    Minutes of Exercise per Session: 0 min  Stress: No Stress Concern Present (03/13/2020)   [place]    Feeling of Stress : Not at all  Social Connections: Socially Isolated (03/13/2020)   Social Connection and Isolation Panel [NHANES]    Frequency of Communication with Friends and Family: More than three times a week    Frequency of Social Gatherings with Friends and Family: Twice a week    Attends Religious Services: Never    Marine scientist or Organizations: No    Attends Archivist Meetings: Never    Marital Status: Never married  Intimate Partner Violence: Not At Risk (03/13/2020)   Humiliation, Afraid, Rape, and Kick questionnaire    Fear of Current or Ex-Partner: No    Emotionally Abused: No    Physically Abused: No    Sexually Abused: No    Review of Systems  Constitutional:  Negative for fatigue.  Respiratory:  Negative for shortness of breath.   Psychiatric/Behavioral:  Positive for sleep disturbance.     Vitals:    07/30/22 0940  BP: 120/70  Pulse: 68  SpO2: 97%     Physical Exam Constitutional:      Appearance: He is obese.  HENT:     Mouth/Throat:     Mouth: Mucous membranes are moist.     Comments: Mallampati 4, crowded oropharynx Eyes:     Pupils: Pupils are equal, round, and reactive to light.  Cardiovascular:     Rate and Rhythm: Normal rate and regular rhythm.     Heart sounds: No murmur heard.    No friction rub.  Pulmonary:     Effort: No respiratory distress.     Breath sounds: No stridor. No wheezing or rhonchi.  Musculoskeletal:     Cervical back: No rigidity or tenderness.  Neurological:     Mental Status: He is alert.  Psychiatric:        Mood and Affect: Mood normal.       07/30/2022    9:00 AM  Results of the Epworth flowsheet  Sitting and reading 2  Watching TV 2  Sitting, inactive in a public place (e.g. a theatre or a meeting) 0  As a passenger in a car for an hour without a break 0  Lying down to rest in the afternoon when circumstances permit 0  Sitting and talking to someone 0  Sitting quietly after a lunch without alcohol 1  In a car, while stopped for a few minutes in traffic 0  Total score 5   Data Reviewed: Recent hospital records reviewed  Assessment:  History of obstructive sleep apnea  History of heart failure  History of atrial flutter  History of diabetes  Pathophysiology of sleep disordered breathing discussed Treatment options discussed    Plan/Recommendations: Schedule patient for an in lab polysomnogram  Treatment options for sleep apnea discussed with patient  Recently tolerated BiPAP well  in the hospital  Risk of not treating sleep disordered breathing discussed  Tentative follow-up in about 3 to 4 months   Sherrilyn Rist MD [facility] 07/30/2022, 10:00 AM  CC: Monico Blitz, MD

## 2022-07-30 NOTE — Patient Instructions (Signed)
Schedule for in lab sleep study  Encouraged regular exercise as tolerated  Follow-up in about 3 to 4 months  Call us with any significant concerns

## 2022-08-05 DIAGNOSIS — Z79899 Other long term (current) drug therapy: Secondary | ICD-10-CM | POA: Diagnosis not present

## 2022-08-05 DIAGNOSIS — Z0000 Encounter for general adult medical examination without abnormal findings: Secondary | ICD-10-CM | POA: Diagnosis not present

## 2022-08-05 DIAGNOSIS — E7800 Pure hypercholesterolemia, unspecified: Secondary | ICD-10-CM | POA: Diagnosis not present

## 2022-08-05 DIAGNOSIS — Z1339 Encounter for screening examination for other mental health and behavioral disorders: Secondary | ICD-10-CM | POA: Diagnosis not present

## 2022-08-05 DIAGNOSIS — Z125 Encounter for screening for malignant neoplasm of prostate: Secondary | ICD-10-CM | POA: Diagnosis not present

## 2022-08-05 DIAGNOSIS — Z7189 Other specified counseling: Secondary | ICD-10-CM | POA: Diagnosis not present

## 2022-08-05 DIAGNOSIS — I10 Essential (primary) hypertension: Secondary | ICD-10-CM | POA: Diagnosis not present

## 2022-08-05 DIAGNOSIS — Z1331 Encounter for screening for depression: Secondary | ICD-10-CM | POA: Diagnosis not present

## 2022-08-05 DIAGNOSIS — Z6837 Body mass index (BMI) 37.0-37.9, adult: Secondary | ICD-10-CM | POA: Diagnosis not present

## 2022-08-05 DIAGNOSIS — Z299 Encounter for prophylactic measures, unspecified: Secondary | ICD-10-CM | POA: Diagnosis not present

## 2022-08-05 DIAGNOSIS — R5383 Other fatigue: Secondary | ICD-10-CM | POA: Diagnosis not present

## 2022-08-06 ENCOUNTER — Ambulatory Visit (INDEPENDENT_AMBULATORY_CARE_PROVIDER_SITE_OTHER): Payer: Medicare PPO | Admitting: Physician Assistant

## 2022-08-06 DIAGNOSIS — R339 Retention of urine, unspecified: Secondary | ICD-10-CM | POA: Diagnosis not present

## 2022-08-06 DIAGNOSIS — Z978 Presence of other specified devices: Secondary | ICD-10-CM

## 2022-08-06 NOTE — Progress Notes (Signed)
Cath Change/ Replacement  Patient is present today for a catheter change due to urinary retention.  19m of water was removed from the balloon, a 16FR foley cath was removed without difficulty.  Patient was cleaned and prepped in a sterile fashion with betadine and 2% lidocaine jelly was instilled into the urethra. A 16 FR foley cath was replaced into the bladder, no complications were noted. Urine return was noted 368mand urine was yellow in color. The balloon was filled with 107mf sterile water. A leg bag was attached for drainage.  A night bag was also given to the patient and patient was given instruction on how to change from one bag to another. Patient was given proper instruction on catheter care.    Performed by: KouLevi AlandMA  Follow up: Follow up as scheduled.     Procedure reviewed. Agree with clinical documentation.  Julienne A Summerlin PA-C

## 2022-08-07 ENCOUNTER — Telehealth: Payer: Self-pay | Admitting: *Deleted

## 2022-08-07 NOTE — Telephone Encounter (Signed)
Patient called and stated that he was recently discharged from the hospital with low o2 levels and needed a Bipap but insurance would not get approved for one until he is scheduled for his sleep study.  Please call and advise patient

## 2022-08-18 DIAGNOSIS — I5042 Chronic combined systolic (congestive) and diastolic (congestive) heart failure: Secondary | ICD-10-CM | POA: Diagnosis not present

## 2022-08-18 DIAGNOSIS — N1832 Chronic kidney disease, stage 3b: Secondary | ICD-10-CM | POA: Diagnosis not present

## 2022-08-18 DIAGNOSIS — E782 Mixed hyperlipidemia: Secondary | ICD-10-CM | POA: Diagnosis not present

## 2022-08-18 DIAGNOSIS — I4819 Other persistent atrial fibrillation: Secondary | ICD-10-CM | POA: Diagnosis not present

## 2022-08-18 NOTE — Telephone Encounter (Signed)
Follow up to make sure study is scheduled

## 2022-08-18 NOTE — Telephone Encounter (Signed)
PCC's please reach out to this pt to set up sleep study, thank you.

## 2022-08-19 DIAGNOSIS — R338 Other retention of urine: Secondary | ICD-10-CM | POA: Diagnosis not present

## 2022-08-19 DIAGNOSIS — I9589 Other hypotension: Secondary | ICD-10-CM | POA: Diagnosis not present

## 2022-08-19 DIAGNOSIS — Z8679 Personal history of other diseases of the circulatory system: Secondary | ICD-10-CM | POA: Diagnosis not present

## 2022-08-19 DIAGNOSIS — D631 Anemia in chronic kidney disease: Secondary | ICD-10-CM | POA: Diagnosis not present

## 2022-08-19 DIAGNOSIS — R808 Other proteinuria: Secondary | ICD-10-CM | POA: Diagnosis not present

## 2022-08-19 DIAGNOSIS — N1832 Chronic kidney disease, stage 3b: Secondary | ICD-10-CM | POA: Diagnosis not present

## 2022-08-19 DIAGNOSIS — N170 Acute kidney failure with tubular necrosis: Secondary | ICD-10-CM | POA: Diagnosis not present

## 2022-08-19 DIAGNOSIS — E211 Secondary hyperparathyroidism, not elsewhere classified: Secondary | ICD-10-CM | POA: Diagnosis not present

## 2022-08-19 DIAGNOSIS — I129 Hypertensive chronic kidney disease with stage 1 through stage 4 chronic kidney disease, or unspecified chronic kidney disease: Secondary | ICD-10-CM | POA: Diagnosis not present

## 2022-08-20 NOTE — Telephone Encounter (Signed)
Waiting on auth

## 2022-08-22 NOTE — Telephone Encounter (Addendum)
Auth obtained & pt's scheduled for next available, 09/21/22.  Left detailed message with appt info on pt's VM.  Nothing further needed at this time.

## 2022-08-29 ENCOUNTER — Institutional Professional Consult (permissible substitution): Payer: Medicare PPO | Admitting: Internal Medicine

## 2022-09-03 ENCOUNTER — Ambulatory Visit (INDEPENDENT_AMBULATORY_CARE_PROVIDER_SITE_OTHER): Payer: Medicare PPO | Admitting: Physician Assistant

## 2022-09-03 DIAGNOSIS — R339 Retention of urine, unspecified: Secondary | ICD-10-CM | POA: Diagnosis not present

## 2022-09-03 NOTE — Progress Notes (Signed)
Cath Change/ Replacement  Patient is present today for a catheter change due to urinary retention.  60m of water was removed from the balloon, a 16FR foley cath was removed without difficulty.  Patient was cleaned and prepped in a sterile fashion with betadine and 2% lidocaine jelly was instilled into the urethra. A 16 FR foley cath was replaced into the bladder, no complications were noted. Urine return was noted 141mand urine was yellow in color. The balloon was filled with 1079mf sterile water. A leg bag was attached for drainage.  A night bag was also given to the patient and patient was given instruction on how to change from one bag to another. Patient was given proper instruction on catheter care.    Performed by: KouLevi AlandMA  Follow up: Follow up as scheduled.    Procedure reviewed. Agree with clinical documentation.  Julienne A Summerlin PA-C

## 2022-09-04 DIAGNOSIS — M25529 Pain in unspecified elbow: Secondary | ICD-10-CM | POA: Diagnosis not present

## 2022-09-04 DIAGNOSIS — Z6836 Body mass index (BMI) 36.0-36.9, adult: Secondary | ICD-10-CM | POA: Diagnosis not present

## 2022-09-04 DIAGNOSIS — E782 Mixed hyperlipidemia: Secondary | ICD-10-CM | POA: Diagnosis not present

## 2022-09-04 DIAGNOSIS — N183 Chronic kidney disease, stage 3 unspecified: Secondary | ICD-10-CM | POA: Diagnosis not present

## 2022-09-04 DIAGNOSIS — I5031 Acute diastolic (congestive) heart failure: Secondary | ICD-10-CM | POA: Diagnosis not present

## 2022-09-16 ENCOUNTER — Other Ambulatory Visit: Payer: Self-pay | Admitting: *Deleted

## 2022-09-16 DIAGNOSIS — D472 Monoclonal gammopathy: Secondary | ICD-10-CM

## 2022-09-16 NOTE — Addendum Note (Signed)
Addended by: Renda Rolls A on: 09/16/2022 10:47 AM   Modules accepted: Orders

## 2022-09-17 ENCOUNTER — Inpatient Hospital Stay: Payer: Medicare PPO

## 2022-09-18 DIAGNOSIS — I700 Atherosclerosis of aorta: Secondary | ICD-10-CM | POA: Diagnosis not present

## 2022-09-18 DIAGNOSIS — Z0000 Encounter for general adult medical examination without abnormal findings: Secondary | ICD-10-CM | POA: Diagnosis not present

## 2022-09-18 DIAGNOSIS — Z299 Encounter for prophylactic measures, unspecified: Secondary | ICD-10-CM | POA: Diagnosis not present

## 2022-09-18 DIAGNOSIS — K5730 Diverticulosis of large intestine without perforation or abscess without bleeding: Secondary | ICD-10-CM | POA: Diagnosis not present

## 2022-09-18 DIAGNOSIS — N184 Chronic kidney disease, stage 4 (severe): Secondary | ICD-10-CM | POA: Diagnosis not present

## 2022-09-18 DIAGNOSIS — R531 Weakness: Secondary | ICD-10-CM | POA: Diagnosis not present

## 2022-09-18 DIAGNOSIS — I4891 Unspecified atrial fibrillation: Secondary | ICD-10-CM | POA: Diagnosis not present

## 2022-09-18 DIAGNOSIS — N281 Cyst of kidney, acquired: Secondary | ICD-10-CM | POA: Diagnosis not present

## 2022-09-18 DIAGNOSIS — I10 Essential (primary) hypertension: Secondary | ICD-10-CM | POA: Diagnosis not present

## 2022-09-18 DIAGNOSIS — C61 Malignant neoplasm of prostate: Secondary | ICD-10-CM | POA: Diagnosis not present

## 2022-09-18 DIAGNOSIS — E119 Type 2 diabetes mellitus without complications: Secondary | ICD-10-CM | POA: Diagnosis not present

## 2022-09-18 DIAGNOSIS — Z6836 Body mass index (BMI) 36.0-36.9, adult: Secondary | ICD-10-CM | POA: Diagnosis not present

## 2022-09-18 DIAGNOSIS — K922 Gastrointestinal hemorrhage, unspecified: Secondary | ICD-10-CM | POA: Diagnosis not present

## 2022-09-18 DIAGNOSIS — R0689 Other abnormalities of breathing: Secondary | ICD-10-CM | POA: Diagnosis not present

## 2022-09-18 DIAGNOSIS — R400 Somnolence: Secondary | ICD-10-CM | POA: Diagnosis not present

## 2022-09-18 DIAGNOSIS — Z20822 Contact with and (suspected) exposure to covid-19: Secondary | ICD-10-CM | POA: Diagnosis not present

## 2022-09-18 DIAGNOSIS — I129 Hypertensive chronic kidney disease with stage 1 through stage 4 chronic kidney disease, or unspecified chronic kidney disease: Secondary | ICD-10-CM | POA: Diagnosis not present

## 2022-09-18 DIAGNOSIS — I959 Hypotension, unspecified: Secondary | ICD-10-CM | POA: Diagnosis not present

## 2022-09-18 DIAGNOSIS — Z743 Need for continuous supervision: Secondary | ICD-10-CM | POA: Diagnosis not present

## 2022-09-18 DIAGNOSIS — R4182 Altered mental status, unspecified: Secondary | ICD-10-CM | POA: Diagnosis not present

## 2022-09-18 DIAGNOSIS — E1165 Type 2 diabetes mellitus with hyperglycemia: Secondary | ICD-10-CM | POA: Diagnosis not present

## 2022-09-18 DIAGNOSIS — Z7984 Long term (current) use of oral hypoglycemic drugs: Secondary | ICD-10-CM | POA: Diagnosis not present

## 2022-09-18 DIAGNOSIS — N189 Chronic kidney disease, unspecified: Secondary | ICD-10-CM | POA: Diagnosis not present

## 2022-09-18 DIAGNOSIS — D649 Anemia, unspecified: Secondary | ICD-10-CM | POA: Diagnosis not present

## 2022-09-18 DIAGNOSIS — Z2821 Immunization not carried out because of patient refusal: Secondary | ICD-10-CM | POA: Diagnosis not present

## 2022-09-18 DIAGNOSIS — E785 Hyperlipidemia, unspecified: Secondary | ICD-10-CM | POA: Diagnosis not present

## 2022-09-19 DIAGNOSIS — Z89519 Acquired absence of unspecified leg below knee: Secondary | ICD-10-CM | POA: Diagnosis not present

## 2022-09-19 DIAGNOSIS — R0602 Shortness of breath: Secondary | ICD-10-CM | POA: Diagnosis not present

## 2022-09-19 DIAGNOSIS — Z7984 Long term (current) use of oral hypoglycemic drugs: Secondary | ICD-10-CM | POA: Diagnosis not present

## 2022-09-19 DIAGNOSIS — I130 Hypertensive heart and chronic kidney disease with heart failure and stage 1 through stage 4 chronic kidney disease, or unspecified chronic kidney disease: Secondary | ICD-10-CM | POA: Diagnosis not present

## 2022-09-19 DIAGNOSIS — K921 Melena: Secondary | ICD-10-CM | POA: Diagnosis not present

## 2022-09-19 DIAGNOSIS — I4819 Other persistent atrial fibrillation: Secondary | ICD-10-CM | POA: Diagnosis not present

## 2022-09-19 DIAGNOSIS — J9611 Chronic respiratory failure with hypoxia: Secondary | ICD-10-CM | POA: Diagnosis not present

## 2022-09-19 DIAGNOSIS — E119 Type 2 diabetes mellitus without complications: Secondary | ICD-10-CM | POA: Diagnosis not present

## 2022-09-19 DIAGNOSIS — K315 Obstruction of duodenum: Secondary | ICD-10-CM | POA: Diagnosis not present

## 2022-09-19 DIAGNOSIS — K259 Gastric ulcer, unspecified as acute or chronic, without hemorrhage or perforation: Secondary | ICD-10-CM | POA: Diagnosis not present

## 2022-09-19 DIAGNOSIS — I5032 Chronic diastolic (congestive) heart failure: Secondary | ICD-10-CM | POA: Diagnosis not present

## 2022-09-19 DIAGNOSIS — K5730 Diverticulosis of large intestine without perforation or abscess without bleeding: Secondary | ICD-10-CM | POA: Diagnosis not present

## 2022-09-19 DIAGNOSIS — J90 Pleural effusion, not elsewhere classified: Secondary | ICD-10-CM | POA: Diagnosis not present

## 2022-09-19 DIAGNOSIS — K5790 Diverticulosis of intestine, part unspecified, without perforation or abscess without bleeding: Secondary | ICD-10-CM | POA: Diagnosis not present

## 2022-09-19 DIAGNOSIS — K922 Gastrointestinal hemorrhage, unspecified: Secondary | ICD-10-CM | POA: Diagnosis not present

## 2022-09-19 DIAGNOSIS — C61 Malignant neoplasm of prostate: Secondary | ICD-10-CM | POA: Diagnosis not present

## 2022-09-19 DIAGNOSIS — K254 Chronic or unspecified gastric ulcer with hemorrhage: Secondary | ICD-10-CM | POA: Diagnosis not present

## 2022-09-19 DIAGNOSIS — J159 Unspecified bacterial pneumonia: Secondary | ICD-10-CM | POA: Diagnosis not present

## 2022-09-19 DIAGNOSIS — N189 Chronic kidney disease, unspecified: Secondary | ICD-10-CM | POA: Diagnosis not present

## 2022-09-19 DIAGNOSIS — E785 Hyperlipidemia, unspecified: Secondary | ICD-10-CM | POA: Diagnosis not present

## 2022-09-19 DIAGNOSIS — D649 Anemia, unspecified: Secondary | ICD-10-CM | POA: Diagnosis not present

## 2022-09-19 DIAGNOSIS — I129 Hypertensive chronic kidney disease with stage 1 through stage 4 chronic kidney disease, or unspecified chronic kidney disease: Secondary | ICD-10-CM | POA: Diagnosis not present

## 2022-09-19 DIAGNOSIS — K5731 Diverticulosis of large intestine without perforation or abscess with bleeding: Secondary | ICD-10-CM | POA: Diagnosis not present

## 2022-09-19 DIAGNOSIS — G4730 Sleep apnea, unspecified: Secondary | ICD-10-CM | POA: Diagnosis not present

## 2022-09-19 DIAGNOSIS — R197 Diarrhea, unspecified: Secondary | ICD-10-CM | POA: Diagnosis not present

## 2022-09-19 DIAGNOSIS — J9621 Acute and chronic respiratory failure with hypoxia: Secondary | ICD-10-CM | POA: Diagnosis not present

## 2022-09-19 DIAGNOSIS — D62 Acute posthemorrhagic anemia: Secondary | ICD-10-CM | POA: Diagnosis not present

## 2022-09-19 DIAGNOSIS — I10 Essential (primary) hypertension: Secondary | ICD-10-CM | POA: Diagnosis not present

## 2022-09-19 DIAGNOSIS — K5793 Diverticulitis of intestine, part unspecified, without perforation or abscess with bleeding: Secondary | ICD-10-CM | POA: Diagnosis not present

## 2022-09-19 DIAGNOSIS — I4892 Unspecified atrial flutter: Secondary | ICD-10-CM | POA: Diagnosis not present

## 2022-09-19 DIAGNOSIS — I4891 Unspecified atrial fibrillation: Secondary | ICD-10-CM | POA: Diagnosis not present

## 2022-09-19 DIAGNOSIS — J449 Chronic obstructive pulmonary disease, unspecified: Secondary | ICD-10-CM | POA: Diagnosis not present

## 2022-09-19 DIAGNOSIS — R918 Other nonspecific abnormal finding of lung field: Secondary | ICD-10-CM | POA: Diagnosis not present

## 2022-09-19 DIAGNOSIS — N1832 Chronic kidney disease, stage 3b: Secondary | ICD-10-CM | POA: Diagnosis not present

## 2022-09-19 DIAGNOSIS — K5733 Diverticulitis of large intestine without perforation or abscess with bleeding: Secondary | ICD-10-CM | POA: Diagnosis not present

## 2022-09-19 DIAGNOSIS — I700 Atherosclerosis of aorta: Secondary | ICD-10-CM | POA: Diagnosis not present

## 2022-09-21 ENCOUNTER — Ambulatory Visit (HOSPITAL_BASED_OUTPATIENT_CLINIC_OR_DEPARTMENT_OTHER): Payer: Medicare PPO | Attending: Pulmonary Disease | Admitting: Pulmonary Disease

## 2022-09-24 ENCOUNTER — Inpatient Hospital Stay: Payer: Medicare PPO | Admitting: Physician Assistant

## 2022-09-24 ENCOUNTER — Inpatient Hospital Stay: Payer: Medicare PPO | Admitting: Hematology

## 2022-10-02 ENCOUNTER — Ambulatory Visit: Payer: Medicare PPO

## 2022-10-07 ENCOUNTER — Ambulatory Visit: Payer: Medicare PPO

## 2022-10-10 DIAGNOSIS — E1165 Type 2 diabetes mellitus with hyperglycemia: Secondary | ICD-10-CM | POA: Diagnosis not present

## 2022-10-10 DIAGNOSIS — I10 Essential (primary) hypertension: Secondary | ICD-10-CM | POA: Diagnosis not present

## 2022-10-10 DIAGNOSIS — I4891 Unspecified atrial fibrillation: Secondary | ICD-10-CM | POA: Diagnosis not present

## 2022-10-10 DIAGNOSIS — Z299 Encounter for prophylactic measures, unspecified: Secondary | ICD-10-CM | POA: Diagnosis not present

## 2022-10-10 DIAGNOSIS — Z6836 Body mass index (BMI) 36.0-36.9, adult: Secondary | ICD-10-CM | POA: Diagnosis not present

## 2022-10-10 DIAGNOSIS — G546 Phantom limb syndrome with pain: Secondary | ICD-10-CM | POA: Diagnosis not present

## 2022-10-12 DIAGNOSIS — I469 Cardiac arrest, cause unspecified: Secondary | ICD-10-CM | POA: Diagnosis not present

## 2022-10-20 NOTE — Progress Notes (Unsigned)
[name] [name], [place] [id number]   CLINIC:  Medical Oncology/Hematology  PCP:  Monico Blitz, [place] [address] [phone]   REASON FOR VISIT:  Follow-up for ***  PRIOR THERAPY: ***  CURRENT THERAPY: ***  INTERVAL HISTORY:  [name] [name] 77 y.o. male returns for routine follow-up of ***  At today's visit, he reports feeling ***.  ***recent hospitalization at [facility] for ABLA   *** No fatigue, fever, chills, shortness of breath, cough, chest pain, nausea, vomiting, abdominal pain.  Denies any signs or symptoms of blood loss.  No current signs or symptoms of blood clots.  He has ***% energy and ***% appetite. He endorses that he is maintaining a stable weight.    REVIEW OF SYSTEMS:  Review of Systems - Oncology    PAST MEDICAL/SURGICAL HISTORY:  Past Medical History:  Diagnosis Date   Anemia    Anxiety    Cancer ([place])    Prostate- Stage I   Critical lower limb ischemia ([facility])    Essential hypertension    Gangrene of foot [facility]) March 2016   Left foot   Hyperlipidemia    Neuropathy    Type 2 diabetes mellitus ([place])    Past Surgical History:  Procedure Laterality Date   AMPUTATION Left 01/12/2015   Procedure: AMPUTATION BELOW KNEE;  Surgeon: Leandrew Koyanagi, MD;  Location: [place];  Service: Orthopedics;  Laterality: Left;   APPLICATION OF WOUND VAC Right 01/12/2015   Procedure: APPLICATION OF WOUND VAC;  Surgeon: Leandrew Koyanagi, MD;  Location: [place];  Service: Orthopedics;  Laterality: Right;   CARDIAC ELECTROPHYSIOLOGY STUDY AND ABLATION     CHOLECYSTECTOMY     ORIF HUMERUS FRACTURE       SOCIAL HISTORY:  Social History   Socioeconomic History   Marital status: Single    Spouse name: Not on file   Number of children: 0   Years of education: Not on file   Highest education level: Not on file  Occupational History   Occupation: RETIRED  Tobacco Use   Smoking status: Never   Smokeless tobacco: Never  Vaping  Use   Vaping Use: Never used  Substance and Sexual Activity   Alcohol use: Yes    Alcohol/week: 0.0 standard drinks of alcohol    Comment: rare   Drug use: No   Sexual activity: Not Currently  Other Topics Concern   Not on file  Social History Narrative   Not on file   Social Determinants of Health   Financial Resource Strain: Medium Risk (03/13/2020)   Overall Financial Resource Strain (CARDIA)    Difficulty of Paying Living Expenses: Somewhat hard  Food Insecurity: Food Insecurity Present (03/13/2020)   Hunger Vital Sign    Worried About Running Out of Food in the Last Year: Sometimes true    Ran Out of Food in the Last Year: Sometimes true  Transportation Needs: Unmet Transportation Needs (03/13/2020)   PRAPARE - Transportation    Lack of Transportation (Medical): Yes    Lack of Transportation (Non-Medical): Yes  Physical Activity: Inactive (03/13/2020)   Exercise Vital Sign    Days of Exercise per Week: 0 days    Minutes of Exercise per Session: 0 min  Stress: No Stress Concern Present (03/13/2020)   [place]    Feeling of Stress : Not at all  Social Connections: Socially Isolated (03/13/2020)   Social Connection  and Isolation Panel [NHANES]    Frequency of Communication with Friends and Family: More than three times a week    Frequency of Social Gatherings with Friends and Family: Twice a week    Attends Religious Services: Never    Marine scientist or Organizations: No    Attends Archivist Meetings: Never    Marital Status: Never married  Intimate Partner Violence: Not At Risk (03/13/2020)   Humiliation, Afraid, Rape, and Kick questionnaire    Fear of Current or Ex-Partner: No    Emotionally Abused: No    Physically Abused: No    Sexually Abused: No    FAMILY HISTORY:  Family History  Problem Relation Age of Onset   Hypertension Father        and kidney failure   Heart disease  Father    Heart attack Father    Kidney disease Father    Alzheimer's disease Mother    Heart attack Maternal Grandfather    Heart attack Paternal Grandfather    Colon cancer Paternal Aunt 72       s/p surgical rsxn; lived to 77 yrs old   Hypertension Sister    Hypercholesterolemia Sister    Cervical cancer Sister    Gastric cancer Neg Hx    Esophageal cancer Neg Hx     CURRENT MEDICATIONS:  Outpatient Encounter Medications as of 10/21/2022  Medication Sig Note   Alcohol Swabs (GLOBAL ALCOHOL PREP EASE) 70 % PADS SMARTSIG:1 swab Topical 3 Times Daily    Alcohol Swabs (GLOBAL ALCOHOL PREP EASE) 70 % PADS Apply topically.    alprazolam (XANAX) 2 MG tablet Take by mouth.    ascorbic acid (VITAMIN C) 1000 MG tablet Take 1,000 mg by mouth daily in the afternoon.     aspirin EC 81 MG tablet Take 1 tablet by mouth daily.    Blood Glucose Monitoring Suppl (ACCU-CHEK GUIDE ME) w/Device KIT     cadexomer iodine (IODOSORB) 0.9 % gel Apply 1 application  topically daily as needed for wound care.    Calcium Carbonate (CALCIUM 600 PO) Take 600 mg by mouth daily in the afternoon.    chlorthalidone (HYGROTON) 25 MG tablet Take 1 tablet by mouth daily.    Cholecalciferol (VITAMIN D3) 10 MCG (400 UNIT) tablet Take by mouth.    Docusate Sodium (DSS) 100 MG CAPS Take by mouth.    empagliflozin (JARDIANCE) 10 MG TABS tablet Take 1 tablet by mouth daily.    finasteride (PROSCAR) 5 MG tablet Take 5 mg by mouth daily in the afternoon.     folic acid (FOLVITE) 334 MCG tablet Take by mouth.    furosemide (LASIX) 40 MG tablet Take 40 mg by mouth 2 (two) times daily.    glimepiride (AMARYL) 4 MG tablet Take 4 mg by mouth daily in the afternoon.     hydrALAZINE (APRESOLINE) 50 MG tablet Take 50 mg by mouth 2 (two) times daily.    insulin detemir (LEVEMIR FLEXPEN) 100 UNIT/ML FlexPen Inject into the skin.    LEVEMIR FLEXPEN 100 UNIT/ML FlexPen SMARTSIG:20 Unit(s) SUB-Q Twice Daily    metFORMIN (GLUCOPHAGE) 500  MG tablet Take 1 tablet by mouth daily with breakfast.    metoprolol succinate (TOPROL-XL) 50 MG 24 hr tablet Take 50 mg by mouth daily.    Multiple Vitamins-Minerals (MULTIVITAMIN WITH MINERALS) tablet Take 1 tablet by mouth daily in the afternoon.     Omega-3 Fatty Acids (FISH OIL) 1200 MG CAPS Take 1,200  mg by mouth daily in the afternoon.    ONETOUCH VERIO test strip 1 each daily.    potassium chloride SA (KLOR-CON M) 20 MEQ tablet TAKE 1 TABLET BY MOUTH EVERY DAY, DO not crush OR chew    pregabalin (LYRICA) 50 MG capsule Take by mouth.    sildenafil (REVATIO) 20 MG tablet Take by mouth.    simvastatin (ZOCOR) 40 MG tablet Take 40 mg by mouth daily in the afternoon.     tamsulosin (FLOMAX) 0.4 MG CAPS capsule Take 0.4 mg by mouth daily in the afternoon.     trandolapril (MAVIK) 4 MG tablet Take by mouth.    traZODone (DESYREL) 50 MG tablet Take by mouth.    vitamin E 400 UNIT capsule Take 400 Units by mouth daily. 03/09/2020: On hold until patient purchases more   No facility-administered encounter medications on file as of 10/21/2022.    ALLERGIES:  No Known Allergies   PHYSICAL EXAM:  ECOG PERFORMANCE STATUS: {CHL ONC ECOG PS:[id number]}  There were no vitals filed for this visit. There were no vitals filed for this visit. Physical Exam   LABORATORY DATA:  I have reviewed the labs as listed.  CBC    Component Value Date/Time   WBC 11.4 (H) 05/01/2022 1217   RBC 4.53 05/01/2022 1217   HGB 13.5 05/01/2022 1217   HCT 40.8 05/01/2022 1217   PLT 246 05/01/2022 1217   MCV 90.1 05/01/2022 1217   MCH 29.8 05/01/2022 1217   MCHC 33.1 05/01/2022 1217   RDW 14.8 05/01/2022 1217   LYMPHSABS 2.5 05/01/2022 1217   MONOABS 1.0 05/01/2022 1217   EOSABS 0.4 05/01/2022 1217   BASOSABS 0.1 05/01/2022 1217      Latest Ref Rng & Units 05/01/2022   12:17 PM 10/30/2021    1:43 PM 02/05/2021    1:16 PM  CMP  Glucose 70 - 99 mg/dL 312  236  122   BUN 8 - 23 mg/dL 42  51  27    Creatinine 0.61 - 1.24 mg/dL 2.52  2.04  1.60   Sodium 135 - 145 mmol/L 133  135  139   Potassium 3.5 - 5.1 mmol/L 3.8  3.7  3.7   Chloride 98 - 111 mmol/L 98  96  98   CO2 22 - 32 mmol/L _0 Calcium 8.9 - 10.3 mg/dL 8.8  9.5  8.8   Total Protein 6.5 - 8.1 g/dL 6.7  7.2  6.8   Total Bilirubin 0.3 - 1.2 mg/dL 0.5  0.8  0.6   Alkaline Phos 38 - 126 U/L 56  62  56   AST 15 - 41 U/L _1 ALT 0 - 44 U/L _2 DIAGNOSTIC IMAGING:  I have independently reviewed the relevant imaging and discussed with the patient.  ASSESSMENT & PLAN: 1.  Elevated free light chains/MGUS -Patient seen at the request of Dr. Theador Hawthorne for further work-up of monoclonal gammopathy.  UPEP on 11/25/2019 showed poorly defined area of restricted protein mobility reactive to IgG and kappa antisera. -Labs from 03/13/2020 shows M spike is not seen.  Free light chain ratio is elevated at 3.31.  Kappa light chain 67.6, lambda light chains 20.4.  Immunofixation was unremarkable. -Skeletal survey on 03/13/2020 with no lucent lesions of myeloma. - *** - He was taking Lasix 40 mg 4 times daily but has run out of the prescription  in the last few days. - Reviewed labs from 05/01/2022 which showed creatinine 2.52 and gradually is increasing.  CBC was grossly normal although hemoglobin decreased to 13.5 from 15.7. - Kappa light chains are 106, up from 92.  Free light chain ratio is 3.95.  This goes along with elevated creatinine. - As he is feeling weak, I have recommended Feraheme x2 as his hemoglobin dropped by 2 points. - Recommend follow-up in 4 months with repeat labs including ferritin iron panel, myeloma labs.  - PLAN: ***  2.  Normocytic anemia - *** - Received Venofer 300 mg x 3 from 05/15/2022 through 06/13/2022 *** - Hospitalized for acute blood loss anemia *** -*** - PLAN: ***  ***.  CKD -CKD stage IIIb since 2016 secondary to diabetes and hypertension.  He has nephrotic range  proteinuria. -Ultrasound of the kidneys on 12/06/2019 shows bilateral renal cyst with no obstructing lesions.  Renal cortical thickness and renal echogenicity normal bilaterally. -Kidney biopsy on 03/14/2020 consistent with arteriosclerosis with arterionephrosclerosis and FSGS - *** - Creatinine is 2.52 and slightly worse.  Continue follow-up with Dr. Theador Hawthorne.  - PLAN: ***   ***.  Prostate cancer - *** - Continue follow-up with Dr. Blair Promise at [facility] and is under watchful waiting.  - PLAN: ***    PLAN SUMMARY & DISPOSITION: ***  All questions were answered. The patient knows to call the clinic with any problems, questions or concerns.  Medical decision making: ***  Time spent on visit: I spent *** minutes counseling the patient face to face. The total time spent in the appointment was *** minutes and more than 50% was on counseling.   Harriett Rush, PA-C  ***

## 2022-10-20 DEATH — deceased

## 2022-10-21 ENCOUNTER — Inpatient Hospital Stay: Payer: Medicare PPO | Admitting: Physician Assistant

## 2022-10-21 ENCOUNTER — Encounter: Payer: Self-pay | Admitting: *Deleted

## 2022-10-21 ENCOUNTER — Other Ambulatory Visit: Payer: Self-pay

## 2022-10-21 DIAGNOSIS — D472 Monoclonal gammopathy: Secondary | ICD-10-CM

## 2022-10-22 ENCOUNTER — Encounter: Payer: Self-pay | Admitting: *Deleted

## 2022-10-22 NOTE — Progress Notes (Signed)
Have been attempting to contact patient by phone and [facility] to call and schedule labs and follow up appointments.  No response at this point.

## 2022-12-17 ENCOUNTER — Ambulatory Visit: Payer: Medicare PPO | Admitting: Urology
